# Patient Record
Sex: Female | Born: 1955
Health system: Southern US, Community
[De-identification: ages and names within clinical notes are randomized; demographics above are authoritative.]

## PROBLEM LIST (undated history)

## (undated) DIAGNOSIS — Z87828 Personal history of other (healed) physical injury and trauma: Secondary | ICD-10-CM

## (undated) DIAGNOSIS — E079 Disorder of thyroid, unspecified: Secondary | ICD-10-CM

## (undated) DIAGNOSIS — J9819 Other pulmonary collapse: Secondary | ICD-10-CM

## (undated) DIAGNOSIS — M25519 Pain in unspecified shoulder: Secondary | ICD-10-CM

## (undated) DIAGNOSIS — I10 Essential (primary) hypertension: Secondary | ICD-10-CM

## (undated) DIAGNOSIS — E119 Type 2 diabetes mellitus without complications: Secondary | ICD-10-CM

## (undated) DIAGNOSIS — Z972 Presence of dental prosthetic device (complete) (partial): Secondary | ICD-10-CM

## (undated) DIAGNOSIS — M1990 Unspecified osteoarthritis, unspecified site: Secondary | ICD-10-CM

## (undated) DIAGNOSIS — M545 Low back pain, unspecified: Secondary | ICD-10-CM

## (undated) DIAGNOSIS — K219 Gastro-esophageal reflux disease without esophagitis: Secondary | ICD-10-CM

## (undated) DIAGNOSIS — H269 Unspecified cataract: Secondary | ICD-10-CM

## (undated) DIAGNOSIS — E785 Hyperlipidemia, unspecified: Secondary | ICD-10-CM

## (undated) HISTORY — DX: Essential (primary) hypertension: I10

## (undated) HISTORY — DX: Low back pain, unspecified: M54.50

## (undated) HISTORY — PX: EYE SURGERY: SHX253

## (undated) HISTORY — DX: Personal history of other (healed) physical injury and trauma: Z87.828

## (undated) HISTORY — DX: Hyperlipidemia, unspecified: E78.5

## (undated) HISTORY — DX: Disorder of thyroid, unspecified: E07.9

## (undated) HISTORY — DX: Unspecified cataract: H26.9

## (undated) HISTORY — DX: Low back pain: M54.5

## (undated) HISTORY — PX: THYROID SURGERY: SHX805

## (undated) HISTORY — DX: Pain in unspecified shoulder: M25.519

## (undated) HISTORY — DX: Type 2 diabetes mellitus without complications: E11.9

## (undated) HISTORY — PX: BLADDER SURGERY: SHX569

## (undated) HISTORY — PX: CATARACT EXTRACTION, BILATERAL: SHX1313

## (undated) HISTORY — DX: Other pulmonary collapse: J98.19

---

## 2000-07-25 ENCOUNTER — Encounter: Payer: Self-pay | Admitting: Family Medicine

## 2004-12-01 ENCOUNTER — Ambulatory Visit: Payer: Self-pay | Admitting: Gastroenterology

## 2008-04-10 ENCOUNTER — Ambulatory Visit: Payer: Self-pay | Admitting: General Surgery

## 2009-08-06 ENCOUNTER — Ambulatory Visit: Payer: Self-pay | Admitting: Family Medicine

## 2009-09-07 ENCOUNTER — Ambulatory Visit: Payer: Self-pay | Admitting: Diagnostic Radiology

## 2009-09-07 ENCOUNTER — Emergency Department (HOSPITAL_BASED_OUTPATIENT_CLINIC_OR_DEPARTMENT_OTHER): Admission: EM | Admit: 2009-09-07 | Discharge: 2009-09-07 | Payer: Self-pay | Admitting: Emergency Medicine

## 2010-05-22 LAB — POCT CARDIAC MARKERS
Myoglobin, poc: 74.6 ng/mL (ref 12–200)
Troponin i, poc: <0.05 ng/mL (ref 0.00–0.09)

## 2010-05-22 LAB — BASIC METABOLIC PANEL
Calcium: 9 mg/dL (ref 8.4–10.5)
Creatinine, Ser: 0.6 mg/dL (ref 0.4–1.2)
GFR calc Af Amer: >60 mL/min (ref >60)
Glucose, Bld: 89 mg/dL (ref 70–99)
Potassium: 3.6 mEq/L (ref 3.5–5.1)
Sodium: 143 mEq/L (ref 135–145)

## 2010-05-22 LAB — CBC
HCT: 40.8 % (ref 36.0–46.0)
Hemoglobin: 13.4 g/dL (ref 12.0–15.0)
MCH: 28.4 pg (ref 26.0–34.0)
Platelets: 345 10*3/uL (ref 150–400)
RDW: 12.7 % (ref 11.5–15.5)

## 2010-05-22 LAB — DIFFERENTIAL
Basophils Absolute: 0.2 10*3/uL — ABNORMAL HIGH (ref 0.0–0.1)
Eosinophils Relative: 3 % (ref 0–5)
Lymphs Abs: 2.7 10*3/uL (ref 0.7–4.0)
Monocytes Relative: 7 % (ref 3–12)
Neutro Abs: 4.1 10*3/uL (ref 1.7–7.7)
Neutrophils Relative %: 53 % (ref 43–77)

## 2010-05-22 LAB — GLUCOSE, CAPILLARY: Glucose-Capillary: 71 mg/dL (ref 70–99)

## 2011-05-02 ENCOUNTER — Ambulatory Visit: Payer: Self-pay | Admitting: Family Medicine

## 2011-12-15 ENCOUNTER — Emergency Department: Payer: Self-pay | Admitting: Emergency Medicine

## 2011-12-16 LAB — COMPREHENSIVE METABOLIC PANEL
Alkaline Phosphatase: 99 U/L (ref 50–136)
BUN: 14 mg/dL (ref 7–18)
Bilirubin,Total: 0.3 mg/dL (ref 0.2–1.0)
Co2: 25 mmol/L (ref 21–32)
Creatinine: 0.61 mg/dL (ref 0.60–1.30)
EGFR (Non-African Amer.): >60
SGPT (ALT): 47 U/L (ref 12–78)
Total Protein: 7.6 g/dL (ref 6.4–8.2)

## 2011-12-16 LAB — CBC WITH DIFFERENTIAL/PLATELET
Basophil #: 0.1 10*3/uL (ref 0.0–0.1)
Basophil %: 0.6 %
HCT: 38.2 % (ref 35.0–47.0)
HGB: 12.6 g/dL (ref 12.0–16.0)
Lymphocyte #: 2.1 10*3/uL (ref 1.0–3.6)
Lymphocyte %: 9.3 %
MCV: 86 fL (ref 80–100)
Monocyte %: 4.3 %
Neutrophil #: 18.9 10*3/uL — ABNORMAL HIGH (ref 1.4–6.5)
RDW: 13.6 % (ref 11.5–14.5)
WBC: 22.1 10*3/uL — ABNORMAL HIGH (ref 3.6–11.0)

## 2012-01-03 ENCOUNTER — Encounter: Payer: Self-pay | Admitting: Internal Medicine

## 2012-01-05 ENCOUNTER — Encounter: Payer: Self-pay | Admitting: Internal Medicine

## 2012-03-28 ENCOUNTER — Encounter: Payer: Self-pay | Admitting: Neurosurgery

## 2012-06-19 DIAGNOSIS — M5416 Radiculopathy, lumbar region: Secondary | ICD-10-CM | POA: Insufficient documentation

## 2012-06-19 DIAGNOSIS — M5116 Intervertebral disc disorders with radiculopathy, lumbar region: Secondary | ICD-10-CM | POA: Insufficient documentation

## 2013-06-04 ENCOUNTER — Ambulatory Visit: Payer: Self-pay | Admitting: Family Medicine

## 2013-09-03 ENCOUNTER — Encounter: Payer: Self-pay | Admitting: *Deleted

## 2013-09-11 ENCOUNTER — Ambulatory Visit (INDEPENDENT_AMBULATORY_CARE_PROVIDER_SITE_OTHER): Payer: 59 | Admitting: Cardiovascular Disease

## 2013-09-11 ENCOUNTER — Encounter: Payer: Self-pay | Admitting: Cardiovascular Disease

## 2013-09-11 ENCOUNTER — Encounter (INDEPENDENT_AMBULATORY_CARE_PROVIDER_SITE_OTHER): Payer: Self-pay

## 2013-09-11 VITALS — BP 130/78 | HR 104 | Ht 62.0 in | Wt 264.2 lb

## 2013-09-11 DIAGNOSIS — E785 Hyperlipidemia, unspecified: Secondary | ICD-10-CM

## 2013-09-11 DIAGNOSIS — R0602 Shortness of breath: Secondary | ICD-10-CM

## 2013-09-11 DIAGNOSIS — R0989 Other specified symptoms and signs involving the circulatory and respiratory systems: Secondary | ICD-10-CM

## 2013-09-11 DIAGNOSIS — I10 Essential (primary) hypertension: Secondary | ICD-10-CM | POA: Insufficient documentation

## 2013-09-11 DIAGNOSIS — R0609 Other forms of dyspnea: Secondary | ICD-10-CM

## 2013-09-11 NOTE — Assessment & Plan Note (Signed)
Blood pressure is reasonably controlled. However, I will consider changes as outlined above.
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
## 2013-09-11 NOTE — Assessment & Plan Note (Signed)
It seems that she had issues with statins in the past. She might benefit from a trial of a different statin given history of diabetes and other risk factors.

## 2013-09-11 NOTE — Patient Instructions (Addendum)
Chelsea Gomez  Your caregiver has ordered a Stress Test with nuclear imaging. The purpose of this test is to evaluate the blood supply to your heart muscle. This procedure is referred to as a "Non-Invasive Stress Test." This is because other than having an IV started in your vein, nothing is inserted or "invades" your body. Cardiac stress tests are done to find areas of poor blood flow to the heart by determining the extent of coronary artery disease (CAD). Some patients exercise on a treadmill, which naturally increases the blood flow to your heart, while others who are  unable to walk on a treadmill due to physical limitations have a pharmacologic/chemical stress agent called Lexiscan . This medicine will mimic walking on a treadmill by temporarily increasing your coronary blood flow.   Please note: these test may take anywhere between 2-4 hours to complete  PLEASE REPORT TO [facility] AT THE FIRST DESK WILL DIRECT YOU WHERE TO GO  Date of Procedure:___________7/13/15__________________________  Arrival Time for Procedure:_______0815 am_______________________  Instructions regarding medication:   __x__ : Hold diabetes medication morning of procedure   PLEASE NOTIFY THE OFFICE AT LEAST 24 HOURS IN ADVANCE IF YOU ARE UNABLE TO KEEP YOUR APPOINTMENT.  [phone] AND  PLEASE NOTIFY NUCLEAR MEDICINE AT [facility] AT LEAST 24 HOURS IN ADVANCE IF YOU ARE UNABLE TO KEEP YOUR APPOINTMENT. [phone]  How to prepare for your Myoview test:  1. Do not eat or drink after midnight 2. No caffeine for 24 hours prior to test 3. No smoking 24 hours prior to test. 4. Your medication may be taken with water.  If your doctor stopped a medication because of this test, do not take that medication. 5. Ladies, please do not wear dresses.  Skirts or pants are appropriate. Please wear a short sleeve shirt. 6. No perfume, cologne or lotion. 7. Wear comfortable walking shoes. No  heels!    Your physician has requested that you have an echocardiogram. Echocardiography is a painless test that uses sound waves to create images of your heart. It provides your doctor with information about the size and shape of your heart and how well your heart's chambers and valves are working. This procedure takes approximately one hour. There are no restrictions for this procedure.   Your physician recommends that you schedule a follow-up appointment in:  After your tests

## 2013-09-11 NOTE — Assessment & Plan Note (Signed)
The patient is having dyspnea with minimal activities which is worrisome for angina equivalent given her multiple risk factors for coronary artery disease including diabetes mellitus. Baseline ECG is abnormal with sinus tachycardia and nonspecific T wave changes in the inferior leads. I recommend further ischemic evaluation with a pharmacologic nuclear stress. The other possibility could be diastolic heart failure. I requested an echocardiogram to evaluate diastolic function and pulmonary pressure. She might benefit from treatment with a beta blocker to improve sinus tachycardia. It might consider adding small dose carvedilol and decreasing amlodipine given her lower extremity edema.

## 2013-09-15 ENCOUNTER — Ambulatory Visit: Payer: Self-pay | Admitting: Cardiovascular Disease

## 2013-09-15 ENCOUNTER — Encounter: Payer: Self-pay | Admitting: Cardiovascular Disease

## 2013-09-15 DIAGNOSIS — R0609 Other forms of dyspnea: Secondary | ICD-10-CM

## 2013-09-15 DIAGNOSIS — R0989 Other specified symptoms and signs involving the circulatory and respiratory systems: Secondary | ICD-10-CM

## 2013-10-02 ENCOUNTER — Other Ambulatory Visit: Payer: Self-pay

## 2013-10-02 ENCOUNTER — Other Ambulatory Visit (INDEPENDENT_AMBULATORY_CARE_PROVIDER_SITE_OTHER): Payer: 59

## 2013-10-02 DIAGNOSIS — R0602 Shortness of breath: Secondary | ICD-10-CM

## 2013-10-13 ENCOUNTER — Encounter: Payer: Self-pay | Admitting: Cardiovascular Disease

## 2013-10-13 ENCOUNTER — Ambulatory Visit (INDEPENDENT_AMBULATORY_CARE_PROVIDER_SITE_OTHER): Payer: 59 | Admitting: Cardiovascular Disease

## 2013-10-13 VITALS — BP 141/84 | HR 99 | Ht 62.0 in | Wt 260.2 lb

## 2013-10-13 DIAGNOSIS — R0989 Other specified symptoms and signs involving the circulatory and respiratory systems: Secondary | ICD-10-CM

## 2013-10-13 DIAGNOSIS — R0609 Other forms of dyspnea: Secondary | ICD-10-CM

## 2013-10-13 DIAGNOSIS — I10 Essential (primary) hypertension: Secondary | ICD-10-CM

## 2013-10-13 MED ORDER — CARVEDILOL 6.25 MG PO TABS: 6.2500 mg | ORAL_TABLET | Freq: Two times a day (BID) | ORAL | Status: DC

## 2013-10-13 MED ORDER — AMLODIPINE BESYLATE 5 MG PO TABS: 5.0000 mg | ORAL_TABLET | Freq: Every day | ORAL | Status: DC

## 2013-10-13 NOTE — Patient Instructions (Signed)
Your physician has recommended you make the following change in your medication:  Decrease Amlodipine to 5 mg once daily  Start Carvedilol 6.25 mg twice daily   Your physician recommends that you schedule a follow-up appointment in:  3 months with Dr. Fletcher Anon

## 2013-10-13 NOTE — Assessment & Plan Note (Signed)
No evidence of ischemia on stress test and no evidence of structural cardiac abnormalities on echo. I suspect that her exertional dyspnea is likely multifactorial due to physical deconditioning, obesity and mild diastolic heart failure. I had a prolonged discussion with her about the importance of starting an exercise program and trying to lose weight. Evaluation for sleep apnea or so should be considered.

## 2013-10-13 NOTE — Assessment & Plan Note (Signed)
Given mild diastolic dysfunction on echo, she might benefit from slowing resting heart rate. Thus, I added small dose carvedilol 6.25 mg twice daily and decrease amlodipine to 5 mg once daily.
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
## 2014-01-13 ENCOUNTER — Ambulatory Visit: Payer: 59 | Admitting: Cardiovascular Disease

## 2014-01-21 ENCOUNTER — Ambulatory Visit (INDEPENDENT_AMBULATORY_CARE_PROVIDER_SITE_OTHER): Payer: 59 | Admitting: Cardiovascular Disease

## 2014-01-21 ENCOUNTER — Encounter: Payer: Self-pay | Admitting: Cardiovascular Disease

## 2014-01-21 VITALS — BP 156/90 | HR 100 | Ht 62.0 in | Wt 260.8 lb

## 2014-01-21 DIAGNOSIS — R0609 Other forms of dyspnea: Secondary | ICD-10-CM

## 2014-01-21 DIAGNOSIS — I10 Essential (primary) hypertension: Secondary | ICD-10-CM

## 2014-01-21 NOTE — Patient Instructions (Signed)
Continue same medications.   Your physician wants you to follow-up in: 6 months.  You will receive a reminder letter in the mail two months in advance. If you don't receive a letter, please call our office to schedule the follow-up appointment.  
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
## 2014-01-21 NOTE — Assessment & Plan Note (Signed)
Likely multifactorial due to physical deconditioning, obesity and mild diastolic heart failure. She reports improvement after the addition of carvedilol. I advised her to continue with weight loss and regular exercise.

## 2014-01-21 NOTE — Assessment & Plan Note (Signed)
Blood pressure is elevated today but she did not take her medications this morning. Continue to monitor.

## 2014-01-26 ENCOUNTER — Ambulatory Visit: Payer: 59 | Admitting: Cardiovascular Disease

## 2014-03-06 HISTORY — PX: TOOTH EXTRACTION: SUR596

## 2014-08-14 ENCOUNTER — Ambulatory Visit (INDEPENDENT_AMBULATORY_CARE_PROVIDER_SITE_OTHER): Payer: 59 | Admitting: Family Medicine

## 2014-08-14 ENCOUNTER — Encounter: Payer: Self-pay | Admitting: Family Medicine

## 2014-08-14 VITALS — BP 104/56 | HR 60 | Temp 98.2°F | Resp 16 | Ht 62.0 in | Wt 258.4 lb

## 2014-08-14 DIAGNOSIS — K219 Gastro-esophageal reflux disease without esophagitis: Secondary | ICD-10-CM | POA: Diagnosis not present

## 2014-08-14 DIAGNOSIS — I10 Essential (primary) hypertension: Secondary | ICD-10-CM | POA: Diagnosis not present

## 2014-08-14 DIAGNOSIS — E1165 Type 2 diabetes mellitus with hyperglycemia: Secondary | ICD-10-CM | POA: Diagnosis not present

## 2014-08-14 DIAGNOSIS — IMO0002 Reserved for concepts with insufficient information to code with codable children: Secondary | ICD-10-CM

## 2014-08-14 DIAGNOSIS — E785 Hyperlipidemia, unspecified: Secondary | ICD-10-CM

## 2014-08-14 LAB — POCT GLYCOSYLATED HEMOGLOBIN (HGB A1C): Hemoglobin A1C: 7.9

## 2014-08-14 MED ORDER — ATORVASTATIN CALCIUM 40 MG PO TABS: 40.0000 mg | ORAL_TABLET | Freq: Every day | ORAL | Status: DC
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
## 2014-08-14 NOTE — Patient Instructions (Addendum)
Try elevating the Head of your bed on 6 inch blocks. Stop Zetia Try to walk daily and improve your endurance Look up Byetta on the internet

## 2014-10-07 ENCOUNTER — Other Ambulatory Visit: Payer: Self-pay | Admitting: Family Medicine

## 2014-10-07 DIAGNOSIS — I10 Essential (primary) hypertension: Secondary | ICD-10-CM

## 2014-11-12 ENCOUNTER — Other Ambulatory Visit: Payer: Self-pay | Admitting: Family Medicine

## 2014-11-12 DIAGNOSIS — E119 Type 2 diabetes mellitus without complications: Secondary | ICD-10-CM

## 2014-11-12 MED ORDER — JANUVIA 100 MG PO TABS: 100.0000 mg | ORAL_TABLET | Freq: Every day | ORAL | Status: DC

## 2014-11-13 DIAGNOSIS — R0681 Apnea, not elsewhere classified: Secondary | ICD-10-CM | POA: Insufficient documentation

## 2014-11-13 DIAGNOSIS — E1165 Type 2 diabetes mellitus with hyperglycemia: Secondary | ICD-10-CM | POA: Insufficient documentation

## 2014-11-13 DIAGNOSIS — Z87898 Personal history of other specified conditions: Secondary | ICD-10-CM | POA: Insufficient documentation

## 2014-11-16 ENCOUNTER — Encounter: Payer: Self-pay | Admitting: Family Medicine

## 2014-11-16 ENCOUNTER — Ambulatory Visit (INDEPENDENT_AMBULATORY_CARE_PROVIDER_SITE_OTHER): Payer: 59 | Admitting: Family Medicine

## 2014-11-16 VITALS — BP 130/68 | HR 78 | Temp 98.4°F | Resp 16 | Wt 259.0 lb

## 2014-11-16 DIAGNOSIS — E119 Type 2 diabetes mellitus without complications: Secondary | ICD-10-CM

## 2014-11-16 DIAGNOSIS — E785 Hyperlipidemia, unspecified: Secondary | ICD-10-CM

## 2014-11-16 DIAGNOSIS — I10 Essential (primary) hypertension: Secondary | ICD-10-CM | POA: Diagnosis not present

## 2014-11-16 LAB — POCT GLYCOSYLATED HEMOGLOBIN (HGB A1C): HEMOGLOBIN A1C: 8

## 2014-11-16 MED ORDER — CARVEDILOL 6.25 MG PO TABS: 6.2500 mg | ORAL_TABLET | Freq: Two times a day (BID) | ORAL | Status: DC

## 2014-11-16 MED ORDER — AMLODIPINE BESYLATE 5 MG PO TABS: 5.0000 mg | ORAL_TABLET | Freq: Every day | ORAL | Status: DC

## 2014-11-16 MED ORDER — PRAVASTATIN SODIUM 80 MG PO TABS: 80.0000 mg | ORAL_TABLET | Freq: Every day | ORAL | Status: DC

## 2014-11-16 NOTE — Patient Instructions (Signed)
Please exercise more. We will check your cholesterol when you return if you are taking/tolerated pravastatin.

## 2014-11-16 NOTE — Progress Notes (Signed)
Subjective:     Patient ID: Chelsea Gomez, female   DOB: 15-Mar-1955, 59 y.o.   MRN: [id number]  HPI  Chief Complaint  Patient presents with  . Diabetes    Patient is present in office today for follow up from 08/14/14, last hgbA1C was 7.9%. At last officve visit we discussed considering adding Byeta, patient reports good compliance and tolerance of blood sugar. Patient denies any hypoglycemia incidents and states that she is checking her feet for wounds and sores.   . Hyperlipidemia    Follow up from 08/14/14, patient was started on Atorvastatin 40mg  qd. Patient reports poor compliance of medication she states that she did not pick up prescription from pharmacy because of cost.   . Hypertension    Follow up from 08/14/14, blood pressure last office visit was 104/56, she reports good compliance and tolerance of medication.  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 Reports she is going to retire by the end of the year. 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 Wishes refills on bp medication.   Review of Systems  Respiratory: Negative for shortness of breath.   Cardiovascular: Negative for chest pain and palpitations.  Genitourinary:       Pending appointment with [facility].       Objective:   Physical Exam  Constitutional: She appears well-developed and well-nourished. No distress.  Lungs: clear Heart: RRR without murmur Lower extremities: no edema     Assessment:    1. Type 2 diabetes mellitus without complication - POCT glycosylated hemoglobin (Hb A1C)  2. Hyperlipidemia - pravastatin (PRAVACHOL) 80 MG tablet; Take 1 tablet (80 mg total) by mouth daily.  Dispense: 30 tablet; Refill: 2  3. Essential hypertension - carvedilol (COREG) 6.25 MG tablet; Take 1 tablet (6.25 mg total) by mouth 2 (two) times daily.  Dispense: 180 tablet; Refill: 3 - amLODipine (NORVASC) 5 MG tablet; Take 1 tablet (5 mg  total) by mouth daily.  Dispense: 90 tablet; Refill: 3    Plan:    Encouraged regular exercise and discussed options if D.M not under control: change to Byetta, add insulin, or endocrine referral. F/u in 3 months.

## 2014-12-01 ENCOUNTER — Telehealth: Payer: Self-pay | Admitting: *Deleted

## 2014-12-01 NOTE — Telephone Encounter (Signed)
lmov to schedule apt for OD 6 m fu with Dr Fletcher Anon

## 2014-12-15 ENCOUNTER — Other Ambulatory Visit: Payer: Self-pay | Admitting: Family Medicine

## 2014-12-15 DIAGNOSIS — E119 Type 2 diabetes mellitus without complications: Secondary | ICD-10-CM

## 2014-12-25 NOTE — Telephone Encounter (Signed)
3 attempts made to schedule OD fu with patient .  LMOV to call office to schedule.     Deleting Recall.

## 2015-02-15 ENCOUNTER — Ambulatory Visit (INDEPENDENT_AMBULATORY_CARE_PROVIDER_SITE_OTHER): Payer: 59 | Admitting: Family Medicine

## 2015-02-15 ENCOUNTER — Encounter: Payer: Self-pay | Admitting: Family Medicine

## 2015-02-15 VITALS — BP 132/76 | HR 80 | Temp 98.0°F | Resp 16 | Wt 255.2 lb

## 2015-02-15 DIAGNOSIS — I10 Essential (primary) hypertension: Secondary | ICD-10-CM

## 2015-02-15 DIAGNOSIS — E119 Type 2 diabetes mellitus without complications: Secondary | ICD-10-CM | POA: Diagnosis not present

## 2015-02-15 LAB — POCT GLYCOSYLATED HEMOGLOBIN (HGB A1C): Hemoglobin A1C: 7.7

## 2015-02-15 MED ORDER — GLUCOSE BLOOD VI STRP: ORAL_STRIP | Status: DC

## 2015-02-15 MED ORDER — BLOOD GLUCOSE MONITOR KIT
PACK | Status: DC
Start: 2015-02-15 — End: 2018-11-06

## 2015-02-15 NOTE — Progress Notes (Signed)
Subjective:     Patient ID: Chelsea Gomez, female   DOB: January 03, 1956, 59 y.o.   MRN: [id number]  HPI  Chief Complaint  Patient presents with  . Diabetes    Follow up from 11/16/14, HgbA1C in house was 8.0%. Encouraged patient to do regular exercise and discussed options if diabetes is ot under control, change to Byetta and add insulin or Endocrine referral. Patient has been compliant and tolerating medication well, she is not currently checking blood sugar to.   . Hyperlipidemia    Follow up from 11/16/14, patient was started on Pravastatin 80mg  qd  . Hypertension    Follow up from 11/16/14, encouraged patient to continue Carvedilol 6.25mg  and Amlodipine 5mg . Blood pressure last visit was 130/68  States she has started using her stair stepper exercise machine 4-5 minutes daily. "I will be retiring in 2 weeks!" States she may also help her sister caregive another sister with Alzheimer's.   Review of Systems  Constitutional:       Defers the flu vaccine  HENT:       Pending eye exam with Dr. Matilde Sprang.  Respiratory: Positive for shortness of breath (felt to be due to deconditioning. Has had prior cardiac evaluation).   Cardiovascular: Negative for chest pain and palpitations.  Genitourinary:       States she has had her annual physical at [facility].       Objective:   Physical Exam  Constitutional: She appears well-developed and well-nourished. No distress.  Cardiovascular: Normal rate and regular rhythm.   Pulmonary/Chest: Breath sounds normal.  Musculoskeletal: She exhibits no edema (of lower extremities).       Assessment:    1. Type 2 diabetes mellitus without complication, without long-term current use of insulin ([place]): Improving. Will provide with glucometer so she can record her readings.  - POCT glycosylated hemoglobin (Hb A1C) - glucose blood test strip; Twice weekly fasting and 2 hours after a meal.  Dispense: 100 each; Refill: 3  2. Essential hypertension     Plan:   Update lab work at next visit

## 2015-02-15 NOTE — Patient Instructions (Addendum)
Continue daily exercise-your weight and sugar are improving! Work up gradually to 30 minutes a day. Keep record of your sugars for my review. Please make appointment with Dr.Nice.

## 2015-03-21 ENCOUNTER — Other Ambulatory Visit: Payer: Self-pay | Admitting: Family Medicine

## 2015-05-17 ENCOUNTER — Ambulatory Visit
Admission: RE | Admit: 2015-05-17 | Discharge: 2015-05-17 | Disposition: A | Discharge status: Home / Self Care | Payer: 59 | Source: Ambulatory Visit | Attending: Family Medicine | Admitting: Family Medicine

## 2015-05-17 ENCOUNTER — Encounter: Payer: Self-pay | Admitting: Family Medicine

## 2015-05-17 ENCOUNTER — Ambulatory Visit (INDEPENDENT_AMBULATORY_CARE_PROVIDER_SITE_OTHER): Payer: 59 | Admitting: Family Medicine

## 2015-05-17 VITALS — BP 132/82 | HR 88 | Temp 98.8°F | Resp 16 | Wt 252.6 lb

## 2015-05-17 DIAGNOSIS — E785 Hyperlipidemia, unspecified: Secondary | ICD-10-CM

## 2015-05-17 DIAGNOSIS — M545 Low back pain, unspecified: Secondary | ICD-10-CM

## 2015-05-17 DIAGNOSIS — I10 Essential (primary) hypertension: Secondary | ICD-10-CM | POA: Diagnosis not present

## 2015-05-17 DIAGNOSIS — M5136 Other intervertebral disc degeneration, lumbar region: Secondary | ICD-10-CM | POA: Insufficient documentation

## 2015-05-17 DIAGNOSIS — H9190 Unspecified hearing loss, unspecified ear: Secondary | ICD-10-CM

## 2015-05-17 DIAGNOSIS — E119 Type 2 diabetes mellitus without complications: Secondary | ICD-10-CM

## 2015-05-17 LAB — POCT GLYCOSYLATED HEMOGLOBIN (HGB A1C): Hemoglobin A1C: 8.6
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
## 2015-05-17 NOTE — Patient Instructions (Signed)
We will call you with lab results and discuss further diabetic medication.

## 2015-05-18 ENCOUNTER — Other Ambulatory Visit: Payer: Self-pay | Admitting: Family Medicine

## 2015-05-18 DIAGNOSIS — E119 Type 2 diabetes mellitus without complications: Secondary | ICD-10-CM

## 2015-05-18 LAB — COMPREHENSIVE METABOLIC PANEL
ALBUMIN: 4.4 g/dL (ref 3.6–4.8)
ALK PHOS: 81 IU/L (ref 39–117)
ALT: 17 IU/L (ref 0–32)
AST: 20 IU/L (ref 0–40)
Albumin/Globulin Ratio: 1.4 (ref 1.2–2.2)
BUN / CREAT RATIO: 15 (ref 11–26)
BUN: 8 mg/dL (ref 8–27)
Bilirubin Total: 0.4 mg/dL (ref 0.0–1.2)
CALCIUM: 9.7 mg/dL (ref 8.7–10.3)
CO2: 26 mmol/L (ref 18–29)
CREATININE: 0.54 mg/dL — AB (ref 0.57–1.00)
Chloride: 95 mmol/L — ABNORMAL LOW (ref 96–106)
GFR calc Af Amer: 119 mL/min/{1.73_m2} (ref >59)
GFR calc non Af Amer: 103 mL/min/{1.73_m2} (ref >59)
GLUCOSE: 214 mg/dL — AB (ref 65–99)
Globulin, Total: 3.2 g/dL (ref 1.5–4.5)
Potassium: 3.7 mmol/L (ref 3.5–5.2)
Sodium: 140 mmol/L (ref 134–144)
Total Protein: 7.6 g/dL (ref 6.0–8.5)

## 2015-05-18 LAB — LIPID PANEL
CHOLESTEROL TOTAL: 159 mg/dL (ref 100–199)
Chol/HDL Ratio: 4 ratio units (ref 0.0–4.4)
HDL: 40 mg/dL (ref >39)
LDL CALC: 93 mg/dL (ref 0–99)
TRIGLYCERIDES: 128 mg/dL (ref 0–149)
VLDL CHOLESTEROL CAL: 26 mg/dL (ref 5–40)

## 2015-05-18 MED ORDER — CANAGLIFLOZIN 100 MG PO TABS
100.0000 mg | ORAL_TABLET | Freq: Every day | ORAL | Status: DC
Start: 2015-05-18 — End: 2015-06-15

## 2015-05-25 ENCOUNTER — Telehealth: Payer: Self-pay | Admitting: Family Medicine

## 2015-05-25 NOTE — Telephone Encounter (Signed)
Spoke with patient on the phone who states that she has been notified about her appt. KW

## 2015-05-25 NOTE — Telephone Encounter (Signed)
Pt said some one tried to call her and she returned the call.  Didn't see a telephone message open.   Thanks [employer]

## 2015-06-14 ENCOUNTER — Telehealth: Payer: Self-pay | Admitting: Family Medicine

## 2015-06-14 NOTE — Telephone Encounter (Signed)
Refill request. KW 

## 2015-06-14 NOTE — Telephone Encounter (Signed)
Pt contacted office for refill request on the following medications: canagliflozin (INVOKANA) 100 MG TABS tablet to [facility]. Pt stated that the medication seems to be doing what it is supposed to do and wanted to know if she should continue taking the medication b/c there wasn't any refills on the RX that was sent on 05/18/15. Please advise. Thanks TNP

## 2015-06-14 NOTE — Telephone Encounter (Signed)
Refill request

## 2015-06-15 ENCOUNTER — Other Ambulatory Visit: Payer: Self-pay | Admitting: Family Medicine

## 2015-06-15 DIAGNOSIS — E119 Type 2 diabetes mellitus without complications: Secondary | ICD-10-CM

## 2015-06-15 MED ORDER — CANAGLIFLOZIN 100 MG PO TABS
100.0000 mg | ORAL_TABLET | Freq: Every day | ORAL | Status: DC
Start: 2015-06-15 — End: 2015-12-20

## 2015-06-15 NOTE — Telephone Encounter (Signed)
I have refilled Invokana.  See you in two months if you continue to tolerate it.

## 2015-06-15 NOTE — Telephone Encounter (Signed)
[id number] 

## 2015-06-17 ENCOUNTER — Encounter: Payer: Self-pay | Admitting: Family Medicine

## 2015-06-17 NOTE — Telephone Encounter (Signed)
Patient has been advised. KW 

## 2015-06-30 ENCOUNTER — Other Ambulatory Visit: Payer: Self-pay | Admitting: Family Medicine

## 2015-08-17 ENCOUNTER — Ambulatory Visit (INDEPENDENT_AMBULATORY_CARE_PROVIDER_SITE_OTHER): Payer: 59 | Admitting: Family Medicine

## 2015-08-17 VITALS — BP 130/80 | HR 70 | Temp 97.9°F | Resp 16 | Ht 62.0 in | Wt 237.8 lb

## 2015-08-17 DIAGNOSIS — H6982 Other specified disorders of Eustachian tube, left ear: Secondary | ICD-10-CM | POA: Diagnosis not present

## 2015-08-17 DIAGNOSIS — Z1159 Encounter for screening for other viral diseases: Secondary | ICD-10-CM

## 2015-08-17 DIAGNOSIS — E119 Type 2 diabetes mellitus without complications: Secondary | ICD-10-CM

## 2015-08-17 NOTE — Patient Instructions (Signed)
If your ear does not improve on its own-try an over the counter steroid nasal spray like Flonase.

## 2015-08-17 NOTE — Progress Notes (Signed)
Subjective:     Patient ID: Chelsea Gomez, female   DOB: 03-11-1955, 60 y.o.   MRN: [id number]  HPI  Chief Complaint  Patient presents with  . Diabetes    3 month follow up   Reports tolerating Invokana though has increased frequency of urination. She has lost 15# over the last 3 months. Occasionally using her stair stepper. States she keeps up with her fluids with water and Gatorade. Reports getting her hearing checked and was felt to be ok. Reports a low humming in her left ear and wishes that checked. States she has mild residual sinus congestion from earlier in the allergy season. Brings a copy of [facility] screening from 06/07/15: normal ABI's, no AAA.  Review of Systems  Respiratory: Negative for shortness of breath.   Cardiovascular: Negative for chest pain and palpitations.       Objective:   Physical Exam  Constitutional: She appears well-developed and well-nourished. No distress.  Lungs: clear Heart: RRR without murmur Lower extremities: no edema; pedal pulses intact, sensation to monofilament intact, no wounds noted. Ears: TM's dull with diminished light reflex.    Assessment:    1. Type 2 diabetes mellitus without complication, without long-term current use of insulin (HCC) - Renal function panel - HgB A1c  2. Need for hepatitis C screening test - Hepatitis C Antibody  3. Eustachian tube dysfunction, left    Plan:    Discussed use of otc steroid nasal spray. May try Invokana qod and see if urinary frequency more tolerable. Further f/u pending lab work.

## 2015-08-18 LAB — HEMOGLOBIN A1C
ESTIMATED AVERAGE GLUCOSE: 180 mg/dL
HEMOGLOBIN A1C: 7.9 % — AB (ref 4.8–5.6)

## 2015-08-18 LAB — RENAL FUNCTION PANEL
ALBUMIN: 4.5 g/dL (ref 3.6–4.8)
BUN/Creatinine Ratio: 15 (ref 12–28)
BUN: 9 mg/dL (ref 8–27)
CALCIUM: 9.9 mg/dL (ref 8.7–10.3)
CO2: 27 mmol/L (ref 18–29)
CREATININE: 0.59 mg/dL (ref 0.57–1.00)
Chloride: 99 mmol/L (ref 96–106)
GFR calc Af Amer: 115 mL/min/{1.73_m2} (ref >59)
GFR calc non Af Amer: 100 mL/min/{1.73_m2} (ref >59)
Glucose: 146 mg/dL — ABNORMAL HIGH (ref 65–99)
PHOSPHORUS: 4 mg/dL (ref 2.5–4.5)
Potassium: 4.2 mmol/L (ref 3.5–5.2)
SODIUM: 144 mmol/L (ref 134–144)

## 2015-08-18 LAB — HEPATITIS C ANTIBODY

## 2015-08-19 ENCOUNTER — Telehealth: Payer: Self-pay | Admitting: Family Medicine

## 2015-08-19 NOTE — Telephone Encounter (Signed)
There is no note added to the labs, please review-aa

## 2015-08-19 NOTE — Telephone Encounter (Signed)
Pt called saying someone called her about her labs yesterday,  Her call backis [phone]  Thanks. teri

## 2015-10-17 ENCOUNTER — Other Ambulatory Visit: Payer: Self-pay | Admitting: Family Medicine

## 2015-10-17 DIAGNOSIS — I10 Essential (primary) hypertension: Secondary | ICD-10-CM

## 2015-11-17 ENCOUNTER — Ambulatory Visit (INDEPENDENT_AMBULATORY_CARE_PROVIDER_SITE_OTHER): Payer: 59 | Admitting: Family Medicine

## 2015-11-17 ENCOUNTER — Encounter: Payer: Self-pay | Admitting: Family Medicine

## 2015-11-17 VITALS — BP 114/70 | HR 64 | Temp 98.1°F | Resp 15 | Wt 236.0 lb

## 2015-11-17 DIAGNOSIS — E119 Type 2 diabetes mellitus without complications: Secondary | ICD-10-CM | POA: Diagnosis not present

## 2015-11-17 LAB — POCT GLYCOSYLATED HEMOGLOBIN (HGB A1C): Hemoglobin A1C: 7.1

## 2015-11-17 NOTE — Patient Instructions (Signed)
If urinary frequency is bothering you too much try the Invokana every other day.

## 2015-11-17 NOTE — Progress Notes (Signed)
Subjective:     Patient ID: Chelsea Gomez, female   DOB: 06/30/1955, 60 y.o.   MRN: [id number]  HPI  Chief Complaint  Patient presents with  . Diabetes    Patient comes in office today for 3 moth follow up. Last hgbA1C was 7.9% patient reports good compliance, tolerance and symptom control on medication  States she is tolerating Invokana better but still has increased urinary urge. Did not try every other day so did not resume glipizide. Weight is down 1#. Has had health maintenance recently with gyn.   Review of Systems  Respiratory: Negative for shortness of breath.   Cardiovascular: Negative for chest pain and palpitations.       Objective:   Physical Exam  Constitutional: She appears well-developed and well-nourished. No distress.  Cardiovascular: Normal rate and regular rhythm.   Pulmonary/Chest: Breath sounds normal.  Musculoskeletal: She exhibits no edema (of lower extremities).       Assessment:    1. Type 2 diabetes mellitus without complication, without long-term current use of insulin (HCC) - POCT glycosylated hemoglobin (Hb A1C)    Plan:    May take Invokana every other day if urinary issues worsen. Will update renal profile at next office visit.

## 2015-12-20 ENCOUNTER — Other Ambulatory Visit: Payer: Self-pay | Admitting: Family Medicine

## 2015-12-20 DIAGNOSIS — I10 Essential (primary) hypertension: Secondary | ICD-10-CM

## 2015-12-20 DIAGNOSIS — E119 Type 2 diabetes mellitus without complications: Secondary | ICD-10-CM

## 2016-01-19 ENCOUNTER — Other Ambulatory Visit: Payer: Self-pay | Admitting: Family Medicine

## 2016-01-19 DIAGNOSIS — E119 Type 2 diabetes mellitus without complications: Secondary | ICD-10-CM

## 2016-02-16 ENCOUNTER — Encounter: Payer: Self-pay | Admitting: Family Medicine

## 2016-02-16 ENCOUNTER — Ambulatory Visit (INDEPENDENT_AMBULATORY_CARE_PROVIDER_SITE_OTHER): Payer: 59 | Admitting: Family Medicine

## 2016-02-16 VITALS — BP 130/72 | HR 60 | Temp 97.8°F | Resp 16 | Wt 238.2 lb

## 2016-02-16 DIAGNOSIS — E119 Type 2 diabetes mellitus without complications: Secondary | ICD-10-CM

## 2016-02-16 LAB — POCT GLYCOSYLATED HEMOGLOBIN (HGB A1C)

## 2016-02-16 MED ORDER — GLIPIZIDE 5 MG PO TABS: 5.0000 mg | ORAL_TABLET | Freq: Two times a day (BID) | ORAL | 0 refills | Status: DC

## 2016-02-16 NOTE — Progress Notes (Addendum)
Subjective:     Patient ID: Chelsea Gomez, female   DOB: 1955/10/21, 60 y.o.   MRN: [id number]  HPI  Chief Complaint  Patient presents with  . Diabetes    Patient comes in office today for three month follow up, patients last hgb A1C on 11/17/15 was 7.1%. Patient reports that she is not eating a well balance diet but watching foors hgh in sugar, patient is not actively been exercising. She reports good compliance and tolerance on medication, she denies any hyperglycemia incidents and has been inspecting her feet for wounds and sores. Patient states that she is taking Invokana every other day.   States urinary urgency improved with qod usage of Invokana. Reports she is not exercising at this time. Occasionally works part-time at her old job. Refuses all immunizations at this time.   Review of Systems  Respiratory: Negative for shortness of breath.   Cardiovascular: Negative for chest pain and palpitations.       Objective:   Physical Exam  Constitutional: She appears well-developed and well-nourished. No distress.  Cardiovascular: Normal rate and regular rhythm.   Pulmonary/Chest: Breath sounds normal.  Musculoskeletal: She exhibits no edema (of lower extremities).       Assessment:    1. Type 2 diabetes mellitus without complication, without long-term current use of insulin (HCC) - POCT glycosylated hemoglobin (Hb A1C) - glipiZIDE (GLUCOTROL) 5 MG tablet; Take 1 tablet (5 mg total) by mouth 2 (two) times daily before a meal.  Dispense: 180 tablet; Refill: 0 - Renal function panel    Plan:    Encouraged walking program.

## 2016-02-16 NOTE — Patient Instructions (Addendum)
Encouraged walking program 30 minutes daily. We will call you with the lab results.

## 2016-02-17 ENCOUNTER — Telehealth: Payer: Self-pay

## 2016-02-17 LAB — RENAL FUNCTION PANEL
ALBUMIN: 4.5 g/dL (ref 3.6–4.8)
BUN/Creatinine Ratio: 19 (ref 12–28)
BUN: 10 mg/dL (ref 8–27)
CHLORIDE: 97 mmol/L (ref 96–106)
CO2: 24 mmol/L (ref 18–29)
Calcium: 9.5 mg/dL (ref 8.7–10.3)
Creatinine, Ser: 0.52 mg/dL — ABNORMAL LOW (ref 0.57–1.00)
GFR calc Af Amer: 119 mL/min/{1.73_m2} (ref >59)
GFR, EST NON AFRICAN AMERICAN: 103 mL/min/{1.73_m2} (ref >59)
GLUCOSE: 182 mg/dL — AB (ref 65–99)
PHOSPHORUS: 3.5 mg/dL (ref 2.5–4.5)
POTASSIUM: 3.8 mmol/L (ref 3.5–5.2)
Sodium: 141 mmol/L (ref 134–144)

## 2016-02-17 NOTE — Telephone Encounter (Signed)
-----   Message from Carmon Ginsberg, [place] sent at 02/17/2016  7:33 AM EST ----- Renal function remains excellent

## 2016-02-17 NOTE — Telephone Encounter (Signed)
Patient has been advised. KW 

## 2016-02-22 ENCOUNTER — Encounter: Payer: Self-pay | Admitting: Family Medicine

## 2016-04-09 ENCOUNTER — Other Ambulatory Visit: Payer: Self-pay | Admitting: Family Medicine

## 2016-05-17 ENCOUNTER — Encounter: Payer: Self-pay | Admitting: Family Medicine

## 2016-05-17 ENCOUNTER — Ambulatory Visit (INDEPENDENT_AMBULATORY_CARE_PROVIDER_SITE_OTHER): Payer: 59 | Admitting: Family Medicine

## 2016-05-17 VITALS — BP 110/60 | HR 77 | Temp 98.3°F | Resp 16 | Wt 241.2 lb

## 2016-05-17 DIAGNOSIS — I10 Essential (primary) hypertension: Secondary | ICD-10-CM

## 2016-05-17 DIAGNOSIS — E119 Type 2 diabetes mellitus without complications: Secondary | ICD-10-CM

## 2016-05-17 LAB — POCT GLYCOSYLATED HEMOGLOBIN (HGB A1C)

## 2016-05-17 MED ORDER — GLIPIZIDE 10 MG PO TABS: 10.0000 mg | ORAL_TABLET | Freq: Two times a day (BID) | ORAL | 1 refills | Status: DC
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
## 2016-05-17 NOTE — Patient Instructions (Signed)
Encourage regular exercise on your stair stepper or walking. Schedule an eye exam this year.

## 2016-06-08 DIAGNOSIS — N870 Mild cervical dysplasia: Secondary | ICD-10-CM | POA: Diagnosis not present

## 2016-06-08 DIAGNOSIS — R87610 Atypical squamous cells of undetermined significance on cytologic smear of cervix (ASC-US): Secondary | ICD-10-CM | POA: Diagnosis not present

## 2016-08-17 ENCOUNTER — Encounter: Payer: Self-pay | Admitting: Family Medicine

## 2016-08-17 ENCOUNTER — Ambulatory Visit (INDEPENDENT_AMBULATORY_CARE_PROVIDER_SITE_OTHER): Payer: 59 | Admitting: Family Medicine

## 2016-08-17 VITALS — BP 128/82 | HR 84 | Temp 98.6°F | Resp 16 | Wt 242.0 lb

## 2016-08-17 DIAGNOSIS — I10 Essential (primary) hypertension: Secondary | ICD-10-CM | POA: Diagnosis not present

## 2016-08-17 DIAGNOSIS — E119 Type 2 diabetes mellitus without complications: Secondary | ICD-10-CM | POA: Diagnosis not present

## 2016-08-17 DIAGNOSIS — E782 Mixed hyperlipidemia: Secondary | ICD-10-CM | POA: Diagnosis not present

## 2016-08-17 LAB — POCT GLYCOSYLATED HEMOGLOBIN (HGB A1C): Hemoglobin A1C: 7.9
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
## 2016-08-17 NOTE — Patient Instructions (Signed)
Do get an eye exam this year. Try to exercise daily up to 30 minutes of walking or stair stepper.

## 2016-08-18 ENCOUNTER — Telehealth: Payer: Self-pay

## 2016-08-18 LAB — COMPREHENSIVE METABOLIC PANEL
ALBUMIN: 4.6 g/dL (ref 3.6–4.8)
ALK PHOS: 85 IU/L (ref 39–117)
ALT: 11 IU/L (ref 0–32)
AST: 16 IU/L (ref 0–40)
Albumin/Globulin Ratio: 1.4 (ref 1.2–2.2)
BUN / CREAT RATIO: 16 (ref 12–28)
BUN: 8 mg/dL (ref 8–27)
Bilirubin Total: 0.5 mg/dL (ref 0.0–1.2)
CALCIUM: 9.8 mg/dL (ref 8.7–10.3)
CO2: 27 mmol/L (ref 20–29)
Chloride: 96 mmol/L (ref 96–106)
Creatinine, Ser: 0.51 mg/dL — ABNORMAL LOW (ref 0.57–1.00)
GFR calc Af Amer: 119 mL/min/{1.73_m2} (ref >59)
GFR calc non Af Amer: 103 mL/min/{1.73_m2} (ref >59)
GLUCOSE: 164 mg/dL — AB (ref 65–99)
Globulin, Total: 3.3 g/dL (ref 1.5–4.5)
Potassium: 3.9 mmol/L (ref 3.5–5.2)
Sodium: 140 mmol/L (ref 134–144)
Total Protein: 7.9 g/dL (ref 6.0–8.5)

## 2016-08-18 LAB — LIPID PANEL
Chol/HDL Ratio: 4 ratio (ref 0.0–4.4)
Cholesterol, Total: 170 mg/dL (ref 100–199)
HDL: 43 mg/dL (ref >39)
LDL Calculated: 105 mg/dL — ABNORMAL HIGH (ref 0–99)
Triglycerides: 108 mg/dL (ref 0–149)
VLDL CHOLESTEROL CAL: 22 mg/dL (ref 5–40)

## 2016-08-18 NOTE — Telephone Encounter (Signed)
-----   Message from Carmon Ginsberg, [place] sent at 08/18/2016  7:34 AM EDT ----- Labs ok except for elevated sugar which we discussed and mildly higher cholesterol. Exercise will help!!

## 2016-08-18 NOTE — Telephone Encounter (Signed)
Patient has been advised. KW 

## 2016-08-30 ENCOUNTER — Encounter: Payer: Self-pay | Admitting: Family Medicine

## 2016-09-12 ENCOUNTER — Encounter: Payer: Self-pay | Admitting: Family Medicine

## 2016-10-18 ENCOUNTER — Other Ambulatory Visit: Payer: Self-pay | Admitting: Family Medicine

## 2016-10-18 DIAGNOSIS — I10 Essential (primary) hypertension: Secondary | ICD-10-CM

## 2016-11-02 DIAGNOSIS — N870 Mild cervical dysplasia: Secondary | ICD-10-CM | POA: Diagnosis not present

## 2016-11-02 DIAGNOSIS — R87610 Atypical squamous cells of undetermined significance on cytologic smear of cervix (ASC-US): Secondary | ICD-10-CM | POA: Diagnosis not present

## 2016-11-15 ENCOUNTER — Encounter: Payer: Self-pay | Admitting: Family Medicine

## 2016-11-17 ENCOUNTER — Ambulatory Visit: Payer: 59 | Admitting: Family Medicine

## 2016-11-23 ENCOUNTER — Encounter: Payer: Self-pay | Admitting: Family Medicine

## 2016-11-23 ENCOUNTER — Ambulatory Visit (INDEPENDENT_AMBULATORY_CARE_PROVIDER_SITE_OTHER): Payer: 59 | Admitting: Family Medicine

## 2016-11-23 VITALS — BP 122/86 | HR 83 | Temp 98.2°F | Resp 16 | Wt 245.4 lb

## 2016-11-23 DIAGNOSIS — I10 Essential (primary) hypertension: Secondary | ICD-10-CM | POA: Diagnosis not present

## 2016-11-23 DIAGNOSIS — M79672 Pain in left foot: Secondary | ICD-10-CM

## 2016-11-23 DIAGNOSIS — E119 Type 2 diabetes mellitus without complications: Secondary | ICD-10-CM

## 2016-11-23 LAB — POCT GLYCOSYLATED HEMOGLOBIN (HGB A1C): HEMOGLOBIN A1C: 8.7

## 2016-11-23 NOTE — Progress Notes (Addendum)
Subjective:     Patient ID: Chelsea Gomez, female   DOB: 03/16/1955, 61 y.o.   MRN: [id number]  HPI  Chief Complaint  Patient presents with  . Hypertension    Follow up from 08/17/16 patients blood pressure at las visit was 128/82. Patient reports good compliance and tolerance on medication  . Hyperlipidemia    Follow up from 08/17/16  . Diabetes    Follow up from 08/17/16 HgbA1C was 7.9%, patient reports good compliance and tolerance on medication. Patient reports that she is eating healthy but is not actively exercising, patient denies any hypoglycemia incidents or changes to her feet.   States she has been neglecting her health as her daughter was hospitalized for depression. She has been attending support groups. Reports occasional sharp foot pain on the bottom of her left foot-"like may arch has fallen." States her current shoes do not have new insoles.Defers flu vaccine.   Review of Systems  Respiratory: Negative for shortness of breath.   Cardiovascular: Negative for chest pain and palpitations.       Objective:   Physical Exam  Constitutional: She appears well-developed and well-nourished. No distress.  Cardiovascular: Normal rate and regular rhythm.   Pulmonary/Chest: Breath sounds normal.  Musculoskeletal: She exhibits no edema (of lower extremities).  Left DF/PF 5/5.Ankle ligaments stable, no plantar tenderness elicited. Pedal pulses intact       Assessment:    1. Diabetes mellitus without complication [facility]): poor control - POCT glycosylated hemoglobin (Hb A1C)  2. Essential hypertension: stable  3. Left foot pain:suspect due to poor arch support    Plan:    Reinforced need to exercise and try to increase Invokana to daily (uses every other day due to urinary urgency). Discussed possibility of adding daily insulin bolus. Add new insoles to shoes and consider podiatry referral.

## 2016-11-23 NOTE — Patient Instructions (Addendum)
Start a walking program up to 30 minutes daily. Increase Invokana to at least one additional day/ week. Try new insoles in your shoes to see if that helps your foot pain. Consider podiatry.

## 2016-12-18 ENCOUNTER — Ambulatory Visit
Admission: RE | Admit: 2016-12-18 | Discharge: 2016-12-18 | Disposition: A | Discharge status: Home / Self Care | Payer: 59 | Source: Ambulatory Visit | Attending: Family Medicine | Admitting: Family Medicine

## 2016-12-18 ENCOUNTER — Other Ambulatory Visit: Payer: Self-pay | Admitting: Family Medicine

## 2016-12-18 ENCOUNTER — Encounter: Payer: Self-pay | Admitting: Family Medicine

## 2016-12-18 ENCOUNTER — Ambulatory Visit (INDEPENDENT_AMBULATORY_CARE_PROVIDER_SITE_OTHER): Payer: 59 | Admitting: Family Medicine

## 2016-12-18 VITALS — BP 138/84 | HR 89 | Temp 98.3°F | Resp 16 | Wt 245.0 lb

## 2016-12-18 DIAGNOSIS — S92515A Nondisplaced fracture of proximal phalanx of left lesser toe(s), initial encounter for closed fracture: Secondary | ICD-10-CM | POA: Insufficient documentation

## 2016-12-18 DIAGNOSIS — X58XXXA Exposure to other specified factors, initial encounter: Secondary | ICD-10-CM | POA: Insufficient documentation

## 2016-12-18 DIAGNOSIS — S99922A Unspecified injury of left foot, initial encounter: Secondary | ICD-10-CM

## 2016-12-18 DIAGNOSIS — M79672 Pain in left foot: Secondary | ICD-10-CM | POA: Diagnosis not present

## 2016-12-18 DIAGNOSIS — S92506A Nondisplaced unspecified fracture of unspecified lesser toe(s), initial encounter for closed fracture: Secondary | ICD-10-CM

## 2016-12-18 DIAGNOSIS — S82245A Nondisplaced spiral fracture of shaft of left tibia, initial encounter for closed fracture: Secondary | ICD-10-CM | POA: Diagnosis not present

## 2016-12-18 NOTE — Patient Instructions (Signed)
Discussed icing for 20 minutes several x day and elevation as you have been doing. We will call you with the x-ray result. Use Tylenol for pain.

## 2016-12-18 NOTE — Progress Notes (Signed)
Subjective:     Patient ID: Chelsea Gomez, female   DOB: 22-Aug-1955, 61 y.o.   MRN: [id number]  HPI  Chief Complaint  Patient presents with  . Foot Injury    Patient comes in office today with complaints of foot pain after stubbing her toe on the corner of her door two nights ago. Patient reports swelling  of the lateral side of foot and swelling of toe, she has had difficulty walking but denies pain when bearing weight on foot and reports pain to the touch.   States she has also noticed swelling over the dorsum of her foot as well. Has not used any ice.   Review of Systems     Objective:   Physical Exam  Constitutional: She appears well-developed and well-nourished. No distress.  Musculoskeletal:  Left fifth toe and dorsum of foot mildly swollen. Exquisitely tender over the fifth metatarsal and toe. No deformity noted. Ankle ligaments stable.       Assessment:    1. Foot injury, left, initial encounter - DG Foot Complete Left; Future    Plan:    Discussed icing and elevation. Tylenol for pain. Further f/u pending x-ray.

## 2016-12-27 DIAGNOSIS — M79672 Pain in left foot: Secondary | ICD-10-CM | POA: Diagnosis not present

## 2016-12-27 DIAGNOSIS — S92515A Nondisplaced fracture of proximal phalanx of left lesser toe(s), initial encounter for closed fracture: Secondary | ICD-10-CM | POA: Diagnosis not present

## 2017-02-01 DIAGNOSIS — M79672 Pain in left foot: Secondary | ICD-10-CM | POA: Diagnosis not present

## 2017-02-01 DIAGNOSIS — S92515D Nondisplaced fracture of proximal phalanx of left lesser toe(s), subsequent encounter for fracture with routine healing: Secondary | ICD-10-CM | POA: Diagnosis not present

## 2017-02-08 ENCOUNTER — Other Ambulatory Visit: Payer: Self-pay | Admitting: Family Medicine

## 2017-02-08 DIAGNOSIS — E119 Type 2 diabetes mellitus without complications: Secondary | ICD-10-CM

## 2017-02-08 DIAGNOSIS — I10 Essential (primary) hypertension: Secondary | ICD-10-CM

## 2017-02-22 ENCOUNTER — Ambulatory Visit: Payer: 59 | Admitting: Family Medicine

## 2017-02-22 ENCOUNTER — Ambulatory Visit: Payer: Self-pay | Admitting: Family Medicine

## 2017-02-22 ENCOUNTER — Encounter: Payer: Self-pay | Admitting: Family Medicine

## 2017-02-22 VITALS — BP 142/82 | HR 75 | Temp 98.6°F | Resp 16 | Wt 244.8 lb

## 2017-02-22 DIAGNOSIS — Z1211 Encounter for screening for malignant neoplasm of colon: Secondary | ICD-10-CM

## 2017-02-22 DIAGNOSIS — I10 Essential (primary) hypertension: Secondary | ICD-10-CM

## 2017-02-22 DIAGNOSIS — E119 Type 2 diabetes mellitus without complications: Secondary | ICD-10-CM

## 2017-02-22 LAB — POCT GLYCOSYLATED HEMOGLOBIN (HGB A1C): HEMOGLOBIN A1C: 8.9

## 2017-02-22 NOTE — Patient Instructions (Addendum)
Stop HCTZ and try Invokana daily. We will call you about the colonoscopy referrral. Come in for a nurse bp check in 1-2 weeks.

## 2017-02-22 NOTE — Progress Notes (Addendum)
Subjective:     Patient ID: Chelsea Gomez, female   DOB: Jul 17, 1955, 61 y.o.   MRN: [id number] Chief Complaint  Patient presents with  . Diabetes    Patient returns to office today for her 3 month follow up appt, last visit was 11/23/16 and HgbA1C was 8.7%. Patient states that she has been working on improving her diet and has cutt back on sweet and starchy foods. Patient reports good compliance and tolerance on medication, she denies any hyperglycemia incidents.  . Hypertension    Patient returns for follow up from 11/23/16 condition at last visit was stable with a blood pressure of 122/86   HPI States her fifth toe fracture has improved after podiatry intervention. Has been unable to take Invokana daily due to excessive diuresis.  Review of Systems  Constitutional:       Defers immunizations  Genitourinary:       Gyn health maintenance through [facility] clinic. Mammogram pending.       Objective:   Physical Exam  Constitutional: She appears well-developed and well-nourished. No distress.  Cardiovascular: Normal rate.  Irregular  Pulmonary/Chest: Breath sounds normal.  Musculoskeletal: She exhibits no edema (of lower extremities).       Assessment:    1. Diabetes mellitus without complication ([place]): discontinue HCTZ and see if she can tolerate Invokana daily. - POCT glycosylated hemoglobin (Hb A1C)  2. Essential hypertension: monitor carefully off HCTZ  3. Screen for colon cancer - Ambulatory referral to Gastroenterology    Plan:    Nurse bp check in 1-2 weeks.Marland Kitchen

## 2017-03-07 ENCOUNTER — Other Ambulatory Visit: Payer: Self-pay | Admitting: Family Medicine

## 2017-03-07 DIAGNOSIS — E119 Type 2 diabetes mellitus without complications: Secondary | ICD-10-CM

## 2017-03-20 ENCOUNTER — Telehealth: Payer: Self-pay

## 2017-03-20 ENCOUNTER — Other Ambulatory Visit: Payer: Self-pay

## 2017-03-20 DIAGNOSIS — Z1211 Encounter for screening for malignant neoplasm of colon: Secondary | ICD-10-CM

## 2017-03-20 NOTE — Telephone Encounter (Signed)
Gastroenterology Pre-Procedure Review  Request Date:  Requesting Physician: Dr.   PATIENT REVIEW QUESTIONS: The patient responded to the following health history questions as indicated:    1. Are you having any GI issues? no 2. Do you have a personal history of Polyps? no 3. Do you have a family history of Colon Cancer or Polyps? no 4. Diabetes Mellitus? no 5. Joint replacements in the past 12 months?no 6. Major health problems in the past 3 months?no 7. Any artificial heart valves, MVP, or defibrillator?no    MEDICATIONS & ALLERGIES:    Patient reports the following regarding taking any anticoagulation/antiplatelet therapy:   Plavix, Coumadin, Eliquis, Xarelto, Lovenox, Pradaxa, Brilinta, or Effient? no Aspirin? yes (ASA '81mg'$ )  Patient confirms/reports the following medications:  Current Outpatient Medications  Medication Sig Dispense Refill  . amLODipine (NORVASC) 5 MG tablet TAKE ONE TABLET BY MOUTH ONCE DAILY 30 tablet 11  . aspirin 81 MG tablet Take 81 mg by mouth daily.    . blood glucose meter kit and supplies KIT Dispense based on patient and insurance preference. Fasting and 2 hours after meals weekly and as needed. 1 each 0  . CALCIUM-VITAMIN D PO Take 800 mg by mouth daily.    . carvedilol (COREG) 6.25 MG tablet TAKE ONE TABLET BY MOUTH TWICE DAILY 60 tablet 11  . cholecalciferol (VITAMIN D) 1000 UNITS tablet Take 1,000 Units by mouth daily.    . folic acid (FOLVITE) 1 MG tablet Take 1 mg by mouth 2 (two) times daily. Pt takes 2 tablet twice daily.    Marland Kitchen glipiZIDE (GLUCOTROL) 10 MG tablet TAKE 1 TABLET BY MOUTH TWICE DAILY 30 MINUTES BEFORE A MEAL 180 tablet 1  . glucose blood test strip Twice weekly fasting and 2 hours after a meal. 100 each 3  . INVOKANA 100 MG TABS tablet TAKE ONE TABLET BY MOUTH ONCE DAILY BEFORE BREAKFAST.  STOP GLIPIZIDE 90 tablet 1  . JANUVIA 100 MG tablet TAKE ONE TABLET BY MOUTH ONCE DAILY 30 tablet 5  . metFORMIN (GLUCOPHAGE) 1000 MG tablet TAKE  ONE TABLET BY MOUTH TWICE DAILY 180 tablet 1  . omeprazole (PRILOSEC) 20 MG capsule Take 20 mg by mouth as needed.    . pravastatin (PRAVACHOL) 80 MG tablet TAKE ONE TABLET BY MOUTH ONCE DAILY 90 tablet 1  . quinapril (ACCUPRIL) 20 MG tablet TAKE ONE TABLET BY MOUTH ONCE DAILY 30 tablet 11  . vitamin C (ASCORBIC ACID) 500 MG tablet Take 500 mg by mouth daily.     No current facility-administered medications for this visit.     Patient confirms/reports the following allergies:  Allergies  Allergen Reactions  . Mevacor [Lovastatin] Other (See Comments)    Muscle cramps    No orders of the defined types were placed in this encounter.   AUTHORIZATION INFORMATION Primary Insurance: 1D#: Group #:  Secondary Insurance: 1D#: Group #:  SCHEDULE INFORMATION: Date: 04/20/17 Time: Location: [place]

## 2017-03-22 ENCOUNTER — Other Ambulatory Visit: Payer: Self-pay

## 2017-04-12 DIAGNOSIS — Z124 Encounter for screening for malignant neoplasm of cervix: Secondary | ICD-10-CM | POA: Diagnosis not present

## 2017-04-12 DIAGNOSIS — Z01419 Encounter for gynecological examination (general) (routine) without abnormal findings: Secondary | ICD-10-CM | POA: Diagnosis not present

## 2017-04-19 ENCOUNTER — Encounter: Payer: Self-pay | Admitting: *Deleted

## 2017-04-20 ENCOUNTER — Ambulatory Visit: Payer: 59 | Admitting: Registered Nurse

## 2017-04-20 ENCOUNTER — Encounter: Payer: Self-pay | Admitting: Anesthesiology

## 2017-04-20 ENCOUNTER — Encounter
Admission: RE | Disposition: A | Discharge status: Home / Self Care | Payer: Self-pay | Source: Ambulatory Visit | Attending: Gastroenterology

## 2017-04-20 ENCOUNTER — Ambulatory Visit
Admission: RE | Admit: 2017-04-20 | Discharge: 2017-04-20 | Disposition: A | Discharge status: Home / Self Care | Payer: 59 | Source: Ambulatory Visit | Attending: Gastroenterology | Admitting: Gastroenterology

## 2017-04-20 DIAGNOSIS — Z888 Allergy status to other drugs, medicaments and biological substances status: Secondary | ICD-10-CM | POA: Diagnosis not present

## 2017-04-20 DIAGNOSIS — Z8249 Family history of ischemic heart disease and other diseases of the circulatory system: Secondary | ICD-10-CM | POA: Diagnosis not present

## 2017-04-20 DIAGNOSIS — Z7984 Long term (current) use of oral hypoglycemic drugs: Secondary | ICD-10-CM | POA: Diagnosis not present

## 2017-04-20 DIAGNOSIS — K5790 Diverticulosis of intestine, part unspecified, without perforation or abscess without bleeding: Secondary | ICD-10-CM | POA: Diagnosis not present

## 2017-04-20 DIAGNOSIS — Z79899 Other long term (current) drug therapy: Secondary | ICD-10-CM | POA: Insufficient documentation

## 2017-04-20 DIAGNOSIS — Z87891 Personal history of nicotine dependence: Secondary | ICD-10-CM | POA: Diagnosis not present

## 2017-04-20 DIAGNOSIS — E785 Hyperlipidemia, unspecified: Secondary | ICD-10-CM | POA: Diagnosis not present

## 2017-04-20 DIAGNOSIS — I10 Essential (primary) hypertension: Secondary | ICD-10-CM | POA: Insufficient documentation

## 2017-04-20 DIAGNOSIS — D122 Benign neoplasm of ascending colon: Secondary | ICD-10-CM

## 2017-04-20 DIAGNOSIS — K635 Polyp of colon: Secondary | ICD-10-CM | POA: Diagnosis not present

## 2017-04-20 DIAGNOSIS — K219 Gastro-esophageal reflux disease without esophagitis: Secondary | ICD-10-CM | POA: Insufficient documentation

## 2017-04-20 DIAGNOSIS — D124 Benign neoplasm of descending colon: Secondary | ICD-10-CM | POA: Diagnosis not present

## 2017-04-20 DIAGNOSIS — E669 Obesity, unspecified: Secondary | ICD-10-CM | POA: Insufficient documentation

## 2017-04-20 DIAGNOSIS — Z7982 Long term (current) use of aspirin: Secondary | ICD-10-CM | POA: Diagnosis not present

## 2017-04-20 DIAGNOSIS — Z6841 Body Mass Index (BMI) 40.0 and over, adult: Secondary | ICD-10-CM | POA: Diagnosis not present

## 2017-04-20 DIAGNOSIS — Z1211 Encounter for screening for malignant neoplasm of colon: Secondary | ICD-10-CM | POA: Diagnosis not present

## 2017-04-20 DIAGNOSIS — K640 First degree hemorrhoids: Secondary | ICD-10-CM | POA: Insufficient documentation

## 2017-04-20 DIAGNOSIS — K5730 Diverticulosis of large intestine without perforation or abscess without bleeding: Secondary | ICD-10-CM | POA: Insufficient documentation

## 2017-04-20 DIAGNOSIS — E119 Type 2 diabetes mellitus without complications: Secondary | ICD-10-CM | POA: Diagnosis not present

## 2017-04-20 HISTORY — PX: COLONOSCOPY WITH PROPOFOL: SHX5780

## 2017-04-20 LAB — GLUCOSE, CAPILLARY: Glucose-Capillary: 111 mg/dL — ABNORMAL HIGH (ref 65–99)

## 2017-04-20 SURGERY — COLONOSCOPY WITH PROPOFOL
Anesthesia: General

## 2017-04-20 MED ORDER — PROPOFOL 500 MG/50ML IV EMUL
INTRAVENOUS | Status: AC
  Filled 2017-04-20: qty 100

## 2017-04-20 MED ORDER — PROPOFOL 500 MG/50ML IV EMUL
INTRAVENOUS | Status: DC | PRN
  Administered 2017-04-20: 140 ug/kg/min via INTRAVENOUS

## 2017-04-20 MED ORDER — LIDOCAINE HCL (PF) 2 % IJ SOLN
INTRAMUSCULAR | Status: AC
  Filled 2017-04-20: qty 20

## 2017-04-20 MED ORDER — SODIUM CHLORIDE 0.9 % IV SOLN
INTRAVENOUS | Status: DC
  Administered 2017-04-20: 1000 mL via INTRAVENOUS

## 2017-04-20 MED ORDER — PROPOFOL 10 MG/ML IV BOLUS
INTRAVENOUS | Status: DC | PRN
  Administered 2017-04-20: 70 mg via INTRAVENOUS

## 2017-04-20 MED ORDER — GLYCOPYRROLATE 0.2 MG/ML IJ SOLN
INTRAMUSCULAR | Status: AC
  Filled 2017-04-20: qty 1

## 2017-04-20 MED ORDER — LIDOCAINE HCL (CARDIAC) 20 MG/ML IV SOLN
INTRAVENOUS | Status: DC | PRN
  Administered 2017-04-20: 40 mg via INTRAVENOUS

## 2017-04-20 NOTE — Op Note (Signed)
[facility] Gastroenterology Patient Name: Chelsea Gomez Procedure Date: 04/20/2017 8:10 AM MRN: [id number] Account #: [id number] Date of Birth: 1955-06-14 Admit Type: Outpatient Age: 62 Room: [facility] ENDO ROOM 4 Gender: Female Note Status: Finalized Procedure:            Colonoscopy Indications:          Screening for colorectal malignant neoplasm Providers:            Jonathon Bellows MD, MD Referring MD:         No Local Md, MD (Referring MD) Medicines:            Monitored Anesthesia Care Complications:        No immediate complications. Procedure:            Pre-Anesthesia Assessment:                       - Prior to the procedure, a History and Physical was                        performed, and patient medications, allergies and                        sensitivities were reviewed. The patient's tolerance of                        previous anesthesia was reviewed.                       - The risks and benefits of the procedure and the                        sedation options and risks were discussed with the                        patient. All questions were answered and informed                        consent was obtained.                       - ASA Grade Assessment: III - A patient with severe                        systemic disease.                       After obtaining informed consent, the colonoscope was                        passed under direct vision. Throughout the procedure,                        the patient's blood pressure, pulse, and oxygen                        saturations were monitored continuously. The                        Colonoscope was introduced through the anus and  advanced to the the cecum, identified by the                        appendiceal orifice, IC valve and transillumination.                        The colonoscopy was performed with ease. The patient                        tolerated the procedure well. The  quality of the bowel                        preparation was good. Findings:      Five sessile polyps were found in the descending colon. The polyps were       6 to 8 mm in size. These polyps were removed with a cold snare.       Resection and retrieval were complete.      A 3 mm polyp was found in the ascending colon. The polyp was sessile.       The polyp was removed with a cold biopsy forceps. Resection and       retrieval were complete.      Multiple small-mouthed diverticula were found in the sigmoid colon.      Non-bleeding internal hemorrhoids were found during retroflexion. The       hemorrhoids were medium-sized and Grade I (internal hemorrhoids that do       not prolapse).      The exam was otherwise without abnormality on direct and retroflexion       views. Impression:           - Five 6 to 8 mm polyps in the descending colon,                        removed with a cold snare. Resected and retrieved.                       - One 3 mm polyp in the ascending colon, removed with a                        cold biopsy forceps. Resected and retrieved.                       - Diverticulosis in the sigmoid colon.                       - Non-bleeding internal hemorrhoids.                       - The examination was otherwise normal on direct and                        retroflexion views. Recommendation:       - Discharge patient to home (with escort).                       - Resume previous diet.                       - Continue present medications.                       -  Await pathology results.                       - Repeat colonoscopy in 3 years for surveillance based                        on pathology results. Procedure Code(s):    --- Professional ---                       [phone], Colonoscopy, flexible; with removal of tumor(s),                        polyp(s), or other lesion(s) by snare technique                       45380, 77, Colonoscopy, flexible; with biopsy, single                         or multiple Diagnosis Code(s):    --- Professional ---                       Z12.11, Encounter for screening for malignant neoplasm                        of colon                       D12.4, Benign neoplasm of descending colon                       D12.2, Benign neoplasm of ascending colon                       K64.0, First degree hemorrhoids                       K57.30, Diverticulosis of large intestine without                        perforation or abscess without bleeding CPT copyright 2016 American Medical Association. All rights reserved. The codes documented in this report are preliminary and upon coder review may  be revised to meet current compliance requirements. Jonathon Bellows, MD Jonathon Bellows MD, MD 04/20/2017 8:41:10 AM This report has been signed electronically. Number of Addenda: 0 Note Initiated On: 04/20/2017 8:10 AM Scope Withdrawal Time: 0 hours 18 minutes 5 seconds  Total Procedure Duration: 0 hours 22 minutes 46 seconds       [facility]

## 2017-04-20 NOTE — Anesthesia Postprocedure Evaluation (Signed)
Anesthesia Post Note  Patient: Chelsea Gomez  Procedure(Gomez) Performed: COLONOSCOPY WITH PROPOFOL (N/A )  Patient location during evaluation: Endoscopy Anesthesia Type: General Level of consciousness: awake and alert Pain management: pain level controlled Vital Signs Assessment: post-procedure vital signs reviewed and stable Respiratory status: spontaneous breathing, nonlabored ventilation, respiratory function stable and patient connected to nasal cannula oxygen Cardiovascular status: blood pressure returned to baseline and stable Postop Assessment: no apparent nausea or vomiting Anesthetic complications: no     Last Vitals:  Vitals:   04/20/17 0845 04/20/17 0847  BP: (!) 88/64 105/64  Pulse: 85   Resp: 17   Temp: (!) 35.8 C   SpO2: 97%     Last Pain:  Vitals:   04/20/17 0842  TempSrc: Tympanic                 Chelsea Gomez

## 2017-04-20 NOTE — Anesthesia Post-op Follow-up Note (Signed)
Anesthesia QCDR form completed.        
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
## 2017-04-20 NOTE — Transfer of Care (Signed)
Immediate Anesthesia Transfer of Care Note  Patient: DEAVEN BARRON  Procedure(s) Performed: COLONOSCOPY WITH PROPOFOL (N/A )  Patient Location: PACU  Anesthesia Type:General  Level of Consciousness: sedated  Airway & Oxygen Therapy: Patient Spontanous Breathing and Patient connected to nasal cannula oxygen  Post-op Assessment: Report given to RN and Post -op Vital signs reviewed and stable  Post vital signs: Reviewed and stable  Last Vitals:  Vitals:   04/20/17 0842 04/20/17 0845  BP:  (!) 88/64  Pulse:  85  Resp:  17  Temp: (!) 35.8 C (!) 35.8 C  SpO2:  97%    Last Pain:  Vitals:   04/20/17 0842  TempSrc: Tympanic         Complications: No apparent anesthesia complications

## 2017-04-21 NOTE — Progress Notes (Signed)
Non-identifying voicemail.  No message left.

## 2017-04-23 ENCOUNTER — Encounter: Payer: Self-pay | Admitting: Gastroenterology

## 2017-04-24 LAB — SURGICAL PATHOLOGY

## 2017-04-26 DIAGNOSIS — Z1231 Encounter for screening mammogram for malignant neoplasm of breast: Secondary | ICD-10-CM | POA: Diagnosis not present

## 2017-04-27 ENCOUNTER — Encounter: Payer: Self-pay | Admitting: Gastroenterology

## 2017-05-24 ENCOUNTER — Ambulatory Visit: Payer: 59 | Admitting: Family Medicine

## 2017-05-24 ENCOUNTER — Encounter: Payer: Self-pay | Admitting: Family Medicine

## 2017-05-24 VITALS — BP 120/70 | HR 74 | Temp 98.6°F | Resp 16 | Wt 238.0 lb

## 2017-05-24 DIAGNOSIS — I10 Essential (primary) hypertension: Secondary | ICD-10-CM | POA: Diagnosis not present

## 2017-05-24 DIAGNOSIS — E119 Type 2 diabetes mellitus without complications: Secondary | ICD-10-CM

## 2017-05-24 LAB — POCT GLYCOSYLATED HEMOGLOBIN (HGB A1C): HEMOGLOBIN A1C: 7.2

## 2017-05-24 NOTE — Patient Instructions (Signed)
Encourage walking program daily working up to 30 minutes. We will update your labs at the next office visit. Don't forget an eye exam this year.

## 2017-05-24 NOTE — Progress Notes (Signed)
Subjective:     Patient ID: Chelsea Gomez, female   DOB: [birth year], 62 y.o.   MRN: [id number] Chief Complaint  Patient presents with  . Hypertension    follow up  . Diabetes    follow up   HPI States she has been taking Invokana daily and has minimized "white" carbohydrates in her diet.. Does not exercise daily. Recent colonoscopy with polyps with 3 year f/u.  Review of Systems  Respiratory: Negative for shortness of breath.   Cardiovascular: Negative for chest pain and palpitations.  Neurological:       Occasional discomfort in her thigh areas bilaterally ? Meralgia.       Objective:   Physical Exam  Constitutional: She appears well-developed and well-nourished. No distress.  Cardiovascular: Normal rate and regular rhythm.  Pulses:      Dorsalis pedis pulses are 2+ on the right side, and 2+ on the left side.       Posterior tibial pulses are 2+ on the right side, and 2+ on the left side.  Pulmonary/Chest: Breath sounds normal.  Musculoskeletal: She exhibits no edema (of lower extremities).       Assessment:    1. Diabetes mellitus without complication ([place]): improved - POCT glycosylated hemoglobin (Hb A1C)  2. Essential hypertension: stable    Plan:    Encouraged walking program and eye exam this year. Will update labs at next visit.

## 2017-07-25 ENCOUNTER — Other Ambulatory Visit: Payer: Self-pay | Admitting: Family Medicine

## 2017-07-25 NOTE — Telephone Encounter (Signed)
Metformin 1000 MCG  Last OV 05/24/2017  Last refilled 10/19/2016 #150 Next OV 08/24/2017

## 2017-08-02 ENCOUNTER — Encounter: Payer: Self-pay | Admitting: Family Medicine

## 2017-08-02 DIAGNOSIS — E119 Type 2 diabetes mellitus without complications: Secondary | ICD-10-CM | POA: Diagnosis not present

## 2017-08-02 DIAGNOSIS — H2513 Age-related nuclear cataract, bilateral: Secondary | ICD-10-CM | POA: Diagnosis not present

## 2017-08-02 DIAGNOSIS — H43811 Vitreous degeneration, right eye: Secondary | ICD-10-CM | POA: Diagnosis not present

## 2017-08-02 LAB — HM DIABETES EYE EXAM

## 2017-08-24 ENCOUNTER — Ambulatory Visit: Payer: 59 | Admitting: Family Medicine

## 2017-08-24 ENCOUNTER — Encounter: Payer: Self-pay | Admitting: Family Medicine

## 2017-08-24 VITALS — BP 128/64 | HR 99 | Temp 98.6°F | Resp 16 | Wt 236.4 lb

## 2017-08-24 DIAGNOSIS — E119 Type 2 diabetes mellitus without complications: Secondary | ICD-10-CM | POA: Diagnosis not present

## 2017-08-24 DIAGNOSIS — I10 Essential (primary) hypertension: Secondary | ICD-10-CM | POA: Diagnosis not present

## 2017-08-24 DIAGNOSIS — E782 Mixed hyperlipidemia: Secondary | ICD-10-CM | POA: Diagnosis not present

## 2017-08-24 DIAGNOSIS — H6981 Other specified disorders of Eustachian tube, right ear: Secondary | ICD-10-CM | POA: Diagnosis not present

## 2017-08-24 LAB — POCT GLYCOSYLATED HEMOGLOBIN (HGB A1C): Hemoglobin A1C: 8.3 % — AB (ref 4.0–5.6)

## 2017-08-24 NOTE — Patient Instructions (Signed)
Do take glipizide twice daily 30 minutes before a meal. Try Flonase spray or similar for your ear discomfort. Check out medication prices with the Good Rx card. We will call you with the lab results.

## 2017-08-24 NOTE — Progress Notes (Signed)
  Subjective:     Patient ID: Chelsea Gomez, female   DOB: Aug 03, 1955, 62 y.o.   MRN: [id number] Chief Complaint  Patient presents with  . Diabetes    Patient comes in office today for three month follow up, patient was last seen in office 05/24/17 HgbA1C at visit was 7.2%. Patient denies symptoms of increased thirst, urination, or visual changes. Patient reports good compliance and tolerance on medication.  Marland Kitchen Hearing Problem    Patient would like to address decreased hearing in her right ear for one month.     . Hypertension    3 month follow up from 05/24/17 blood pressure at last visit was 120/70, patient reports good compliance and tolerance on Amlodpine   HPI States she has not been taking glipizide twice daily and can only tolerate Invokana every other day. States ear discomfort is a crackling sound. No allergy or cold sx at this time.Has had recent eye exam.  Review of Systems  Respiratory: Negative for shortness of breath.   Cardiovascular: Negative for chest pain and palpitations.       Objective:   Physical Exam  Constitutional: She appears well-developed and well-nourished. No distress.  Lungs: clear Heart: RRR without murmur Lower extremities: no edema; pedal pulses intact, sensation to monofilament intact, no wounds noted.     Assessment:    1. Diabetes mellitus without complication ([place]) - POCT glycosylated hemoglobin (Hb A1C)  2. Essential hypertension - Comprehensive metabolic panel  3. Mixed hyperlipidemia - Lipid panel  4. Eustachian tube dysfunction, right:     Plan:    Discussed increasing glipizide to twice daily and taking 30 minutes before a meal. Try Flonase spray for ear discomfort. Provided with a Good Rx card. Further f/u pending lab results.

## 2017-08-25 LAB — LIPID PANEL
CHOL/HDL RATIO: 5.1 ratio — AB (ref 0.0–4.4)
Cholesterol, Total: 197 mg/dL (ref 100–199)
HDL: 39 mg/dL — ABNORMAL LOW (ref >39)
LDL Calculated: 120 mg/dL — ABNORMAL HIGH (ref 0–99)
Triglycerides: 191 mg/dL — ABNORMAL HIGH (ref 0–149)
VLDL CHOLESTEROL CAL: 38 mg/dL (ref 5–40)

## 2017-08-25 LAB — COMPREHENSIVE METABOLIC PANEL
ALK PHOS: 93 IU/L (ref 39–117)
ALT: 12 IU/L (ref 0–32)
AST: 12 IU/L (ref 0–40)
Albumin/Globulin Ratio: 1.3 (ref 1.2–2.2)
Albumin: 4.3 g/dL (ref 3.6–4.8)
BUN/Creatinine Ratio: 16 (ref 12–28)
BUN: 9 mg/dL (ref 8–27)
Bilirubin Total: 0.4 mg/dL (ref 0.0–1.2)
CALCIUM: 9.4 mg/dL (ref 8.7–10.3)
CO2: 21 mmol/L (ref 20–29)
CREATININE: 0.55 mg/dL — AB (ref 0.57–1.00)
Chloride: 100 mmol/L (ref 96–106)
GFR calc Af Amer: 115 mL/min/{1.73_m2} (ref >59)
GFR, EST NON AFRICAN AMERICAN: 100 mL/min/{1.73_m2} (ref >59)
Globulin, Total: 3.3 g/dL (ref 1.5–4.5)
Glucose: 258 mg/dL — ABNORMAL HIGH (ref 65–99)
POTASSIUM: 4 mmol/L (ref 3.5–5.2)
Sodium: 141 mmol/L (ref 134–144)
Total Protein: 7.6 g/dL (ref 6.0–8.5)

## 2017-08-27 ENCOUNTER — Telehealth: Payer: Self-pay

## 2017-08-27 NOTE — Telephone Encounter (Signed)
[id number] 

## 2017-08-27 NOTE — Telephone Encounter (Signed)
-----   Message from [place]on Ginsberg, [place] sent at 08/25/2017  8:46 AM EDT ----- Labs ok except for elevated sugar. Mild cholesterol elevation may reflect the crackers you ate that day. Kidney function is good.

## 2017-08-29 DIAGNOSIS — H43811 Vitreous degeneration, right eye: Secondary | ICD-10-CM | POA: Diagnosis not present

## 2017-08-29 DIAGNOSIS — H40053 Ocular hypertension, bilateral: Secondary | ICD-10-CM | POA: Diagnosis not present

## 2017-08-29 DIAGNOSIS — H3561 Retinal hemorrhage, right eye: Secondary | ICD-10-CM | POA: Diagnosis not present

## 2017-08-30 NOTE — Telephone Encounter (Signed)
Patient was notified of results. Expressed understanding.  

## 2017-10-10 DIAGNOSIS — H43811 Vitreous degeneration, right eye: Secondary | ICD-10-CM | POA: Diagnosis not present

## 2017-10-10 DIAGNOSIS — H25013 Cortical age-related cataract, bilateral: Secondary | ICD-10-CM | POA: Diagnosis not present

## 2017-10-10 DIAGNOSIS — H2513 Age-related nuclear cataract, bilateral: Secondary | ICD-10-CM | POA: Diagnosis not present

## 2017-10-10 DIAGNOSIS — H40053 Ocular hypertension, bilateral: Secondary | ICD-10-CM | POA: Diagnosis not present

## 2017-11-21 ENCOUNTER — Other Ambulatory Visit: Payer: Self-pay | Admitting: Family Medicine

## 2017-11-21 DIAGNOSIS — I10 Essential (primary) hypertension: Secondary | ICD-10-CM

## 2017-11-28 ENCOUNTER — Ambulatory Visit: Payer: Self-pay | Admitting: Family Medicine

## 2017-12-05 ENCOUNTER — Encounter: Payer: Self-pay | Admitting: Family Medicine

## 2017-12-05 ENCOUNTER — Ambulatory Visit: Payer: 59 | Admitting: Family Medicine

## 2017-12-05 VITALS — BP 122/70 | HR 86 | Temp 98.5°F | Resp 16 | Wt 240.0 lb

## 2017-12-05 DIAGNOSIS — R202 Paresthesia of skin: Secondary | ICD-10-CM

## 2017-12-05 DIAGNOSIS — E119 Type 2 diabetes mellitus without complications: Secondary | ICD-10-CM | POA: Diagnosis not present

## 2017-12-05 DIAGNOSIS — F439 Reaction to severe stress, unspecified: Secondary | ICD-10-CM

## 2017-12-05 LAB — POCT GLYCOSYLATED HEMOGLOBIN (HGB A1C): Hemoglobin A1C: 8.6 % — AB (ref 4.0–5.6)

## 2017-12-05 NOTE — Patient Instructions (Addendum)
Try Invokana 300 mg every other day. Try Zostrix for skin pain on your back. We will call you about the counseling referral. Let me know how you are doing on the increased Invokana in two weeks.

## 2017-12-05 NOTE — Progress Notes (Signed)
  Subjective:     Patient ID: Chelsea Gomez, female   DOB: 01/22/1956, 62 y.o.   MRN: [id number] Chief Complaint  Patient presents with  . Diabetes    Patient returns to office today for 3 month follow up, last HgbA1c was 08/24/17 at 8.3%. Patient denies symptoms of polydipsia, polyuria or foot ulcers. Patient reports that her last diabetic eye exam was about a month ago. Patient denies any hyperglycemia incidents since last visit and reports good compliance and tolerance on medication.   . Hypertension    Patient returns for 3 month follow up from 08/24/17 blood pressure at last visit was 128/64. Patient reports good compliance and tolerance on medication.   . Hyperlipidemia    3 month follow up from 08/24/17  . Rash    Patient reports that she has burning and itching on the right side of her back and states that she has concerns of shingles outbreak. Patient denies rash or blisters forming on skin, she states that she has applied rubbing alcohol to skin and cortizone cream.   HPI States she felt the discomfort on her  Right back a month ago but no rash. Did have a blistering rash under her right breast which resolved with hydrocortisone cream. Continues to take Invokana every other day. Reports she is taking the glipizide twice daily now. Defers flu vaccine.  Review of Systems  Respiratory: Negative for shortness of breath.   Cardiovascular: Negative for chest pain and palpitations.  Psychiatric/Behavioral:       Reports increased stress with her adult daughter who has moved back home. Wishes counseling referral.       Objective:   Physical Exam  Constitutional: She appears well-developed and well-nourished. No distress.  Cardiovascular: Normal rate and regular rhythm.  Pulmonary/Chest: Breath sounds normal.  Musculoskeletal: She exhibits no edema.  Skin:  Right mid back above bra line with topical sensitivity but no rash.       Assessment:    1. Diabetes mellitus without  complication ([place]); samples of Invokana 300 mg. X 15 - POCT glycosylated hemoglobin (Hb A1C)  2. Situational stress - Ambulatory referral to Psychology  3. Paresthesias; Try Zostrix.? PHN.    Plan:    Phone follow up in two weeks re: Invokana.

## 2017-12-27 ENCOUNTER — Other Ambulatory Visit: Payer: Self-pay | Admitting: Family Medicine

## 2017-12-27 ENCOUNTER — Telehealth: Payer: Self-pay | Admitting: Family Medicine

## 2017-12-27 MED ORDER — CANAGLIFLOZIN 300 MG PO TABS: 300.0000 mg | ORAL_TABLET | Freq: Every day | ORAL | 1 refills | Status: DC

## 2017-12-27 NOTE — Telephone Encounter (Signed)
Pt wanting to let Mikki Santee know the Invokana 300 mg is working great for her. Pt is asking for a Rx to be called in for her.   Please call into: [facility] [address] ([address]), [place] - [place] [phone] (Phone) [phone] (Fax)    Thanks, [facility]

## 2017-12-27 NOTE — Telephone Encounter (Signed)
done
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
## 2018-03-14 ENCOUNTER — Other Ambulatory Visit: Payer: Self-pay

## 2018-03-14 ENCOUNTER — Encounter: Payer: Self-pay | Admitting: Family Medicine

## 2018-03-14 ENCOUNTER — Ambulatory Visit: Payer: 59 | Admitting: Family Medicine

## 2018-03-14 VITALS — BP 148/86 | HR 88 | Temp 98.0°F | Ht 62.0 in | Wt 233.8 lb

## 2018-03-14 DIAGNOSIS — E119 Type 2 diabetes mellitus without complications: Secondary | ICD-10-CM | POA: Diagnosis not present

## 2018-03-14 DIAGNOSIS — F439 Reaction to severe stress, unspecified: Secondary | ICD-10-CM

## 2018-03-14 DIAGNOSIS — I10 Essential (primary) hypertension: Secondary | ICD-10-CM

## 2018-03-14 LAB — POCT GLYCOSYLATED HEMOGLOBIN (HGB A1C): HEMOGLOBIN A1C: 8.2 % — AB (ref 4.0–5.6)

## 2018-03-14 NOTE — Progress Notes (Signed)
  Subjective:     Patient ID: Chelsea Gomez, female   DOB: [birth year], 63 y.o.   MRN: [id number] Chief Complaint  Patient presents with  . Follow-up    3 month diabetes   HPI She did not take her medication or eat as she though she was going to have labs today. Reports tolerating Invokana on a qod basis. She does not exercise but has lost 7# since last office visit. Reports working 16 hours/week at the [employer]. Did not return calls regarding prior psychology referral which was cancelled. States one sister died in 03/10/23 and her adult daughter continues to live with her and wishes to access counseling. She is pending gyn health maintenance with Dr. Suszanne Finch.  Review of Systems  Respiratory: Negative for shortness of breath.   Cardiovascular: Negative for chest pain and palpitations.       Objective:   Physical Exam Constitutional:      General: She is not in acute distress. Cardiovascular:     Rate and Rhythm: Normal rate and regular rhythm.  Pulmonary:     Effort: Pulmonary effort is normal.  Musculoskeletal:     Right lower leg: No edema.     Left lower leg: No edema.  Neurological:     Mental Status: She is alert.        Assessment:    1. Diabetes mellitus without complication ([place]): improved  - POCT glycosylated hemoglobin (Hb A1C)  2. Essential hypertension: stable  3. Situational stress - Ambulatory referral to Psychology    Plan:    Do f/u with gyn. Encouraged walking.

## 2018-03-14 NOTE — Patient Instructions (Addendum)
Do follow up with gyn this year as scheduled. We will update labs in June. You should get a call about the psychology referral.

## 2018-03-14 NOTE — Progress Notes (Signed)
See note above

## 2018-03-28 ENCOUNTER — Ambulatory Visit: Payer: 59 | Admitting: Psychology

## 2018-03-28 DIAGNOSIS — F4323 Adjustment disorder with mixed anxiety and depressed mood: Secondary | ICD-10-CM | POA: Diagnosis not present

## 2018-04-10 DIAGNOSIS — H43811 Vitreous degeneration, right eye: Secondary | ICD-10-CM | POA: Diagnosis not present

## 2018-04-10 DIAGNOSIS — H40053 Ocular hypertension, bilateral: Secondary | ICD-10-CM | POA: Diagnosis not present

## 2018-04-10 DIAGNOSIS — H2513 Age-related nuclear cataract, bilateral: Secondary | ICD-10-CM | POA: Diagnosis not present

## 2018-04-18 ENCOUNTER — Ambulatory Visit: Payer: 59 | Admitting: Psychology

## 2018-04-18 DIAGNOSIS — F4323 Adjustment disorder with mixed anxiety and depressed mood: Secondary | ICD-10-CM | POA: Diagnosis not present

## 2018-05-02 ENCOUNTER — Other Ambulatory Visit: Payer: Self-pay | Admitting: Family Medicine

## 2018-05-02 DIAGNOSIS — E119 Type 2 diabetes mellitus without complications: Secondary | ICD-10-CM

## 2018-05-02 DIAGNOSIS — I10 Essential (primary) hypertension: Secondary | ICD-10-CM

## 2018-05-02 NOTE — Telephone Encounter (Signed)
Please review. KW 

## 2018-05-09 ENCOUNTER — Ambulatory Visit: Payer: 59 | Admitting: Psychology

## 2018-05-09 DIAGNOSIS — F4323 Adjustment disorder with mixed anxiety and depressed mood: Secondary | ICD-10-CM | POA: Diagnosis not present

## 2018-06-06 ENCOUNTER — Ambulatory Visit: Payer: 59 | Admitting: Psychology

## 2018-06-13 ENCOUNTER — Ambulatory Visit: Payer: Self-pay

## 2018-06-13 ENCOUNTER — Ambulatory Visit: Payer: Self-pay | Admitting: Family Medicine

## 2018-06-13 ENCOUNTER — Ambulatory Visit (INDEPENDENT_AMBULATORY_CARE_PROVIDER_SITE_OTHER): Payer: 59 | Admitting: Physician Assistant

## 2018-06-13 ENCOUNTER — Encounter: Payer: Self-pay | Admitting: Physician Assistant

## 2018-06-13 ENCOUNTER — Other Ambulatory Visit: Payer: Self-pay

## 2018-06-13 VITALS — BP 139/77 | HR 99 | Temp 98.3°F | Resp 16 | Wt 232.6 lb

## 2018-06-13 DIAGNOSIS — E785 Hyperlipidemia, unspecified: Secondary | ICD-10-CM

## 2018-06-13 DIAGNOSIS — Z91148 Patient's other noncompliance with medication regimen for other reason: Secondary | ICD-10-CM

## 2018-06-13 DIAGNOSIS — E119 Type 2 diabetes mellitus without complications: Secondary | ICD-10-CM | POA: Diagnosis not present

## 2018-06-13 DIAGNOSIS — Z9114 Patient's other noncompliance with medication regimen: Secondary | ICD-10-CM

## 2018-06-13 DIAGNOSIS — E1169 Type 2 diabetes mellitus with other specified complication: Secondary | ICD-10-CM | POA: Diagnosis not present

## 2018-06-13 DIAGNOSIS — I10 Essential (primary) hypertension: Secondary | ICD-10-CM | POA: Diagnosis not present

## 2018-06-13 DIAGNOSIS — Z1239 Encounter for other screening for malignant neoplasm of breast: Secondary | ICD-10-CM | POA: Diagnosis not present

## 2018-06-13 LAB — POCT GLYCOSYLATED HEMOGLOBIN (HGB A1C)
Est. average glucose Bld gHb Est-mCnc: 169
Hemoglobin A1C: 7.5 % — AB (ref 4.0–5.6)

## 2018-06-13 MED ORDER — ATORVASTATIN CALCIUM 20 MG PO TABS: 20.0000 mg | ORAL_TABLET | Freq: Every day | ORAL | 1 refills | Status: DC

## 2018-06-13 NOTE — Chronic Care Management (AMB) (Cosign Needed)
  Care Management   Note  06/13/2018 Name: Chelsea Gomez MRN: [id number] DOB: [birth year]  Chelsea Gomez is a 63 year old femail who sees Kathleene Hazel, [place] for primary care. Ms Chelsea Gomez asked the CCM team to consult the patient for medication management, specifically diabetic medication affordibility. Patient has a history of but not limited to HTN, DM, Hyperlipidemia. Referral was placed today 06/13/2018 during telemedicine visit.  Telephone outreach to patient today to introduce CCM services.  SDOH (Social Determinants of Health) screening performed today. See Care Plan Entry related to challenges with: Financial Strain /medication affordability  Patient assessed. No needs identified for Clinic Social Work and patient feels she is managing her chronic conditions well with lifestyle modifications.  Chelsea Gomez was given information about Care Management services today including:  1. Case Management services include personalized support from designated clinical staff supervised by a physician, including individualized plan of care and coordination with other care providers 2. 24/7 contact phone numbers for assistance for urgent and routine care needs. 3. The patient may stop CCM services at any time (effective at the end of the month) by phone call to the office staff.   Patient agreed to services and verbal consent obtained.     Plan: patient will be contacted next week by CCM Pharmacist  Chelsea Rodas E. Rollene Rotunda, RN, BSN Nurse Care Coordinator [facility] Care Management [phone]

## 2018-06-13 NOTE — Progress Notes (Signed)
Patient: Chelsea Gomez Female    DOB: 03/30/1955   63 y.o.   MRN: [id number] Visit Date: 06/13/2018  Today's Provider: Trinna Post, PA-C   Chief Complaint  Patient presents with  . Diabetes   Subjective:     HPI  Diabetes Mellitus Type II, Follow-up:   Lab Results  Component Value Date   HGBA1C 7.5 (A) 06/13/2018   HGBA1C 8.2 (A) 03/14/2018   HGBA1C 8.6 (A) 12/05/2017    Last seen for diabetes 3 months ago.  Management since then includes check a1c. She is taking Tong[place] once every three days because it is too expensive. She is taking invokana once every other day because she says it makes her pee too often. Says she has difficulty affording medications. Refuses to take GLP1. Refuses pneumonia shot.  She is not having side effects.  Current symptoms include none and have been stable. Home blood sugar records: is not being checked  Episodes of hypoglycemia? no   Current Insulin Regimen:  Most Recent Eye Exam: up to date Weight trend: stable Prior visit with dietician: No Current exercise: none Current diet habits: in general, an "unhealthy" diet  HLD: Currently taking pravastatin 80 mg daily and says this costs her 80 dollars per month at [employer].   Pertinent Labs:    Component Value Date/Time   CHOL 197 08/24/2017 1000   TRIG 191 (H) 08/24/2017 1000   HDL 39 (L) 08/24/2017 1000   LDLCALC 120 (H) 08/24/2017 1000   CREATININE 0.55 (L) 08/24/2017 1000   CREATININE 0.61 12/16/2011 0014    Wt Readings from Last 3 Encounters:  06/13/18 232 lb 9.6 oz (105.5 kg)  03/14/18 233 lb 12.8 oz (106.1 kg)  12/05/17 240 lb (108.9 kg)   HTN: She is taking amlodipine 5 mg daily and quinapril 20 mg daily without issues.  BP Readings from Last 3 Encounters:  06/13/18 139/77  03/14/18 (!) 148/86  12/05/17 122/70     ------------------------------------------------------------------------  Allergies  Allergen Reactions  . Mevacor [Lovastatin] Other (See  Comments)    Muscle cramps     Current Outpatient Medications:  .  amLODipine (NORVASC) 5 MG tablet, Take 1 tablet by mouth once daily, Disp: 30 tablet, Rfl: 11 .  aspirin 81 MG tablet, Take 81 mg by mouth daily., Disp: , Rfl:  .  blood glucose meter kit and supplies KIT, Dispense based on patient and insurance preference. Fasting and 2 hours after meals weekly and as needed., Disp: 1 each, Rfl: 0 .  CALCIUM-VITAMIN D PO, Take 800 mg by mouth daily., Disp: , Rfl:  .  canagliflozin (INVOKANA) 300 MG TABS tablet, Take 1 tablet (300 mg total) by mouth daily before breakfast., Disp: 90 tablet, Rfl: 1 .  carvedilol (COREG) 6.25 MG tablet, Take 1 tablet by mouth twice daily, Disp: 60 tablet, Rfl: 11 .  cholecalciferol (VITAMIN D) 1000 UNITS tablet, Take 1,000 Units by mouth daily., Disp: , Rfl:  .  glipiZIDE (GLUCOTROL) 10 MG tablet, TAKE 1 TABLET BY MOUTH TWICE DAILY 30 MINUTES BEFORE A MEAL, Disp: 180 tablet, Rfl: 3 .  glucose blood test strip, Twice weekly fasting and 2 hours after a meal., Disp: 100 each, Rfl: 3 .  JANUVIA 100 MG tablet, TAKE ONE TABLET BY MOUTH ONCE DAILY, Disp: 30 tablet, Rfl: 5 .  metFORMIN (GLUCOPHAGE) 1000 MG tablet, TAKE 1 TABLET BY MOUTH TWICE DAILY, Disp: 180 tablet, Rfl: 1 .  olopatadine (PATANOL) 0.1 % ophthalmic  solution, INSTILL 1 DROP INTO EACH EYE TWICE DAILY, Disp: , Rfl: 3 .  omeprazole (PRILOSEC) 20 MG capsule, Take 20 mg by mouth as needed., Disp: , Rfl:  .  pravastatin (PRAVACHOL) 80 MG tablet, TAKE ONE TABLET BY MOUTH ONCE DAILY, Disp: 90 tablet, Rfl: 1 .  quinapril (ACCUPRIL) 20 MG tablet, TAKE 1 TABLET BY MOUTH ONCE DAILY, Disp: 90 tablet, Rfl: 3 .  vitamin C (ASCORBIC ACID) 500 MG tablet, Take 500 mg by mouth daily., Disp: , Rfl:   Review of Systems  Constitutional: Negative.   Respiratory: Negative.   Cardiovascular: Negative.     Social History   Tobacco Use  . Smoking status: Former Smoker    Types: Cigarettes  . Smokeless tobacco: Never Used   Substance Use Topics  . Alcohol use: No      Objective:   BP 139/77 (BP Location: Left Arm, Patient Position: Sitting, Cuff Size: Large)   Pulse 99   Temp 98.3 F (36.8 C) (Oral)   Resp 16   Wt 232 lb 9.6 oz (105.5 kg)   SpO2 94%   BMI 42.54 kg/m  Vitals:   06/13/18 0919  BP: 139/77  Pulse: 99  Resp: 16  Temp: 98.3 F (36.8 C)  TempSrc: Oral  SpO2: 94%  Weight: 232 lb 9.6 oz (105.5 kg)     Physical Exam Constitutional:      Appearance: Normal appearance. She is obese.  Cardiovascular:     Rate and Rhythm: Normal rate and regular rhythm.     Heart sounds: Normal heart sounds.  Pulmonary:     Breath sounds: Normal breath sounds.  Skin:    General: Skin is warm and dry.  Neurological:     Mental Status: She is alert.  Psychiatric:        Mood and Affect: Mood normal.        Behavior: Behavior normal.     Diabetic Foot Exam - Simple   Simple Foot Form Diabetic Foot exam was performed with the following findings:  Yes 06/13/2018  9:22 AM  Visual Inspection No deformities, no ulcerations, no other skin breakdown bilaterally:  Yes Sensation Testing Intact to touch and monofilament testing bilaterally:  Yes Pulse Check Posterior Tibialis and Dorsalis pulse intact bilaterally:  Yes Comments        Assessment & Plan    1. Diabetes mellitus without complication (HCC)  F6E is 7.5 today and is above goal on four oral medications. She is not taking medications correctly due to cost of januvia and side effect of invokana. Will refer to CCM for help affording medications. Will change pravastatin to Lipitor as her cholesterol is uncontrolled anyway. I am shocked that pravastatin would cost 80 dollars a month at [employer]. She refuses penumonia vaccine and I have counseled on the importance of this. She also refuses a GLP1 because she says she doesn't want to go on a needle because her mother and sister were on that and she "saw what they went through." Have explained her  mother and sister likely needed insulin due to poorly controlled diabetes, which is what we are trying to prevent. She says she "will think about it," though apparently she has been thinking about it for many years already as previous PCP has brought this up as well.  - POCT glycosylated hemoglobin (Hb A1C) - Ambulatory referral to Chronic Care Management Services - Comprehensive Metabolic Panel (CMET) - CBC with Differential  2. Screening for breast cancer  -  MM Digital Diagnostic Bilat; Future  3. Hyperlipidemia associated with type 2 diabetes mellitus (HCC)  - atorvastatin (LIPITOR) 20 MG tablet; Take 1 tablet (20 mg total) by mouth daily.  Dispense: 90 tablet; Refill: 1  4. Essential hypertension  Continue amlodipine and quinapril.  5. Noncompliance with medication treatment due to intermittent use of medication   Patient absolutely refuses GLP1 though I think this would help consolidate treatment regimen. Takes Tonga every 3 days due to cost. Takes invokana every other day due to frequent urination though I have told her if she would take this every day, this side effect would level off.   The entirety of the information documented in the History of Present Illness, Review of Systems and Physical Exam were personally obtained by me. Portions of this information were initially documented by Lynford Humphrey, CMA and reviewed by me for thoroughness and accuracy.   F/u 3 mo for DM, HTN, HLD    Trinna Post, PA-C  [place]

## 2018-06-13 NOTE — Patient Instructions (Signed)
1. Thank You for allowing the CCM (Chronic Care Management) Team to assist you with your healthcare goals!! We look forward to speaking with you next week 2. Please bring ALL medications to your appointment! If you have a blood sugar meter or a blood pressure monitor at home, bring those as well.  3.  Contact the CCM Team if you have any question or need  CCM (Chronic Care Management) Team   Trish Fountain RN, BSN Nurse Care Coordinator  [phone]  Ruben Reason PharmD  Clinical Pharmacist  [phone]   [place], [place] Clinical Social Worker [phone]  Ms. Dizon was given information about Care Management services today including:  1. Case Management services includes personalized support from designated clinical staff supervised by her physician, including individualized plan of care and coordination with other care providers 2. 24/7 contact phone numbers for assistance for urgent and routine care needs. 3. The patient may stop case management services at any time by phone call to the office staff.  Patient agreed to services and verbal consent obtained.

## 2018-06-13 NOTE — Patient Instructions (Signed)
Diabetes Mellitus and Exercise Exercising regularly is important for your overall health, especially when you have diabetes (diabetes mellitus). Exercising is not only about losing weight. It has many other health benefits, such as increasing muscle strength and bone density and reducing body fat and stress. This leads to improved fitness, flexibility, and endurance, all of which result in better overall health. Exercise has additional benefits for people with diabetes, including:  Reducing appetite.  Helping to lower and control blood glucose.  Lowering blood pressure.  Helping to control amounts of fatty substances (lipids) in the blood, such as cholesterol and triglycerides.  Helping the body to respond better to insulin (improving insulin sensitivity).  Reducing how much insulin the body needs.  Decreasing the risk for heart disease by: ? Lowering cholesterol and triglyceride levels. ? Increasing the levels of good cholesterol. ? Lowering blood glucose levels. What is my activity plan? Your health care provider or certified diabetes educator can help you make a plan for the type and frequency of exercise (activity plan) that works for you. Make sure that you:  Do at least 150 minutes of moderate-intensity or vigorous-intensity exercise each week. This could be brisk walking, biking, or water aerobics. ? Do stretching and strength exercises, such as yoga or weightlifting, at least 2 times a week. ? Spread out your activity over at least 3 days of the week.  Get some form of physical activity every day. ? Do not go more than 2 days in a row without some kind of physical activity. ? Avoid being inactive for more than 30 minutes at a time. Take frequent breaks to walk or stretch.  Choose a type of exercise or activity that you enjoy, and set realistic goals.  Start slowly, and gradually increase the intensity of your exercise over time. What do I need to know about managing my  diabetes?   Check your blood glucose before and after exercising. ? If your blood glucose is 240 mg/dL (13.3 mmol/L) or higher before you exercise, check your urine for ketones. If you have ketones in your urine, do not exercise until your blood glucose returns to normal. ? If your blood glucose is 100 mg/dL (5.6 mmol/L) or lower, eat a snack containing 15-20 grams of carbohydrate. Check your blood glucose 15 minutes after the snack to make sure that your level is above 100 mg/dL (5.6 mmol/L) before you start your exercise.  Know the symptoms of low blood glucose (hypoglycemia) and how to treat it. Your risk for hypoglycemia increases during and after exercise. Common symptoms of hypoglycemia can include: ? Hunger. ? Anxiety. ? Sweating and feeling clammy. ? Confusion. ? Dizziness or feeling light-headed. ? Increased heart rate or palpitations. ? Blurry vision. ? Tingling or numbness around the mouth, lips, or tongue. ? Tremors or shakes. ? Irritability.  Keep a rapid-acting carbohydrate snack available before, during, and after exercise to help prevent or treat hypoglycemia.  Avoid injecting insulin into areas of the body that are going to be exercised. For example, avoid injecting insulin into: ? The arms, when playing tennis. ? The legs, when jogging.  Keep records of your exercise habits. Doing this can help you and your health care provider adjust your diabetes management plan as needed. Write down: ? Food that you eat before and after you exercise. ? Blood glucose levels before and after you exercise. ? The type and amount of exercise you have done. ? When your insulin is expected to peak, if you use   insulin. Avoid exercising at times when your insulin is peaking.  When you start a new exercise or activity, work with your health care provider to make sure the activity is safe for you, and to adjust your insulin, medicines, or food intake as needed.  Drink plenty of water while  you exercise to prevent dehydration or heat stroke. Drink enough fluid to keep your urine clear or pale yellow. Summary  Exercising regularly is important for your overall health, especially when you have diabetes (diabetes mellitus).  Exercising has many health benefits, such as increasing muscle strength and bone density and reducing body fat and stress.  Your health care provider or certified diabetes educator can help you make a plan for the type and frequency of exercise (activity plan) that works for you.  When you start a new exercise or activity, work with your health care provider to make sure the activity is safe for you, and to adjust your insulin, medicines, or food intake as needed. This information is not intended to replace advice given to you by your health care provider. Make sure you discuss any questions you have with your health care provider. Document Released: 05/13/2003 Document Revised: 08/31/2016 Document Reviewed: 08/02/2015 Elsevier Interactive Patient Education  2019 Elsevier Inc.  

## 2018-06-17 ENCOUNTER — Telehealth: Payer: Self-pay

## 2018-06-19 ENCOUNTER — Other Ambulatory Visit: Payer: Self-pay | Admitting: Physician Assistant

## 2018-06-19 ENCOUNTER — Telehealth: Payer: Self-pay

## 2018-06-19 MED ORDER — PRAVASTATIN SODIUM 80 MG PO TABS: 80.0000 mg | ORAL_TABLET | Freq: Every day | ORAL | 1 refills | Status: DC

## 2018-06-19 NOTE — Telephone Encounter (Signed)
[facility] faxed refill request for the following medications:  pravastatin (PRAVACHOL) 80 MG tablet  Please advise.  Thanks, [employer]

## 2018-06-20 ENCOUNTER — Other Ambulatory Visit: Payer: Self-pay

## 2018-06-20 ENCOUNTER — Ambulatory Visit: Payer: Self-pay | Admitting: Pharmacist

## 2018-06-20 DIAGNOSIS — E119 Type 2 diabetes mellitus without complications: Secondary | ICD-10-CM

## 2018-06-20 NOTE — Chronic Care Management (AMB) (Signed)
Care Management   Note  06/20/2018 Name: Chelsea Gomez MRN: [id number] DOB: 09-21-1955  Reason for referral: Rationing medications  Referral source: Carles Collet, PA-C Referral medication(s): Invokana, Januvia, pravastatin Current insurance:UHC commercial plan  PMHx: T2DM, HTN, GERD   HPI: [name] is a 63 year old female patient of Carles Collet, PA-C. Successful outreach today following referral from Fabio Bering stating that patient was having difficulty affording a number of medications and "rationing" them (ie not taking every day). HIPAA identifiers verified.   Objective: Allergies  Allergen Reactions  . Mevacor [Lovastatin] Other (See Comments)    Muscle cramps    Medications Reviewed Today    Reviewed by Edd Arbour, CMA (Certified Medical Assistant) on 03/14/18 at 1132  Med List Status: <None>  Medication Order Taking? Sig Documenting Provider Last Dose Status Informant  amLODipine (NORVASC) 5 MG tablet [id number]  TAKE ONE TABLET BY MOUTH ONCE DAILY Carmon Ginsberg, [place]  Active   aspirin 81 MG tablet [id number]  Take 81 mg by mouth daily. [provider]  Active   blood glucose meter kit and supplies KIT [id number]  Dispense based on patient and insurance preference. Fasting and 2 hours after meals weekly and as needed. Carmon Ginsberg, PA  Active   CALCIUM-VITAMIN D PO [id number]  Take 800 mg by mouth daily. [provider]  Active   canagliflozin (INVOKANA) 300 MG TABS tablet [id number]  Take 1 tablet (300 mg total) by mouth daily before breakfast. Carmon Ginsberg, PA  Active   carvedilol (COREG) 6.25 MG tablet [id number]  TAKE ONE TABLET BY MOUTH TWICE DAILY Carmon Ginsberg, PA  Active   cholecalciferol (VITAMIN D) 1000 UNITS tablet [id number]  Take 1,000 Units by mouth daily. [provider]  Active   glipiZIDE (GLUCOTROL) 10 MG tablet [id number]  TAKE 1 TABLET BY MOUTH TWICE DAILY 30 MINUTES BEFORE A MEAL Nuevo, Robert, [place]  Active   glucose  blood test strip [id number]  Twice weekly fasting and 2 hours after a meal. Carmon Ginsberg, PA  Active   JANUVIA 100 MG tablet [id number]  TAKE ONE TABLET BY MOUTH ONCE DAILY Carmon Ginsberg, PA  Active   metFORMIN (GLUCOPHAGE) 1000 MG tablet [id number]  TAKE 1 TABLET BY MOUTH TWICE DAILY Carmon Ginsberg, PA  Active   olopatadine (PATANOL) 0.1 % ophthalmic solution [id number]  INSTILL 1 DROP INTO [facility] EYE TWICE DAILY [provider]  Active   omeprazole (PRILOSEC) 20 MG capsule [id number]  Take 20 mg by mouth as needed. [provider]  Active   pravastatin (PRAVACHOL) 80 MG tablet [id number]  TAKE ONE TABLET BY MOUTH ONCE DAILY Carmon Ginsberg, PA  Active   quinapril (ACCUPRIL) 20 MG tablet [id number]  TAKE 1 TABLET BY MOUTH ONCE DAILY Carmon Ginsberg, PA  Active   vitamin C (ASCORBIC ACID) 500 MG tablet [id number]  Take 500 mg by mouth daily. [provider]  Active           Assessment:   Medication Review Findings:  . Pravastatin inexplicably expensive according to patient; advised to ask [facility] for cash price  Medication Assistance Findings:  Extra Help:   [] Already receiving Full Extra Help  [] Already receiving Partial Extra Help  [] Eligible based on reported income and assets  [x] Not Eligible based on reported income and assets  Patient Assistance Programs       1)  Januvia made by [employer] - Provided information on how to activate Januvia coupon   Additional medication  assistance options reviewed with patient as warranted:  No other options identified  Goals Addressed            This Visit's Progress   . My copay is high for Januvia and pravastatin (pt-stated)       Current Barriers:  . High copays on pravastatin and Januvia  . "stretching out" these medications to make them last longer (taking Januvia every 3 days)  Pharmacist Clinical Goal(s):  Over the next 7 days, Ms. Boyington will successfully activate Januvia coupon and inquire about  pravastatin cash price at her pharmacy, as evidenced by patient report.   Interventions: . Provided coupon for Januvia and instructions on how to activate . Provided information about [facility]'s cash price list . Will consider alternative to pravastatin with a lower copay on patient's insurance or cash price if necessary  Patient Self Care Activities:  . Self administers medications as prescribed  Initial goal documentation        Plan: Patient is to activate coupon for Januvia and ask about cash pricing for pravastatin   Follow up: via telephone within 7 days   Ruben Reason, PharmD Clinical Pharmacist [place] [phone]

## 2018-06-20 NOTE — Patient Instructions (Signed)
Goals Addressed            This Visit's Progress   . My copay is high for Januvia and pravastatin (pt-stated)       Current Barriers:  . High copays on pravastatin and Januvia  . "stretching out" these medications to make them last longer (taking Januvia every 3 days)  Pharmacist Clinical Goal(s):  Over the next 7 days, Chelsea Gomez will successfully activate Januvia coupon and inquire about pravastatin cash price at her pharmacy, as evidenced by patient report.   Interventions: . Provided coupon for Januvia and instructions on how to activate . Provided information about [facility]'s cash price list . Will consider alternative to pravastatin with a lower copay on patient's insurance or cash price if necessary  Patient Self Care Activities:  . Self administers medications as prescribed  Initial goal documentation        The patient verbalized understanding of instructions provided today and declined a print copy of patient instruction materials.

## 2018-06-21 ENCOUNTER — Telehealth: Payer: Self-pay

## 2018-06-21 NOTE — Telephone Encounter (Signed)
Please Advise

## 2018-06-21 NOTE — Telephone Encounter (Signed)
[name] from [facility] [place] is requesting call back from [facility] for clarification in regards to medication. Pharmacist states that two prescriptions were sent in for pravastatin and atorvastatin. KW

## 2018-06-21 NOTE — Telephone Encounter (Signed)
Would like her to do the lipitor please. p

## 2018-06-24 NOTE — Telephone Encounter (Signed)
Spoke to [place] regarding prescription changes

## 2018-08-07 ENCOUNTER — Other Ambulatory Visit: Payer: Self-pay | Admitting: Physician Assistant

## 2018-08-07 DIAGNOSIS — E113299 Type 2 diabetes mellitus with mild nonproliferative diabetic retinopathy without macular edema, unspecified eye: Secondary | ICD-10-CM | POA: Insufficient documentation

## 2018-08-07 LAB — HM DIABETES EYE EXAM

## 2018-08-14 LAB — HM MAMMOGRAPHY

## 2018-09-18 ENCOUNTER — Other Ambulatory Visit: Payer: Self-pay | Admitting: Physician Assistant

## 2018-09-18 ENCOUNTER — Other Ambulatory Visit (HOSPITAL_COMMUNITY)
Admission: RE | Admit: 2018-09-18 | Discharge: 2018-09-18 | Disposition: A | Discharge status: Home / Self Care | Payer: 59 | Source: Ambulatory Visit | Attending: Physician Assistant | Admitting: Physician Assistant

## 2018-09-18 ENCOUNTER — Encounter: Payer: Self-pay | Admitting: Physician Assistant

## 2018-09-18 ENCOUNTER — Ambulatory Visit (INDEPENDENT_AMBULATORY_CARE_PROVIDER_SITE_OTHER): Payer: 59 | Admitting: Physician Assistant

## 2018-09-18 ENCOUNTER — Other Ambulatory Visit: Payer: Self-pay

## 2018-09-18 VITALS — BP 146/81 | HR 86 | Temp 99.1°F | Resp 16 | Ht 62.0 in | Wt 231.6 lb

## 2018-09-18 DIAGNOSIS — E782 Mixed hyperlipidemia: Secondary | ICD-10-CM

## 2018-09-18 DIAGNOSIS — Z124 Encounter for screening for malignant neoplasm of cervix: Secondary | ICD-10-CM | POA: Diagnosis not present

## 2018-09-18 DIAGNOSIS — R6889 Other general symptoms and signs: Secondary | ICD-10-CM

## 2018-09-18 DIAGNOSIS — Z23 Encounter for immunization: Secondary | ICD-10-CM | POA: Diagnosis not present

## 2018-09-18 DIAGNOSIS — Z0000 Encounter for general adult medical examination without abnormal findings: Secondary | ICD-10-CM | POA: Diagnosis not present

## 2018-09-18 DIAGNOSIS — Z114 Encounter for screening for human immunodeficiency virus [HIV]: Secondary | ICD-10-CM | POA: Diagnosis not present

## 2018-09-18 DIAGNOSIS — I10 Essential (primary) hypertension: Secondary | ICD-10-CM

## 2018-09-18 DIAGNOSIS — E113299 Type 2 diabetes mellitus with mild nonproliferative diabetic retinopathy without macular edema, unspecified eye: Secondary | ICD-10-CM

## 2018-09-18 LAB — POCT GLYCOSYLATED HEMOGLOBIN (HGB A1C)
Est. average glucose Bld gHb Est-mCnc: 160
Hemoglobin A1C: 7.2 % — AB (ref 4.0–5.6)

## 2018-09-18 MED ORDER — LINAGLIPTIN 5 MG PO TABS: 5.0000 mg | ORAL_TABLET | Freq: Every day | ORAL | 1 refills | Status: DC

## 2018-09-18 NOTE — Patient Instructions (Addendum)
[facility]  Health Maintenance, Female Adopting a healthy lifestyle and getting preventive care are important in promoting health and wellness. Ask your health care provider about:  The right schedule for you to have regular tests and exams.  Things you can do on your own to prevent diseases and keep yourself healthy. What should I know about diet, weight, and exercise? Eat a healthy diet   Eat a diet that includes plenty of vegetables, fruits, low-fat dairy products, and lean protein.  Do not eat a lot of foods that are high in solid fats, added sugars, or sodium. Maintain a healthy weight Body mass index (BMI) is used to identify weight problems. It estimates body fat based on height and weight. Your health care provider can help determine your BMI and help you achieve or maintain a healthy weight. Get regular exercise Get regular exercise. This is one of the most important things you can do for your health. Most adults should:  Exercise for at least 150 minutes each week. The exercise should increase your heart rate and make you sweat (moderate-intensity exercise).  Do strengthening exercises at least twice a week. This is in addition to the moderate-intensity exercise.  Spend less time sitting. Even light physical activity can be beneficial. Watch cholesterol and blood lipids Have your blood tested for lipids and cholesterol at 64 years of age, then have this test every 5 years. Have your cholesterol levels checked more often if:  Your lipid or cholesterol levels are high.  You are older than 64 years of age.  You are at high risk for heart disease. What should I know about cancer screening? Depending on your health history and family history, you may need to have cancer screening at various ages. This may include screening for:  Breast cancer.  Cervical cancer.  Colorectal cancer.  Skin cancer.  Lung cancer. What should I know about heart disease, diabetes, and high  blood pressure? Blood pressure and heart disease  High blood pressure causes heart disease and increases the risk of stroke. This is more likely to develop in people who have high blood pressure readings, are of African descent, or are overweight.  Have your blood pressure checked: ? Every 3-5 years if you are 35-65 years of age. ? Every year if you are 96 years old or older. Diabetes Have regular diabetes screenings. This checks your fasting blood sugar level. Have the screening done:  Once every three years after age 52 if you are at a normal weight and have a low risk for diabetes.  More often and at a younger age if you are overweight or have a high risk for diabetes. What should I know about preventing infection? Hepatitis B If you have a higher risk for hepatitis B, you should be screened for this virus. Talk with your health care provider to find out if you are at risk for hepatitis B infection. Hepatitis C Testing is recommended for:  Everyone born from [birth year] through 1966.  Anyone with known risk factors for hepatitis C. Sexually transmitted infections (STIs)  Get screened for STIs, including gonorrhea and chlamydia, if: ? You are sexually active and are younger than 64 years of age. ? You are older than 64 years of age and your health care provider tells you that you are at risk for this type of infection. ? Your sexual activity has changed since you were last screened, and you are at increased risk for chlamydia or gonorrhea. Ask your health care  provider if you are at risk.  Ask your health care provider about whether you are at high risk for HIV. Your health care provider may recommend a prescription medicine to help prevent HIV infection. If you choose to take medicine to prevent HIV, you should first get tested for HIV. You should then be tested every 3 months for as long as you are taking the medicine. Pregnancy  If you are about to stop having your period  (premenopausal) and you may become pregnant, seek counseling before you get pregnant.  Take 400 to 800 micrograms (mcg) of folic acid every day if you become pregnant.  Ask for birth control (contraception) if you want to prevent pregnancy. Osteoporosis and menopause Osteoporosis is a disease in which the bones lose minerals and strength with aging. This can result in bone fractures. If you are 27 years old or older, or if you are at risk for osteoporosis and fractures, ask your health care provider if you should:  Be screened for bone loss.  Take a calcium or vitamin D supplement to lower your risk of fractures.  Be given hormone replacement therapy (HRT) to treat symptoms of menopause. Follow these instructions at home: Lifestyle  Do not use any products that contain nicotine or tobacco, such as cigarettes, e-cigarettes, and chewing tobacco. If you need help quitting, ask your health care provider.  Do not use street drugs.  Do not share needles.  Ask your health care provider for help if you need support or information about quitting drugs. Alcohol use  Do not drink alcohol if: ? Your health care provider tells you not to drink. ? You are pregnant, may be pregnant, or are planning to become pregnant.  If you drink alcohol: ? Limit how much you use to 0-1 drink a day. ? Limit intake if you are breastfeeding.  Be aware of how much alcohol is in your drink. In the [place] one drink equals one 12 oz bottle of beer (355 mL), one 5 oz glass of wine (148 mL), or one 1 oz glass of hard liquor (44 mL). General instructions  Schedule regular health, dental, and eye exams.  Stay current with your vaccines.  Tell your health care provider if: ? You often feel depressed. ? You have ever been abused or do not feel safe at home. Summary  Adopting a healthy lifestyle and getting preventive care are important in promoting health and wellness.  Follow your health care provider's  instructions about healthy diet, exercising, and getting tested or screened for diseases.  Follow your health care provider's instructions on monitoring your cholesterol and blood pressure. This information is not intended to replace advice given to you by your health care provider. Make sure you discuss any questions you have with your health care provider. Document Released: 09/05/2010 Document Revised: 02/13/2018 Document Reviewed: 02/13/2018 Elsevier Patient Education  2020 [employer].
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
## 2018-09-19 LAB — HIV ANTIBODY (ROUTINE TESTING W REFLEX): HIV Screen 4th Generation wRfx: NONREACTIVE

## 2018-09-19 LAB — CBC WITH DIFFERENTIAL/PLATELET
Basophils Absolute: 0 10*3/uL (ref 0.0–0.2)
Basos: 1 %
EOS (ABSOLUTE): 0.2 10*3/uL (ref 0.0–0.4)
Eos: 2 %
Hematocrit: 42.7 % (ref 34.0–46.6)
Hemoglobin: 14.2 g/dL (ref 11.1–15.9)
Immature Grans (Abs): 0 10*3/uL (ref 0.0–0.1)
Immature Granulocytes: 0 %
Lymphocytes Absolute: 3.4 10*3/uL — ABNORMAL HIGH (ref 0.7–3.1)
Lymphs: 43 %
MCH: 28.1 pg (ref 26.6–33.0)
MCHC: 33.3 g/dL (ref 31.5–35.7)
MCV: 85 fL (ref 79–97)
Monocytes Absolute: 0.6 10*3/uL (ref 0.1–0.9)
Monocytes: 8 %
Neutrophils Absolute: 3.6 10*3/uL (ref 1.4–7.0)
Neutrophils: 46 %
Platelets: 334 10*3/uL (ref 150–450)
RBC: 5.05 x10E6/uL (ref 3.77–5.28)
RDW: 12.5 % (ref 11.7–15.4)
WBC: 7.8 10*3/uL (ref 3.4–10.8)

## 2018-09-19 LAB — COMPREHENSIVE METABOLIC PANEL
ALT: 12 IU/L (ref 0–32)
AST: 14 IU/L (ref 0–40)
Albumin/Globulin Ratio: 1.4 (ref 1.2–2.2)
Albumin: 4.4 g/dL (ref 3.8–4.8)
Alkaline Phosphatase: 80 IU/L (ref 39–117)
BUN/Creatinine Ratio: 14 (ref 12–28)
BUN: 7 mg/dL — ABNORMAL LOW (ref 8–27)
Bilirubin Total: 0.3 mg/dL (ref 0.0–1.2)
CO2: 21 mmol/L (ref 20–29)
Calcium: 9.7 mg/dL (ref 8.7–10.3)
Chloride: 106 mmol/L (ref 96–106)
Creatinine, Ser: 0.49 mg/dL — ABNORMAL LOW (ref 0.57–1.00)
GFR calc Af Amer: 119 mL/min/{1.73_m2} (ref >59)
GFR calc non Af Amer: 103 mL/min/{1.73_m2} (ref >59)
Globulin, Total: 3.1 g/dL (ref 1.5–4.5)
Glucose: 124 mg/dL — ABNORMAL HIGH (ref 65–99)
Potassium: 4.1 mmol/L (ref 3.5–5.2)
Sodium: 146 mmol/L — ABNORMAL HIGH (ref 134–144)
Total Protein: 7.5 g/dL (ref 6.0–8.5)

## 2018-09-19 LAB — LIPID PANEL
Chol/HDL Ratio: 3.2 ratio (ref 0.0–4.4)
Cholesterol, Total: 119 mg/dL (ref 100–199)
HDL: 37 mg/dL — ABNORMAL LOW (ref >39)
LDL Calculated: 62 mg/dL (ref 0–99)
Triglycerides: 102 mg/dL (ref 0–149)
VLDL Cholesterol Cal: 20 mg/dL (ref 5–40)

## 2018-09-19 LAB — TSH: TSH: 1.91 u[IU]/mL (ref 0.450–4.500)

## 2018-09-21 LAB — CYTOLOGY - PAP
Diagnosis: NEGATIVE
HPV 16/18/45 genotyping: POSITIVE — AB
HPV: DETECTED — AB

## 2018-09-23 ENCOUNTER — Telehealth: Payer: Self-pay

## 2018-09-23 NOTE — Telephone Encounter (Signed)
-----   Message from Trinna Post, [place] sent at 09/23/2018  1:35 PM EDT ----- Her Pap smear doesn't show any abnormal cells but it did come back positive for a high risk HPV. Recommended management of this is to take a sample of the cervix and check for any abnormalities. I can refer her to an Obgyn for this.

## 2018-09-23 NOTE — Telephone Encounter (Signed)
[id number] 

## 2018-09-24 ENCOUNTER — Telehealth: Payer: Self-pay

## 2018-09-24 DIAGNOSIS — R8789 Other abnormal findings in specimens from female genital organs: Secondary | ICD-10-CM

## 2018-09-24 DIAGNOSIS — R87618 Other abnormal cytological findings on specimens from cervix uteri: Secondary | ICD-10-CM

## 2018-09-24 NOTE — Telephone Encounter (Signed)
Referral placed.

## 2018-09-24 NOTE — Telephone Encounter (Signed)
Patient notified of results. She would like to be referred to [facility] for the testing.

## 2018-10-11 ENCOUNTER — Other Ambulatory Visit: Payer: Self-pay | Admitting: Physician Assistant

## 2018-10-11 DIAGNOSIS — E119 Type 2 diabetes mellitus without complications: Secondary | ICD-10-CM

## 2018-10-11 MED ORDER — METFORMIN HCL 1000 MG PO TABS: 1000.0000 mg | ORAL_TABLET | Freq: Two times a day (BID) | ORAL | 1 refills | Status: DC

## 2018-10-11 NOTE — Telephone Encounter (Signed)
[facility] faxed refill request for the following medications:   metFORMIN (GLUCOPHAGE) 1000 MG tablet    Please advise.  

## 2018-10-11 NOTE — Addendum Note (Signed)
Addended by: Trinna Post on: 10/11/2018 04:26 PM   Modules accepted: Orders

## 2018-10-15 ENCOUNTER — Telehealth: Payer: Self-pay

## 2018-10-15 DIAGNOSIS — E113299 Type 2 diabetes mellitus with mild nonproliferative diabetic retinopathy without macular edema, unspecified eye: Secondary | ICD-10-CM

## 2018-10-15 MED ORDER — LINAGLIPTIN 5 MG PO TABS: 5.0000 mg | ORAL_TABLET | Freq: Every day | ORAL | 1 refills | Status: DC

## 2018-10-15 NOTE — Telephone Encounter (Signed)
Patient is requesting a RX for linagliptin (TRADJENTA) 5 MG TABS tablet  be sent to [facility]. Patient states she finished the samples that she was given at her last OV.

## 2018-10-15 NOTE — Telephone Encounter (Signed)
Just sent in 90 days with 1 refill.

## 2018-10-18 ENCOUNTER — Encounter: Payer: 59 | Admitting: Certified Nurse Midwife

## 2018-10-23 ENCOUNTER — Other Ambulatory Visit: Payer: Self-pay

## 2018-10-23 ENCOUNTER — Other Ambulatory Visit (HOSPITAL_COMMUNITY)
Admission: RE | Admit: 2018-10-23 | Discharge: 2018-10-23 | Disposition: A | Discharge status: Home / Self Care | Payer: 59 | Source: Ambulatory Visit | Attending: Obstetrics and Gynecology | Admitting: Obstetrics and Gynecology

## 2018-10-23 ENCOUNTER — Encounter: Payer: Self-pay | Admitting: Obstetrics and Gynecology

## 2018-10-23 ENCOUNTER — Ambulatory Visit (INDEPENDENT_AMBULATORY_CARE_PROVIDER_SITE_OTHER): Payer: 59 | Admitting: Obstetrics and Gynecology

## 2018-10-23 VITALS — BP 136/81 | HR 96 | Ht 62.0 in | Wt 233.9 lb

## 2018-10-23 DIAGNOSIS — N72 Inflammatory disease of cervix uteri: Secondary | ICD-10-CM | POA: Diagnosis not present

## 2018-10-23 DIAGNOSIS — B977 Papillomavirus as the cause of diseases classified elsewhere: Secondary | ICD-10-CM | POA: Insufficient documentation
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
## 2018-11-06 ENCOUNTER — Ambulatory Visit (INDEPENDENT_AMBULATORY_CARE_PROVIDER_SITE_OTHER): Payer: 59 | Admitting: Obstetrics and Gynecology

## 2018-11-06 ENCOUNTER — Other Ambulatory Visit: Payer: Self-pay

## 2018-11-06 ENCOUNTER — Encounter: Payer: Self-pay | Admitting: Obstetrics and Gynecology

## 2018-11-06 VITALS — BP 156/88 | HR 92 | Ht 62.0 in | Wt 234.8 lb

## 2018-11-06 DIAGNOSIS — N879 Dysplasia of cervix uteri, unspecified: Secondary | ICD-10-CM | POA: Diagnosis not present

## 2018-11-06 NOTE — Progress Notes (Signed)
HPI:      [name] is a 63 y.o. G2P1011 who LMP was No LMP recorded. Patient is postmenopausal.  Subjective:   She presents today for follow-up of her colposcopy.  She had normal cytology but viral type 18 on HPV testing.  She presented here and underwent colposcopy.  At the time of colposcopy no acetowhite changes of the exocervix were noted.  An endocervical curettage was performed.    Hx: The following portions of the patient's history were reviewed and updated as appropriate:             She  has a past medical history of Diabetes mellitus without complication ([place]), H/O multiple trauma, Hyperlipidemia, Hypertension, Low back pain, Lung collapse, Shoulder pain, and Thyroid disease. She does not have any pertinent problems on file. She  has a past surgical history that includes Thyroid surgery; Bladder surgery; Cesarean section; Tooth extraction (2016); and Colonoscopy with propofol (N/A, 04/20/2017). Her family history includes Aneurysm in her father; Breast cancer in her sister; Diabetes in her mother and sister; Heart attack in her mother; Stroke in her mother. She  reports that she has quit smoking. Her smoking use included cigarettes. She has never used smokeless tobacco. She reports that she does not drink alcohol or use drugs. She has a current medication list which includes the following prescription(s): amlodipine, aspirin, atorvastatin, calcium-vitamin d, canagliflozin, carvedilol, cholecalciferol, glipizide, linagliptin, metformin, and olopatadine. She is allergic to mevacor [lovastatin].       Review of Systems:  Review of Systems  Constitutional: Denied constitutional symptoms, night sweats, recent illness, fatigue, fever, insomnia and weight loss.  Eyes: Denied eye symptoms, eye pain, photophobia, vision change and visual disturbance.  Ears/Nose/Throat/Neck: Denied ear, nose, throat or neck symptoms, hearing loss, nasal discharge, sinus congestion and sore throat.   Cardiovascular: Denied cardiovascular symptoms, arrhythmia, chest pain/pressure, edema, exercise intolerance, orthopnea and palpitations.  Respiratory: Denied pulmonary symptoms, asthma, pleuritic pain, productive sputum, cough, dyspnea and wheezing.  Gastrointestinal: Denied, gastro-esophageal reflux, melena, nausea and vomiting.  Genitourinary: Denied genitourinary symptoms including symptomatic vaginal discharge, pelvic relaxation issues, and urinary complaints.  Musculoskeletal: Denied musculoskeletal symptoms, stiffness, swelling, muscle weakness and myalgia.  Dermatologic: Denied dermatology symptoms, rash and scar.  Neurologic: Denied neurology symptoms, dizziness, headache, neck pain and syncope.  Psychiatric: Denied psychiatric symptoms, anxiety and depression.  Endocrine: Denied endocrine symptoms including hot flashes and night sweats.   Meds:   Current Outpatient Medications on File Prior to Visit  Medication Sig Dispense Refill  . amLODipine (NORVASC) 5 MG tablet Take 1 tablet by mouth once daily 30 tablet 11  . aspirin 81 MG tablet Take 81 mg by mouth daily.    Marland Kitchen atorvastatin (LIPITOR) 20 MG tablet Take 1 tablet (20 mg total) by mouth daily. 90 tablet 1  . CALCIUM-VITAMIN D PO Take 800 mg by mouth daily.    . canagliflozin (INVOKANA) 300 MG TABS tablet Take 1 tablet (300 mg total) by mouth daily before breakfast. 90 tablet 1  . carvedilol (COREG) 6.25 MG tablet Take 1 tablet by mouth twice daily 60 tablet 11  . cholecalciferol (VITAMIN D) 1000 UNITS tablet Take 1,000 Units by mouth daily.    Marland Kitchen glipiZIDE (GLUCOTROL) 10 MG tablet TAKE 1 TABLET BY MOUTH TWICE DAILY 30 MINUTES BEFORE A MEAL 180 tablet 3  . linagliptin (TRADJENTA) 5 MG TABS tablet Take 1 tablet (5 mg total) by mouth daily. 90 tablet 1  . metFORMIN (GLUCOPHAGE) 1000 MG tablet Take  1 tablet (1,000 mg total) by mouth 2 (two) times daily. 180 tablet 1  . olopatadine (PATANOL) 0.1 % ophthalmic solution INSTILL 1 DROP  INTO EACH EYE TWICE DAILY  3   No current facility-administered medications on file prior to visit.     Objective:     Vitals:   11/06/18 1334 11/06/18 1344  BP: (!) 151/89 (!) 156/88  Pulse: 92               Mild endocervical dysplasia based on ECC  Assessment:    G2P1011 Patient Active Problem List   Diagnosis Date Noted  . Mild nonproliferative diabetic retinopathy ([place]) 08/07/2018  . Diabetes mellitus without complication ([employer]) 123XX123  . GERD (gastroesophageal reflux disease) 08/14/2014  . Hyperlipidemia   . Hypertension   . Lumbar radiculitis 06/19/2012     1. Cervical dysplasia     Mild endocervical based on ECC   Plan:            1.  We have discussed the effect of HPV on cervical cells and its role in cervical cancer.  All questions were answered.  Because the patient has an endocervical dysplasia, not an exocervical dysplasia, and because she is positive for type XVIII I recommended a follow-up colposcopy with ECC in 6 months.  Should this proved to be mild dysplasia again, it is likely that annual Pap smears will suffice following that. I have answered all of her questions regarding cervical dysplasia. 2.  I have discussed her hypertension with her and she states that her blood pressure is usually normal but she did not take her blood pressure medicine today and that is why we are receiving high values.  I have refreshed with her the necessity of taking her medication daily as directed.    Orders  No orders of the defined types were placed in this encounter.   No orders of the defined types were placed in this encounter.     F/U  Return in about 6 months (around 05/06/2019). I spent 17 minutes involved in the care of this patient of which greater than 50% was spent discussing cervical dysplasia, specifically endocervical dysplasia and the need for follow-up.  Type 18 was also discussed in detail and its role as a precancer causing condition.  All of her  questions were answered.  Finis Bud, M.D. 11/06/2018 1:55 PM

## 2018-11-06 NOTE — Progress Notes (Signed)
Patient here to discuss path results, no complaints.

## 2018-11-27 ENCOUNTER — Telehealth: Payer: Self-pay | Admitting: Physician Assistant

## 2018-11-27 DIAGNOSIS — I10 Essential (primary) hypertension: Secondary | ICD-10-CM

## 2018-11-27 MED ORDER — QUINAPRIL HCL 20 MG PO TABS: 20.0000 mg | ORAL_TABLET | Freq: Every day | ORAL | 0 refills | Status: DC

## 2018-11-27 NOTE — Telephone Encounter (Signed)
I don't see this on her med list.  Please advise.   Thanks,   -Jaszmine Navejas  

## 2018-11-27 NOTE — Telephone Encounter (Signed)
[facility] faxed refill request for the following medications:  quinapril (ACCUPRIL) 20 MG tablet     Please advise.

## 2018-11-27 NOTE — Telephone Encounter (Signed)
Sent that in

## 2018-12-04 ENCOUNTER — Telehealth: Payer: Self-pay

## 2018-12-04 NOTE — Telephone Encounter (Signed)
Can we call patient and let her know that Anastasio Auerbach has been denied by insurance. She has an appointment on 12/19/2018 and we can discuss her options at that visit.

## 2018-12-04 NOTE — Telephone Encounter (Signed)
Patient advised as below.  

## 2018-12-04 NOTE — Telephone Encounter (Signed)
PA for invokana denied. Letter in media tab.

## 2018-12-19 ENCOUNTER — Encounter: Payer: Self-pay | Admitting: Physician Assistant

## 2018-12-19 ENCOUNTER — Other Ambulatory Visit: Payer: Self-pay

## 2018-12-19 ENCOUNTER — Ambulatory Visit: Payer: 59 | Admitting: Physician Assistant

## 2018-12-19 VITALS — BP 113/76 | HR 84 | Temp 96.9°F | Wt 233.0 lb

## 2018-12-19 DIAGNOSIS — E119 Type 2 diabetes mellitus without complications: Secondary | ICD-10-CM | POA: Diagnosis not present

## 2018-12-19 DIAGNOSIS — I10 Essential (primary) hypertension: Secondary | ICD-10-CM | POA: Diagnosis not present

## 2018-12-19 DIAGNOSIS — E782 Mixed hyperlipidemia: Secondary | ICD-10-CM

## 2018-12-19 DIAGNOSIS — Z23 Encounter for immunization: Secondary | ICD-10-CM

## 2018-12-19 LAB — POCT GLYCOSYLATED HEMOGLOBIN (HGB A1C): Hemoglobin A1C: 7.7 % — AB (ref 4.0–5.6)

## 2018-12-19 NOTE — Progress Notes (Signed)
Patient: Chelsea Gomez Female    DOB: [birth year]   63 y.o.   MRN: [id number] Visit Date: 12/19/2018  Today's Provider: Trinna Post, PA-C   Chief Complaint  Patient presents with  . Hypertension  . Diabetes  . Hyperlipidemia   Subjective:     HPI     Diabetes Mellitus Type II, Follow-up:   Lab Results  Component Value Date   HGBA1C 7.7 (A) 12/19/2018   HGBA1C 7.2 (A) 09/18/2018   HGBA1C 7.5 (A) 06/13/2018   Last seen for diabetes 3 months ago.  Management since then includes Changed from Januvia to [place], and instructed to take Invokana daily. She is taking invokana every day and she has noticed a resolution in urinary frequency, which was what compelled her to take it every other day initially. She also takes metformin, glipizide and tradjenta. She has been adamantly opposed to medications like trulicity because they involve injections.  She reports excellent compliance with treatment. She is not having side effects.  Current symptoms include none and have been stable. Home blood sugar records: Are not being checked  Episodes of hypoglycemia? no   Current Insulin Regimen: None  Most Recent Eye Exam: UTD Weight trend: stable Prior visit with dietician: no Current diet: in general, a "healthy" diet   Current exercise: none  ------------------------------------------------------------------------   Hypertension, follow-up:  BP Readings from Last 3 Encounters:  12/19/18 113/76  11/06/18 (!) 156/88  10/23/18 136/81    She was last seen for hypertension 3 months ago.  BP at that visit was 146/81. Management since that visit includes no changes, however BP was slightly elevated at last visit.  May need medication adjustment at this visit.  She reports excellent compliance with treatment. She is not having side effects.  She is exercising. She is adherent to low salt diet.   Outside blood pressures are not being checked. She is experiencing  none.  Patient denies chest pain, fatigue, lower extremity edema and syncope.   Cardiovascular risk factors include diabetes mellitus, dyslipidemia, hypertension and obesity (BMI >= 30 kg/m2).  Use of agents associated with hypertension: none.   ------------------------------------------------------------------------    Lipid/Cholesterol, Follow-up:   Last seen for this 3 months ago.  Management since that visit includes no changes.  Last Lipid Panel:    Component Value Date/Time   CHOL 119 09/18/2018 1014   TRIG 102 09/18/2018 1014   HDL 37 (L) 09/18/2018 1014   CHOLHDL 3.2 09/18/2018 1014   LDLCALC 62 09/18/2018 1014    She reports excellent compliance with treatment. She is not having side effects.   Wt Readings from Last 3 Encounters:  12/19/18 233 lb (105.7 kg)  11/06/18 234 lb 12.8 oz (106.5 kg)  10/23/18 233 lb 14.4 oz (106.1 kg)   Morning: orange juice Coffee (flavored creamer), peanut butter nabs Cereal (forsted flakes, honey nut, rice krispies and cut up a banana) or oatmeal Breakfast sandwich  Lunch: leftovers or going out to eat: [employer] restaurant - cabbage, lima beans, green beans, meat (fried)  Dinner: whatever she feels like, peanut butter sandhwich, tuna fish, salad from [employer] Drinks: diet mountain dew once per day  Snacks: potato chips, pretzels, nabs, trail mix including raisins, cashews, dried cranberries, sesame sticks  ------------------------------------------------------------------------    Allergies  Allergen Reactions  . Mevacor [Lovastatin] Other (See Comments)    Muscle cramps     Current Outpatient Medications:  .  amLODipine (NORVASC) 5 MG tablet, Take  1 tablet by mouth once daily, Disp: 30 tablet, Rfl: 11 .  aspirin 81 MG tablet, Take 81 mg by mouth daily., Disp: , Rfl:  .  atorvastatin (LIPITOR) 20 MG tablet, Take 1 tablet (20 mg total) by mouth daily., Disp: 90 tablet, Rfl: 1 .  canagliflozin (INVOKANA) 300 MG TABS  tablet, Take 1 tablet (300 mg total) by mouth daily before breakfast., Disp: 90 tablet, Rfl: 1 .  carvedilol (COREG) 6.25 MG tablet, Take 1 tablet by mouth twice daily, Disp: 60 tablet, Rfl: 11 .  cholecalciferol (VITAMIN D) 1000 UNITS tablet, Take 1,000 Units by mouth daily., Disp: , Rfl:  .  glipiZIDE (GLUCOTROL) 10 MG tablet, TAKE 1 TABLET BY MOUTH TWICE DAILY 30 MINUTES BEFORE A MEAL, Disp: 180 tablet, Rfl: 3 .  linagliptin (TRADJENTA) 5 MG TABS tablet, Take 1 tablet (5 mg total) by mouth daily., Disp: 90 tablet, Rfl: 1 .  metFORMIN (GLUCOPHAGE) 1000 MG tablet, Take 1 tablet (1,000 mg total) by mouth 2 (two) times daily., Disp: 180 tablet, Rfl: 1 .  olopatadine (PATANOL) 0.1 % ophthalmic solution, INSTILL 1 DROP INTO EACH EYE TWICE DAILY, Disp: , Rfl: 3 .  quinapril (ACCUPRIL) 20 MG tablet, Take 1 tablet (20 mg total) by mouth at bedtime., Disp: 90 tablet, Rfl: 0 .  CALCIUM-VITAMIN D PO, Take 800 mg by mouth daily., Disp: , Rfl:   Review of Systems  Constitutional: Negative.   Respiratory: Negative.   Cardiovascular: Negative.   Gastrointestinal: Negative.   Endocrine: Negative.   Neurological: Negative for dizziness, light-headedness, numbness and headaches.    Social History   Tobacco Use  . Smoking status: Former Smoker    Types: Cigarettes  . Smokeless tobacco: Never Used  Substance Use Topics  . Alcohol use: No      Objective:   BP 113/76 (BP Location: Left Arm, Patient Position: Sitting, Cuff Size: Large)   Pulse 84   Temp (!) 96.9 F (36.1 C) (Temporal)   Wt 233 lb (105.7 kg)   BMI 42.62 kg/m  Vitals:   12/19/18 1045  BP: 113/76  Pulse: 84  Temp: (!) 96.9 F (36.1 C)  TempSrc: Temporal  Weight: 233 lb (105.7 kg)  Body mass index is 42.62 kg/m.   Physical Exam Constitutional:      Appearance: Normal appearance. She is obese.  Cardiovascular:     Rate and Rhythm: Normal rate and regular rhythm.     Heart sounds: Normal heart sounds.  Pulmonary:      Effort: Pulmonary effort is normal.     Breath sounds: Normal breath sounds.  Skin:    General: Skin is warm and dry.  Neurological:     Mental Status: She is alert and oriented to person, place, and time. Mental status is at baseline.  Psychiatric:        Mood and Affect: Mood normal.        Behavior: Behavior normal.      Results for orders placed or performed in visit on 12/19/18  POCT glycosylated hemoglobin (Hb A1C)  Result Value Ref Range   Hemoglobin A1C 7.7 (A) 4.0 - 5.6 %       Assessment & Plan    1. Essential hypertension  Well controlled, continue medication.    2. Diabetes mellitus without complication ([place]) Uncontrolled on maximum oral medication. Counseled patient that there are no more oral medications to give with the exception of Oral GLP1 which I doubt her insurance will cover. Reviewed patient's eating  habits and hear diet consists exclusively of carbohydrates and simple sugars. Reviewed a proper diabetic diet but I think she would do best to meet with CCM for diabetes education and dietary counseling.   She refuses GLP1, says she doesn't want to use a needle and that she will work on herself. Counseled that she is risking worsening of her diabetes as well as retinopathy, which she already has, among other complications.   - POCT glycosylated hemoglobin (Hb A1C) - Ambulatory referral to Chronic Care Management Services  3. Mixed hyperlipidemia  Continue Lipitor 20 mg daily.   4. Need for influenza vaccination  - Flu Vaccine QUAD 6+ mos PF IM (Fluarix Quad PF)  5. Morbid obesity ([place])  Counseled it is extremely important to modify her diet and lose weight.   The entirety of the information documented in the History of Present Illness, Review of Systems and Physical Exam were personally obtained by me. Portions of this information were initially documented by Lynford Humphrey, CMA and reviewed by me for thoroughness and accuracy.   F/u 4 months chronic.       Trinna Post, PA-C  [facility]

## 2018-12-19 NOTE — Patient Instructions (Signed)
Diabetes Mellitus and Exercise Exercising regularly is important for your overall health, especially when you have diabetes (diabetes mellitus). Exercising is not only about losing weight. It has many other health benefits, such as increasing muscle strength and bone density and reducing body fat and stress. This leads to improved fitness, flexibility, and endurance, all of which result in better overall health. Exercise has additional benefits for people with diabetes, including:  Reducing appetite.  Helping to lower and control blood glucose.  Lowering blood pressure.  Helping to control amounts of fatty substances (lipids) in the blood, such as cholesterol and triglycerides.  Helping the body to respond better to insulin (improving insulin sensitivity).  Reducing how much insulin the body needs.  Decreasing the risk for heart disease by: ? Lowering cholesterol and triglyceride levels. ? Increasing the levels of good cholesterol. ? Lowering blood glucose levels. What is my activity plan? Your health care provider or certified diabetes educator can help you make a plan for the type and frequency of exercise (activity plan) that works for you. Make sure that you:  Do at least 150 minutes of moderate-intensity or vigorous-intensity exercise each week. This could be brisk walking, biking, or water aerobics. ? Do stretching and strength exercises, such as yoga or weightlifting, at least 2 times a week. ? Spread out your activity over at least 3 days of the week.  Get some form of physical activity every day. ? Do not go more than 2 days in a row without some kind of physical activity. ? Avoid being inactive for more than 30 minutes at a time. Take frequent breaks to walk or stretch.  Choose a type of exercise or activity that you enjoy, and set realistic goals.  Start slowly, and gradually increase the intensity of your exercise over time. What do I need to know about managing my  diabetes?   Check your blood glucose before and after exercising. ? If your blood glucose is 240 mg/dL (13.3 mmol/L) or higher before you exercise, check your urine for ketones. If you have ketones in your urine, do not exercise until your blood glucose returns to normal. ? If your blood glucose is 100 mg/dL (5.6 mmol/L) or lower, eat a snack containing 15-20 grams of carbohydrate. Check your blood glucose 15 minutes after the snack to make sure that your level is above 100 mg/dL (5.6 mmol/L) before you start your exercise.  Know the symptoms of low blood glucose (hypoglycemia) and how to treat it. Your risk for hypoglycemia increases during and after exercise. Common symptoms of hypoglycemia can include: ? Hunger. ? Anxiety. ? Sweating and feeling clammy. ? Confusion. ? Dizziness or feeling light-headed. ? Increased heart rate or palpitations. ? Blurry vision. ? Tingling or numbness around the mouth, lips, or tongue. ? Tremors or shakes. ? Irritability.  Keep a rapid-acting carbohydrate snack available before, during, and after exercise to help prevent or treat hypoglycemia.  Avoid injecting insulin into areas of the body that are going to be exercised. For example, avoid injecting insulin into: ? The arms, when playing tennis. ? The legs, when jogging.  Keep records of your exercise habits. Doing this can help you and your health care provider adjust your diabetes management plan as needed. Write down: ? Food that you eat before and after you exercise. ? Blood glucose levels before and after you exercise. ? The type and amount of exercise you have done. ? When your insulin is expected to peak, if you use   insulin. Avoid exercising at times when your insulin is peaking.  When you start a new exercise or activity, work with your health care provider to make sure the activity is safe for you, and to adjust your insulin, medicines, or food intake as needed.  Drink plenty of water while  you exercise to prevent dehydration or heat stroke. Drink enough fluid to keep your urine clear or pale yellow. Summary  Exercising regularly is important for your overall health, especially when you have diabetes (diabetes mellitus).  Exercising has many health benefits, such as increasing muscle strength and bone density and reducing body fat and stress.  Your health care provider or certified diabetes educator can help you make a plan for the type and frequency of exercise (activity plan) that works for you.  When you start a new exercise or activity, work with your health care provider to make sure the activity is safe for you, and to adjust your insulin, medicines, or food intake as needed. This information is not intended to replace advice given to you by your health care provider. Make sure you discuss any questions you have with your health care provider. Document Released: 05/13/2003 Document Revised: 09/14/2016 Document Reviewed: 08/02/2015 Elsevier Patient Education  2020 [employer] Inc.  

## 2018-12-24 ENCOUNTER — Telehealth: Payer: Self-pay | Admitting: Physician Assistant

## 2018-12-24 NOTE — Chronic Care Management (AMB) (Signed)
Call made in error

## 2019-01-29 ENCOUNTER — Other Ambulatory Visit: Payer: Self-pay

## 2019-01-29 DIAGNOSIS — Z20822 Contact with and (suspected) exposure to covid-19: Secondary | ICD-10-CM

## 2019-01-30 LAB — NOVEL CORONAVIRUS, NAA: SARS-CoV-2, NAA: DETECTED — AB

## 2019-01-31 ENCOUNTER — Telehealth: Payer: Self-pay | Admitting: Unknown Physician Specialty

## 2019-01-31 NOTE — Telephone Encounter (Signed)
Discussed Monoclonal antibody infusion.  She is not interested at this time. Mild symptoms of only loss of taste

## 2019-02-03 ENCOUNTER — Telehealth: Payer: Self-pay

## 2019-02-03 NOTE — Telephone Encounter (Signed)
Copied from [facility] #[id number]. Topic: General - Inquiry >> Jan 31, 2019  4:16 PM Richardo Priest, [place] wrote: Reason for CRM: Pt called in stating she did test positive for COVID-19 and would like to make PCP aware. Please advise.

## 2019-02-04 ENCOUNTER — Other Ambulatory Visit: Payer: Self-pay | Admitting: Physician Assistant

## 2019-02-04 DIAGNOSIS — E1169 Type 2 diabetes mellitus with other specified complication: Secondary | ICD-10-CM

## 2019-02-04 DIAGNOSIS — E785 Hyperlipidemia, unspecified: Secondary | ICD-10-CM

## 2019-02-04 NOTE — Telephone Encounter (Signed)
[facility] faxed refill request for the following medications:  canagliflozin (INVOKANA) 300 MG TABS tablet   Last Rx: 12/27/2017 with 1 refill written by Mikki Santee LOV: 12/19/2018 NOV: 04/23/2019 Please advise. Thanks TNP

## 2019-02-05 MED ORDER — CANAGLIFLOZIN 300 MG PO TABS: 300.0000 mg | ORAL_TABLET | Freq: Every day | ORAL | 1 refills | Status: DC

## 2019-02-05 NOTE — Telephone Encounter (Signed)
L.O.V. was on 12/19/2018. 

## 2019-02-24 LAB — HM DIABETES EYE EXAM

## 2019-03-11 ENCOUNTER — Ambulatory Visit: Payer: Self-pay

## 2019-03-11 ENCOUNTER — Other Ambulatory Visit: Payer: Self-pay

## 2019-03-11 DIAGNOSIS — E119 Type 2 diabetes mellitus without complications: Secondary | ICD-10-CM

## 2019-03-11 NOTE — Patient Instructions (Addendum)
Thank you for allowing the Chronic Care Management team to participate in your care.  Goals Addressed            This Visit's Progress   . Improved Diabetes Management       Current Barriers:  . Care Coordination needs related to Diabetes self management.  Case Manager Clinical Goal(s):  Marland Kitchen Over the next 90 days, patient will monitor blood glucose levels daily and maintain a daily log. . Over the next 90 days, patient will take all medications as prescribed. . Over the next 90 days, patient will attend all medical appointments as scheduled. . Over the next 90 days, patient will improve adherence with recommended cardiac prudent/diabetic diet. . Over the next 90 days, patient will increase activity level and engage in mild/low impact exercises.   Interventions:  . Reviewed medications. Encouraged to take all medications as prescribed. Encouraged to keep provider and care management team of concerns regarding prescription costs. Reports previously seeking medication assistance. Reports self administering medications. . Provided education regarding hyperglycemia and hypoglycemia along with recommended interventions. Discussed indications for seeking immediate medical attention. Advised to monitor her blood glucose levels daily. Encouraged to maintain a daily log to identify trends. Reports not monitoring her blood glucose due to not having additional strips. Reports her insurance company will not cover strips for her current glucometer. Anticipates contacting insurance company to discuss options. . Discussed current dietary intake and options for maintaining a cardiac prudent/diabetic diet. Reports not adhering to recommended diet but attempting to make healthier food choices. Reports losing her sense of taste and smell d/t Covid-19. Denies changes in appetite or ability to tolerate meals. . Discussed current activity level. Encouraged to engage in mild/low impact exercises. . Reviewed pending  medical appointments. Encouraged to attend as scheduled to prevent delays in care. Denies concerns regarding transportation. . Discussed plan for ongoing care management and follow up. Provided direct contact information for care management team.    Patient Self Care Activities:  . Self administers medications  . Attends scheduled provider appointments . Calls pharmacy for medication refills . Attends church or other social activities . Performs ADL's independently . Performs IADL's independently . Calls provider office for new concerns or questions   Initial goal documentation        Ms. Juhasz was given information about Care Management services  including:  1. Care Management services include personalized support from designated clinical staff supervised by a physician, including individualized plan of care and coordination with other care providers 2. 24/7 contact phone numbers for assistance for urgent and routine care needs. 3. The patient may stop Care Management services at any time (effective at the end of the month) by phone call to the office staff.  Patient agreed to services and verbal consent obtained.   Ms. Benke verbalized understanding of the instructions provided during the telephonic outreach today. Declined need for a printed/mailed copy of the instructions.   The care management team will follow up with Ms. Daigler next month.   Hor[facility] Care Management [phone]

## 2019-03-11 NOTE — Chronic Care Management (AMB) (Signed)
Care Management   Initial Visit Note  03/11/2019 Name: Chelsea Gomez MRN: [id number] DOB: 1955-08-23    Chelsea Gomez is a 64 y.o. year old female who sees Chelsea Gomez, [place] for primary care. The care management team was consulted for assistance with care management and care coordination needs related to diabetes management.  Review of patient status, including review of consultants reports, relevant laboratory and other test results, and collaboration with appropriate care team members and the patient's provider was performed as part of comprehensive patient evaluation and provision of care management services.    SDOH (Social Determinants of Health) screening performed today.   Outpatient Encounter Medications as of 03/11/2019  Medication Sig  . amLODipine (NORVASC) 5 MG tablet Take 1 tablet by mouth once daily  . aspirin 81 MG tablet Take 81 mg by mouth daily.  Marland Kitchen atorvastatin (LIPITOR) 20 MG tablet Take 1 tablet by mouth once daily  . canagliflozin (INVOKANA) 300 MG TABS tablet Take 1 tablet (300 mg total) by mouth daily before breakfast.  . carvedilol (COREG) 6.25 MG tablet Take 1 tablet by mouth twice daily  . cholecalciferol (VITAMIN D) 1000 UNITS tablet Take 1,000 Units by mouth daily.  Marland Kitchen glipiZIDE (GLUCOTROL) 10 MG tablet TAKE 1 TABLET BY MOUTH TWICE DAILY 30 MINUTES BEFORE A MEAL  . linagliptin (TRADJENTA) 5 MG TABS tablet Take 1 tablet (5 mg total) by mouth daily.  . metFORMIN (GLUCOPHAGE) 1000 MG tablet Take 1 tablet (1,000 mg total) by mouth 2 (two) times daily.  Marland Kitchen olopatadine (PATANOL) 0.1 % ophthalmic solution INSTILL 1 DROP INTO EACH EYE TWICE DAILY  . CALCIUM-VITAMIN D PO Take 800 mg by mouth daily.  . quinapril (ACCUPRIL) 20 MG tablet Take 1 tablet (20 mg total) by mouth at bedtime.   No facility-administered encounter medications on file as of 03/11/2019.     Lab Results  Component Value Date   HGBA1C 7.7 (A) 12/19/2018    BP Readings from Last 3 Encounters:   12/19/18 113/76  11/06/18 (!) 156/88  10/23/18 136/81    Wt Readings from Last 3 Encounters:  12/19/18 233 lb (105.7 kg)  11/06/18 234 lb 12.8 oz (106.5 kg)  10/23/18 233 lb 14.4 oz (106.1 kg)    Lab Results  Component Value Date   CHOL 119 09/18/2018   CHOL 197 08/24/2017   CHOL 170 08/17/2016   Lab Results  Component Value Date   HDL 37 (L) 09/18/2018   HDL 39 (L) 08/24/2017   HDL 43 08/17/2016   Lab Results  Component Value Date   LDLCALC 62 09/18/2018   LDLCALC 120 (H) 08/24/2017   LDLCALC 105 (H) 08/17/2016   Lab Results  Component Value Date   TRIG 102 09/18/2018   TRIG 191 (H) 08/24/2017   TRIG 108 08/17/2016   Lab Results  Component Value Date   CHOLHDL 3.2 09/18/2018   CHOLHDL 5.1 (H) 08/24/2017   CHOLHDL 4.0 08/17/2016     Depression screen PHQ 2/9 03/11/2019 09/18/2018 03/14/2018  Decreased Interest 0 0 0  Down, Depressed, Hopeless 0 0 0  PHQ - 2 Score 0 0 0  Altered sleeping - 1 -  Tired, decreased energy - 0 -  Change in appetite - 1 -  Feeling bad or failure about yourself  - 0 -  Trouble concentrating - 0 -  Moving slowly or fidgety/restless - 0 -  Suicidal thoughts - 0 -  PHQ-9 Score - 2 -  Difficult doing work/chores -  Not difficult at all -       Goals Addressed            This Visit's Progress   . Improved Diabetes Management       Current Barriers:  . Care Coordination needs related to Diabetes self management.  Case Manager Clinical Goal(s):  Marland Kitchen Over the next 90 days, patient will monitor blood glucose levels daily and maintain a daily log. . Over the next 90 days, patient will take all medications as prescribed. . Over the next 90 days, patient will attend all medical appointments as scheduled. . Over the next 90 days, patient will improve adherence with recommended cardiac prudent/diabetic diet. . Over the next 90 days, patient will increase activity level and engage in mild/low impact exercises.   Interventions:   . Reviewed medications. Encouraged to take all medications as prescribed. Encouraged to keep provider and care management team of concerns regarding prescription costs. Reports previously seeking medication assistance. Reports self administering medications. . Provided education regarding hyperglycemia and hypoglycemia along with recommended interventions. Discussed indications for seeking immediate medical attention. Advised to monitor her blood glucose levels daily. Encouraged to maintain a daily log to identify trends. Reports not monitoring her blood glucose due to not having additional strips. Reports her insurance company will not cover strips for her current glucometer. Anticipates contacting insurance company to discuss options. . Discussed current dietary intake and options for maintaining a cardiac prudent/diabetic diet. Reports not adhering to recommended diet but attempting to make healthier food choices. Reports losing her sense of taste and smell d/t Covid-19. Denies changes in appetite or ability to tolerate meals. . Discussed current activity level. Encouraged to engage in mild/low impact exercises. . Reviewed pending medical appointments. Encouraged to attend as scheduled to prevent delays in care. Denies concerns regarding transportation. . Discussed plan for ongoing care management and follow up. Provided direct contact information for care management team.    Patient Self Care Activities:  . Self administers medications  . Attends scheduled provider appointments . Calls pharmacy for medication refills . Attends church or other social activities . Performs ADL's independently . Performs IADL's independently . Calls provider office for new concerns or questions   Initial goal documentation       Chelsea Gomez was given information about Care Management services  including:  1. Care Management services include personalized support from designated clinical staff supervised by a  physician, including individualized plan of care and coordination with other care providers 2. 24/7 contact phone numbers for assistance for urgent and routine care needs. 3. The patient may stop Care Management services at any time (effective at the end of the month) by phone call to the office staff.  Patient agreed to services and verbal consent obtained.    Follow up plan:  The care management team will follow up with Ms. Friar next month.    Horris Latino [facility] Care Management [phone]

## 2019-03-29 ENCOUNTER — Other Ambulatory Visit: Payer: Self-pay | Admitting: Physician Assistant

## 2019-03-29 DIAGNOSIS — I10 Essential (primary) hypertension: Secondary | ICD-10-CM

## 2019-03-29 NOTE — Telephone Encounter (Signed)
Requested Prescriptions  Pending Prescriptions Disp Refills  . quinapril (ACCUPRIL) 20 MG tablet [Pharmacy Med Name: Quinapril HCl 20 MG Oral Tablet] 90 tablet 0    Sig: TAKE 1 TABLET BY MOUTH AT BEDTIME     Cardiovascular:  ACE Inhibitors Failed - 03/29/2019  4:38 PM      Failed - Cr in normal range and within 180 days    Creatinine  Date Value Ref Range Status  12/16/2011 0.61 0.60 - 1.30 mg/dL Final   Creatinine, Ser  Date Value Ref Range Status  09/18/2018 0.49 (L) 0.57 - 1.00 mg/dL Final         Failed - K in normal range and within 180 days    Potassium  Date Value Ref Range Status  09/18/2018 4.1 3.5 - 5.2 mmol/L Final  12/16/2011 3.5 3.5 - 5.1 mmol/L Final         Passed - Patient is not pregnant      Passed - Last BP in normal range    BP Readings from Last 1 Encounters:  12/19/18 113/76         Passed - Valid encounter within last 6 months    Recent Outpatient Visits          3 months ago Essential hypertension   [place], [place], PA-C   6 months ago Annual physical exam   [place], [place], [place]   9 months ago Diabetes mellitus without complication [facility])   [facility] [place], [place], PA-C   1 year ago Diabetes mellitus without complication [facility])   [place], [place]   1 year ago Diabetes mellitus without complication [facility])   [facility], [place]bie [place], [place]      Future Appointments            In 3 weeks Terrilee Croak, Wendee Beavers, PA-C [employer], [facility]

## 2019-04-09 ENCOUNTER — Telehealth: Payer: Self-pay

## 2019-04-09 ENCOUNTER — Other Ambulatory Visit: Payer: Self-pay | Admitting: Physician Assistant

## 2019-04-09 DIAGNOSIS — E119 Type 2 diabetes mellitus without complications: Secondary | ICD-10-CM

## 2019-04-09 DIAGNOSIS — E113299 Type 2 diabetes mellitus with mild nonproliferative diabetic retinopathy without macular edema, unspecified eye: Secondary | ICD-10-CM

## 2019-04-09 MED ORDER — JARDIANCE 25 MG PO TABS: 25.0000 mg | ORAL_TABLET | Freq: Every day | ORAL | 1 refills | Status: DC

## 2019-04-09 MED ORDER — JARDIANCE 25 MG PO TABS: 25.0000 mg | ORAL_TABLET | Freq: Every day | ORAL | 90 refills | Status: DC

## 2019-04-09 NOTE — Telephone Encounter (Signed)
Received a fax from [place] requesting a switch in patient medication due to insurance not covering patients Invokana anymore. Patient agreed with changing her medication to Jardiance 25 MG daily from [place] 300MG . Medication was send into pharmacy.

## 2019-04-23 ENCOUNTER — Encounter: Payer: Self-pay | Admitting: Physician Assistant

## 2019-04-23 ENCOUNTER — Ambulatory Visit: Payer: 59 | Admitting: Physician Assistant

## 2019-04-23 ENCOUNTER — Other Ambulatory Visit: Payer: Self-pay

## 2019-04-23 VITALS — BP 120/80 | HR 98 | Temp 95.3°F | Wt 232.6 lb

## 2019-04-23 DIAGNOSIS — I10 Essential (primary) hypertension: Secondary | ICD-10-CM

## 2019-04-23 DIAGNOSIS — E119 Type 2 diabetes mellitus without complications: Secondary | ICD-10-CM

## 2019-04-23 DIAGNOSIS — Z8616 Personal history of COVID-19: Secondary | ICD-10-CM

## 2019-04-23 DIAGNOSIS — E113299 Type 2 diabetes mellitus with mild nonproliferative diabetic retinopathy without macular edema, unspecified eye: Secondary | ICD-10-CM | POA: Diagnosis not present

## 2019-04-23 LAB — POCT GLYCOSYLATED HEMOGLOBIN (HGB A1C)
Est. average glucose Bld gHb Est-mCnc: 180
Hemoglobin A1C: 7.9 % — AB (ref 4.0–5.6)

## 2019-04-23 NOTE — Progress Notes (Signed)
Patient: Chelsea Gomez Female    DOB: 10/12/1955   64 y.o.   MRN: [id number] Visit Date: 04/23/2019  Today's Provider: Trinna Post, PA-C   Chief Complaint  Patient presents with  . Diabetes  . Hypertension  . Hyperlipidemia   Subjective:     HPI   Diabetes Mellitus Type II, Follow-up:   Patient got COVID in November. She said she couldn't taste or smell. Was not hospitalized and did not suffer any other consequences except persistent loss of taste and smell. She says she has not been focusing on her diabetes because even though she couldn't taste and smell she wanted to focus on eating and keeping herself nourished because she reports she might have died. She drank gingerale and gatorade frequently. Wants to know what she should do if her taste doesn't come back.  Lab Results  Component Value Date   HGBA1C 7.9 (A) 04/23/2019   HGBA1C 7.7 (A) 12/19/2018   HGBA1C 7.2 (A) 09/18/2018   Last seen for diabetes 4 months ago.  Management since then includes diabetic diet and visit with CCM. She reports good compliance with treatment. She is not having side effects.  Current symptoms include none and have been stable. Home blood sugar records: not checked  Episodes of hypoglycemia? no   Current Insulin Regimen: none Most Recent Eye Exam: UTD Weight trend: stable Prior visit with dietician: no Current diet: well balanced Current exercise: none     ------------------------------------------------------------------------   Hypertension, follow-up:  BP Readings from Last 3 Encounters:  04/23/19 120/80  12/19/18 113/76  11/06/18 (!) 156/88    She was last seen for hypertension 4 months ago.  BP at that visit was 113/76. Management since that visit includes No changes.She reports good compliance with treatment. She is not having side effects.  She is not exercising. She is adherent to low salt diet.   Outside blood pressures are not checked. She is  experiencing none.  Patient denies chest pain, chest pressure/discomfort, exertional chest pressure/discomfort, fatigue, irregular heart beat and palpitations.   Cardiovascular risk factors include diabetes mellitus and hypertension.  Use of agents associated with hypertension: none.   ------------------------------------------------------------------------    Lipid/Cholesterol, Follow-up:   Last seen for this 4 months ago.  Management since that visit includes no changes.  Last Lipid Panel:    Component Value Date/Time   CHOL 119 09/18/2018 1014   TRIG 102 09/18/2018 1014   HDL 37 (L) 09/18/2018 1014   CHOLHDL 3.2 09/18/2018 1014   LDLCALC 62 09/18/2018 1014    She reports good compliance with treatment. She is not having side effects.   Wt Readings from Last 3 Encounters:  04/23/19 232 lb 9.6 oz (105.5 kg)  12/19/18 233 lb (105.7 kg)  11/06/18 234 lb 12.8 oz (106.5 kg)    ------------------------------------------------------------------------    Allergies  Allergen Reactions  . Mevacor [Lovastatin] Other (See Comments)    Muscle cramps     Current Outpatient Medications:  .  amLODipine (NORVASC) 5 MG tablet, Take 1 tablet by mouth once daily, Disp: 30 tablet, Rfl: 11 .  aspirin 81 MG tablet, Take 81 mg by mouth daily., Disp: , Rfl:  .  atorvastatin (LIPITOR) 20 MG tablet, Take 1 tablet by mouth once daily, Disp: 90 tablet, Rfl: 0 .  CALCIUM-VITAMIN D PO, Take 800 mg by mouth daily., Disp: , Rfl:  .  carvedilol (COREG) 6.25 MG tablet, Take 1 tablet by mouth twice daily,  Disp: 60 tablet, Rfl: 11 .  cholecalciferol (VITAMIN D) 1000 UNITS tablet, Take 1,000 Units by mouth daily., Disp: , Rfl:  .  empagliflozin (JARDIANCE) 25 MG TABS tablet, Take 25 mg by mouth daily before breakfast., Disp: 90 tablet, Rfl: 1 .  glipiZIDE (GLUCOTROL) 10 MG tablet, TAKE 1 TABLET BY MOUTH TWICE DAILY 30 MINUTES BEFORE A MEAL, Disp: 180 tablet, Rfl: 3 .  metFORMIN (GLUCOPHAGE) 1000 MG  tablet, Take 1 tablet (1,000 mg total) by mouth 2 (two) times daily., Disp: 180 tablet, Rfl: 1 .  olopatadine (PATANOL) 0.1 % ophthalmic solution, INSTILL 1 DROP INTO EACH EYE TWICE DAILY, Disp: , Rfl: 3 .  quinapril (ACCUPRIL) 20 MG tablet, TAKE 1 TABLET BY MOUTH AT BEDTIME, Disp: 90 tablet, Rfl: 0 .  linagliptin (TRADJENTA) 5 MG TABS tablet, Take 1 tablet (5 mg total) by mouth daily., Disp: 90 tablet, Rfl: 1  Review of Systems  Social History   Tobacco Use  . Smoking status: Former Smoker    Types: Cigarettes  . Smokeless tobacco: Never Used  Substance Use Topics  . Alcohol use: No      Objective:   BP 120/80 (BP Location: Left Arm, Patient Position: Sitting, Cuff Size: Normal)   Pulse 98   Temp (!) 95.3 F (35.2 C) (Temporal)   Wt 232 lb 9.6 oz (105.5 kg)   SpO2 97%   BMI 42.54 kg/m  Vitals:   04/23/19 1019  BP: 120/80  Pulse: 98  Temp: (!) 95.3 F (35.2 C)  TempSrc: Temporal  SpO2: 97%  Weight: 232 lb 9.6 oz (105.5 kg)  Body mass index is 42.54 kg/m.   Physical Exam Constitutional:      Appearance: Normal appearance.  Cardiovascular:     Rate and Rhythm: Normal rate and regular rhythm.     Heart sounds: Normal heart sounds.  Pulmonary:     Effort: Pulmonary effort is normal.     Breath sounds: Normal breath sounds.  Skin:    General: Skin is warm and dry.  Neurological:     Mental Status: She is alert and oriented to person, place, and time. Mental status is at baseline.  Psychiatric:        Mood and Affect: Mood normal.        Behavior: Behavior normal.      Results for orders placed or performed in visit on 04/23/19  POCT glycosylated hemoglobin (Hb A1C)  Result Value Ref Range   Hemoglobin A1C 7.9 (A) 4.0 - 5.6 %   HbA1c POC (<> result, manual entry)     HbA1c, POC (prediabetic range)     HbA1c, POC (controlled diabetic range)     Est. average glucose Bld gHb Est-mCnc 180        Assessment & Plan    1. Diabetes mellitus without  complication ([place])  Uncontrolled and worse from last visit. I have counseled patient on numerous occasions on the consequences of uncontrolled diabetes superimposed with other comorbodities of metabolic syndrome including obesity, HTN and HLD. Patient is on every available oral therapy. She adamantly refuses injectables. She states her A1c is not that bad and if her A1c was 10 or 12 she'd be more concerned. I have advised her the effects of diabetes are cumulative over time and diabetes is known to be a progressive disease. Considering she maintains her morbid obesity her disease state will almost certainly worsen in the coming years and require additional medications. I have advised her repeatedly that if  she took an injectable medication it would surely control her sugar and she may even be able to eliminate an oral medication. She says she will "think about it" which has been her typical response for the last year since I have assumed her care. I have told her that I have advised her to think about it for the past year which she does not seem to be doing and she responds this time she will "really think about it." I have advised her she may need to see endocrinology as I doubt I will be able to help her further.   - POCT glycosylated hemoglobin (Hb A1C)  2. Type 2 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, without long-term current use of insulin, unspecified laterality (HCC)  Uncontrolled. See #1.  3. Morbid obesity ([place])  Uncontrolled. Patient unmotivated to make significant changes.   4. History of COVID-19  Counseled there is no intervention for loss of taste and smell. Counseled she may see an ENT if she wishes but this is likely a consequence of COVID.   5. Essential hypertension  Continue medications.        Trinna Post, PA-C  [facility]

## 2019-04-24 ENCOUNTER — Telehealth: Payer: Self-pay

## 2019-04-24 ENCOUNTER — Ambulatory Visit: Payer: Self-pay

## 2019-04-24 NOTE — Chronic Care Management (AMB) (Signed)
  Chronic Care Management   Outreach Note  04/24/2019 Name: Chelsea Gomez MRN: [id number] DOB: 02-15-1956  Primary Care Provider: Trinna Post, PA-C Reason for referral : Chronic Care Management   An unsuccessful telephone outreach was attempted today. [name] is currently engaged with the Chronic Care Management team receiving assistance with chronic disease management.   Follow Up Plan: The care management team will reach out to [name] again within the next two weeks.    Horris Latino [facility] Care Management [phone]

## 2019-05-06 ENCOUNTER — Telehealth: Payer: Self-pay

## 2019-05-06 ENCOUNTER — Ambulatory Visit: Payer: Self-pay

## 2019-05-06 NOTE — Chronic Care Management (AMB) (Signed)
   Care Management   Outreach Note  05/06/2019 Name: Chelsea Gomez MRN: [id number] DOB: [birth year]  Primary Care Provider: Paulene Floor Reason for referral : Care Management   An unsuccessful telephone outreach was attempted today. [name] is engaged with the Care Management team.  A HIPAA compliant voice message was left today requesting a return call.   Follow Up Plan The care management team will reach out to [name] again in two weeks.  Horris Latino [facility] Care Management [phone]

## 2019-05-07 ENCOUNTER — Other Ambulatory Visit: Payer: Self-pay

## 2019-05-07 ENCOUNTER — Ambulatory Visit (INDEPENDENT_AMBULATORY_CARE_PROVIDER_SITE_OTHER): Payer: 59 | Admitting: Obstetrics and Gynecology

## 2019-05-07 ENCOUNTER — Encounter: Payer: Self-pay | Admitting: Obstetrics and Gynecology

## 2019-05-07 ENCOUNTER — Other Ambulatory Visit (HOSPITAL_COMMUNITY)
Admission: RE | Admit: 2019-05-07 | Discharge: 2019-05-07 | Disposition: A | Discharge status: Home / Self Care | Payer: 59 | Source: Ambulatory Visit | Attending: Obstetrics and Gynecology | Admitting: Obstetrics and Gynecology

## 2019-05-07 VITALS — BP 139/84 | HR 97 | Ht 62.0 in | Wt 232.7 lb

## 2019-05-07 DIAGNOSIS — N72 Inflammatory disease of cervix uteri: Secondary | ICD-10-CM

## 2019-05-07 DIAGNOSIS — N879 Dysplasia of cervix uteri, unspecified: Secondary | ICD-10-CM | POA: Insufficient documentation

## 2019-05-07 DIAGNOSIS — B977 Papillomavirus as the cause of diseases classified elsewhere: Secondary | ICD-10-CM

## 2019-05-07 DIAGNOSIS — R87810 Cervical high risk human papillomavirus (HPV) DNA test positive: Secondary | ICD-10-CM | POA: Diagnosis not present

## 2019-05-07 NOTE — Addendum Note (Signed)
Addended by: Durwin Glaze on: 05/07/2019 11:29 AM   Modules accepted: Orders

## 2019-05-07 NOTE — Progress Notes (Signed)
HPI:  Chelsea Gomez is a 64 y.o.  G2P1011  who presents today for evaluation and management of abnormal cervical cytology.    Dysplasia History: Endocervical dysplasia at colposcopy 6 months ago.  HPV positive type 18  ROS:  Pertinent items are noted in HPI.  OB History  Gravida Para Term Preterm AB Living  2 1 1   1 1   SAB TAB Ectopic Multiple Live Births    1     1    # Outcome Date GA Lbr Len/2nd Weight Sex Delivery Anes PTL Lv  2 Term 24    F CS-LTranv   LIV  1 TAB 1979            Past Medical History:  Diagnosis Date  . Diabetes mellitus without complication ([place])   . H/O multiple trauma   . Hyperlipidemia   . Hypertension   . Low back pain   . Lung collapse   . Shoulder pain    left  . Thyroid disease     Past Surgical History:  Procedure Laterality Date  . BLADDER SURGERY    . CESAREAN SECTION    . COLONOSCOPY WITH PROPOFOL N/A 04/20/2017   Procedure: COLONOSCOPY WITH PROPOFOL;  Surgeon: Jonathon Bellows, MD;  [facility] ENDOSCOPY;  Service: Gastroenterology;  Laterality: N/A;  . THYROID SURGERY    . TOOTH EXTRACTION  2016    SOCIAL HISTORY: Social History   Substance and Sexual Activity  Alcohol Use No   Social History   Substance and Sexual Activity  Drug Use No     Family History  Problem Relation Age of Onset  . Heart attack Mother   . Stroke Mother   . Diabetes Mother   . Aneurysm Father   . Breast cancer Sister   . Diabetes Sister   . Ovarian cancer Neg Hx   . Colon cancer Neg Hx     ALLERGIES:  Mevacor [lovastatin]  She has a current medication list which includes the following prescription(s): amlodipine, aspirin, atorvastatin, calcium-vitamin d, carvedilol, cholecalciferol, jardiance, glipizide, metformin, olopatadine, quinapril, and linagliptin.  Physical Exam: -Vitals:  BP 139/84   Pulse 97   Ht 5\' 2"  (1.575 m)   Wt 232 lb 11.2 oz (105.6 kg)   BMI 42.56 kg/m   PROCEDURE: Colposcopy performed with 4% acetic acid  after verbal consent obtained                           -Aceto-white Lesions Location(s): None o'clock.              -Biopsy performed at None o'clock               -ECC indicated and performed: Yes.       -Biopsy sites made hemostatic with pressure and Monsel's solution   -Satisfactory colposcopy: Yes.      -Evidence of Invasive cervical CA :  NO  ASSESSMENT:  Chelsea Gomez is a 64 y.o. [id number] here for  1. Cervical dysplasia   2. High risk human papilloma virus (HPV) infection of cervix   .  PLAN: 1.  I discussed the grading system of pap smears and HPV high risk viral types.  We will contact her with future management after results return.   No orders of the defined types were placed in this encounter.          F/U  Return for We will contact her  with any abnormal test results.  Jeannie Fend ,MD 05/07/2019,9:30 AM

## 2019-05-09 LAB — SURGICAL PATHOLOGY

## 2019-05-12 ENCOUNTER — Telehealth: Payer: Self-pay | Admitting: Obstetrics and Gynecology

## 2019-05-12 NOTE — Telephone Encounter (Signed)
Patient called following up with her test results.

## 2019-05-12 NOTE — Telephone Encounter (Signed)
Please advise on results

## 2019-05-21 ENCOUNTER — Telehealth: Payer: Self-pay | Admitting: Obstetrics and Gynecology

## 2019-05-21 ENCOUNTER — Other Ambulatory Visit: Payer: Self-pay | Admitting: Physician Assistant

## 2019-05-21 DIAGNOSIS — I10 Essential (primary) hypertension: Secondary | ICD-10-CM

## 2019-05-21 DIAGNOSIS — E119 Type 2 diabetes mellitus without complications: Secondary | ICD-10-CM

## 2019-05-21 MED ORDER — AMLODIPINE BESYLATE 5 MG PO TABS: 5.0000 mg | ORAL_TABLET | Freq: Every day | ORAL | 1 refills | Status: DC

## 2019-05-21 MED ORDER — CARVEDILOL 6.25 MG PO TABS: 6.2500 mg | ORAL_TABLET | Freq: Two times a day (BID) | ORAL | 1 refills | Status: DC

## 2019-05-21 MED ORDER — GLIPIZIDE 10 MG PO TABS: ORAL_TABLET | ORAL | 1 refills | Status: DC

## 2019-05-21 NOTE — Telephone Encounter (Signed)
[facility] faxed refill request for the following medications:   amLODipine (NORVASC) 5 MG tablet carvedilol (COREG) 6.25 MG tablet glipiZIDE (GLUCOTROL) 10 MG tablet  Please advise.  Thanks, [employer]

## 2019-05-21 NOTE — Telephone Encounter (Signed)
Patient called wanting to know about her results. Patient stated she called in last week and still has not heard anything back. Could you please advise the patient?

## 2019-05-22 ENCOUNTER — Ambulatory Visit: Payer: Self-pay

## 2019-05-22 DIAGNOSIS — E113299 Type 2 diabetes mellitus with mild nonproliferative diabetic retinopathy without macular edema, unspecified eye: Secondary | ICD-10-CM

## 2019-05-22 NOTE — Chronic Care Management (AMB) (Signed)
Care Management   Follow Up Note   05/22/2019 Name: Chelsea Gomez MRN: [id number] DOB: [birth year]  Primary Car Provider: Trinna Post, PA-C Reason for referral : Care Management  Chelsea Gomez is a 64 y.o. year old female who is a primary care patient of Trinna Post, [place]. She is currently engaged with the CCM team for ongoing care management. A routine telephonic outreach was conducted today.  Review of Chelsea Gomez status, including review of consultants reports, relevant labs and test results was conducted today. Collaboration with appropriate care team members was performed as part of the comprehensive evaluation and provision of chronic care management services.    SDOH (Social Determinants of Health) assessments performed: Yes See Care Plan activities for detailed interventions related to SDOH)     OBJECTIVE  BP Readings from Last 3 Encounters:  05/07/19 139/84  04/23/19 120/80  12/19/18 113/76    Lab Results  Component Value Date   HGBA1C 7.9 (A) 04/23/2019    Lab Results  Component Value Date   CHOL 119 09/18/2018   HDL 37 (L) 09/18/2018   LDLCALC 62 09/18/2018   TRIG 102 09/18/2018   CHOLHDL 3.2 09/18/2018    Goals Addressed            This Visit's Progress   . Improved Diabetes Management       Current Barriers:  . Care Coordination needs related to Diabetes self management.  Case Manager Clinical Goal(s):  Chelsea Gomez Kitchen Over the next 90 days, patient will monitor blood glucose levels daily and maintain a daily log. . Over the next 90 days, patient will take all medications as prescribed. . Over the next 90 days, patient will attend all medical appointments as scheduled. . Over the next 90 days, patient will improve adherence with recommended cardiac prudent/diabetic diet. . Over the next 90 days, patient will increase activity level and engage in mild/low impact exercises.   Interventions:  . Reviewed medications. Encouraged to take all medications  as prescribed. Encouraged to keep provider and care management team of concerns regarding prescription costs. Reports taking medications as prescribed. . Provided education regarding hyperglycemia and hypoglycemia along with recommended interventions. Discussed indications for seeking immediate medical attention. Advised to monitor her blood glucose levels daily. Encouraged to maintain a daily log to identify trends. She has not monitored her blood glucose and reports still not having a meter. Per [employer], [employer] should be covered. I spoke with representative at [employer] who confirmed that the glucometer can be provided after the insurance company provides an order code. Will follow up to ensure needed information is submitted. . Discussed current dietary intake and options for maintaining a cardiac prudent/diabetic diet. Discussed current activity level. Encouraged to engage in mild/low impact exercises. Reports increased activity. She has returned to work. . Reviewed pending medical appointments. Encouraged to attend as scheduled to prevent delays in care.  . Discussed plan for ongoing care management and follow up. Provided direct contact information for care management team. Informed of plan to involve Embedded Care Guide if additional assistance is required with obtaining a glucometer. She is agreed with this plan.    Patient Self Care Activities:  . Self administers medications  . Attends scheduled provider appointments . Calls pharmacy for medication refills . Attends church or other social activities . Performs ADL's independently . Performs IADL's independently . Calls provider office for new concerns or questions   Please see past updates related to this  goal by clicking on the "Past Updates" button in the selected goal         PLAN The care management team will follow-up with Chelsea Gomez within two weeks.   Horris Latino [facility]  Practice/[facility] Care Management [phone]

## 2019-05-22 NOTE — Telephone Encounter (Signed)
Please advise on results

## 2019-05-22 NOTE — Patient Instructions (Addendum)
Thank you for allowing the Chronic Care Management team to participate in your care.  Goals Addressed            This Visit's Progress   . Improved Diabetes Management       Current Barriers:  . Care Coordination needs related to Diabetes self management.  Case Manager Clinical Goal(s):  Marland Kitchen Over the next 90 days, patient will monitor blood glucose levels daily and maintain a daily log. . Over the next 90 days, patient will take all medications as prescribed. . Over the next 90 days, patient will attend all medical appointments as scheduled. . Over the next 90 days, patient will improve adherence with recommended cardiac prudent/diabetic diet. . Over the next 90 days, patient will increase activity level and engage in mild/low impact exercises.   Interventions:  . Reviewed medications. Encouraged to take all medications as prescribed. Encouraged to keep provider and care management team of concerns regarding prescription costs. Reports taking medications as prescribed. . Provided education regarding hyperglycemia and hypoglycemia along with recommended interventions. Discussed indications for seeking immediate medical attention. Advised to monitor her blood glucose levels daily. Encouraged to maintain a daily log to identify trends. She has not monitored her blood glucose and reports still not having a meter. Per [employer], [employer] should be covered. I spoke with representative at [employer] who confirmed that the glucometer can be provided after the insurance company provides an order code. Will follow up to ensure needed information is submitted. . Discussed current dietary intake and options for maintaining a cardiac prudent/diabetic diet. Discussed current activity level. Encouraged to engage in mild/low impact exercises. Reports increased activity. She has returned to work. . Reviewed pending medical appointments. Encouraged to attend as scheduled to prevent  delays in care.  . Discussed plan for ongoing care management and follow up. Provided direct contact information for care management team. Informed of plan to involve Embedded Care Guide if additional assistance is required with obtaining a glucometer. She is agreed with this plan.    Patient Self Care Activities:  . Self administers medications  . Attends scheduled provider appointments . Calls pharmacy for medication refills . Attends church or other social activities . Performs ADL's independently . Performs IADL's independently . Calls provider office for new concerns or questions   Please see past updates related to this goal by clicking on the "Past Updates" button in the selected goal         Ms. Siracusa verbalized understanding of the instructions provided during the telephonic outreach today. Declined need for printed/mailed instructions.    The care management team will follow up with Ms. Mencer in two weeks.    Horris Latino [facility] Practice/[facility] Care Management [phone]

## 2019-05-26 NOTE — Telephone Encounter (Signed)
Scheduled patient to come in to discuss LEEP with Dr. Amalia Hailey.

## 2019-05-29 ENCOUNTER — Encounter: Payer: Self-pay | Admitting: Obstetrics and Gynecology

## 2019-05-29 ENCOUNTER — Other Ambulatory Visit: Payer: Self-pay

## 2019-05-29 ENCOUNTER — Ambulatory Visit (INDEPENDENT_AMBULATORY_CARE_PROVIDER_SITE_OTHER): Payer: 59 | Admitting: Obstetrics and Gynecology

## 2019-05-29 VITALS — BP 140/81 | HR 109 | Ht 62.0 in | Wt 233.0 lb

## 2019-05-29 DIAGNOSIS — B977 Papillomavirus as the cause of diseases classified elsewhere: Secondary | ICD-10-CM | POA: Diagnosis not present

## 2019-05-29 DIAGNOSIS — N879 Dysplasia of cervix uteri, unspecified: Secondary | ICD-10-CM | POA: Diagnosis not present

## 2019-05-29 DIAGNOSIS — N72 Inflammatory disease of cervix uteri: Secondary | ICD-10-CM | POA: Diagnosis not present

## 2019-05-29 NOTE — Progress Notes (Signed)
HPI:      [name] is a 64 y.o. G2P1011 who LMP was No LMP recorded. Patient is postmenopausal.  Subjective:   She presents today for follow-up of colposcopy.  Exocervix normal endocervical curettage reveals persistent dysplasia.    Hx: The following portions of the patient's history were reviewed and updated as appropriate:             She  has a past medical history of Diabetes mellitus without complication ([place]), H/O multiple trauma, Hyperlipidemia, Hypertension, Low back pain, Lung collapse, Shoulder pain, and Thyroid disease. She does not have any pertinent problems on file. She  has a past surgical history that includes Thyroid surgery; Bladder surgery; Cesarean section; Tooth extraction (2016); and Colonoscopy with propofol (N/A, 04/20/2017). Her family history includes Aneurysm in her father; Breast cancer in her sister; Diabetes in her mother and sister; Heart attack in her mother; Stroke in her mother. She  reports that she has quit smoking. Her smoking use included cigarettes. She has never used smokeless tobacco. She reports that she does not drink alcohol or use drugs. She has a current medication list which includes the following prescription(s): amlodipine, aspirin, atorvastatin, calcium-vitamin d, carvedilol, cholecalciferol, jardiance, glipizide, linagliptin, metformin, olopatadine, and quinapril. She is allergic to mevacor [lovastatin].       Review of Systems:  Review of Systems  Constitutional: Denied constitutional symptoms, night sweats, recent illness, fatigue, fever, insomnia and weight loss.  Eyes: Denied eye symptoms, eye pain, photophobia, vision change and visual disturbance.  Ears/Nose/Throat/Neck: Denied ear, nose, throat or neck symptoms, hearing loss, nasal discharge, sinus congestion and sore throat.  Cardiovascular: Denied cardiovascular symptoms, arrhythmia, chest pain/pressure, edema, exercise intolerance, orthopnea and palpitations.  Respiratory:  Denied pulmonary symptoms, asthma, pleuritic pain, productive sputum, cough, dyspnea and wheezing.  Gastrointestinal: Denied, gastro-esophageal reflux, melena, nausea and vomiting.  Genitourinary: Denied genitourinary symptoms including symptomatic vaginal discharge, pelvic relaxation issues, and urinary complaints.  Musculoskeletal: Denied musculoskeletal symptoms, stiffness, swelling, muscle weakness and myalgia.  Dermatologic: Denied dermatology symptoms, rash and scar.  Neurologic: Denied neurology symptoms, dizziness, headache, neck pain and syncope.  Psychiatric: Denied psychiatric symptoms, anxiety and depression.  Endocrine: Denied endocrine symptoms including hot flashes and night sweats.   Meds:   Current Outpatient Medications on File Prior to Visit  Medication Sig Dispense Refill  . amLODipine (NORVASC) 5 MG tablet Take 1 tablet (5 mg total) by mouth daily. 90 tablet 1  . aspirin 81 MG tablet Take 81 mg by mouth daily.    Marland Kitchen atorvastatin (LIPITOR) 20 MG tablet Take 1 tablet by mouth once daily 90 tablet 0  . CALCIUM-VITAMIN D PO Take 800 mg by mouth daily.    . carvedilol (COREG) 6.25 MG tablet Take 1 tablet (6.25 mg total) by mouth 2 (two) times daily. 180 tablet 1  . cholecalciferol (VITAMIN D) 1000 UNITS tablet Take 1,000 Units by mouth daily.    . empagliflozin (JARDIANCE) 25 MG TABS tablet Take 25 mg by mouth daily before breakfast. 90 tablet 1  . glipiZIDE (GLUCOTROL) 10 MG tablet TAKE 1 TABLET BY MOUTH TWICE DAILY 30 MINUTES BEFORE A MEAL 180 tablet 1  . linagliptin (TRADJENTA) 5 MG TABS tablet Take 1 tablet (5 mg total) by mouth daily. 90 tablet 1  . metFORMIN (GLUCOPHAGE) 1000 MG tablet Take 1 tablet (1,000 mg total) by mouth 2 (two) times daily. 180 tablet 1  . olopatadine (PATANOL) 0.1 % ophthalmic solution INSTILL 1 DROP INTO EACH EYE TWICE  DAILY  3  . quinapril (ACCUPRIL) 20 MG tablet TAKE 1 TABLET BY MOUTH AT BEDTIME 90 tablet 0   No current facility-administered  medications on file prior to visit.    Objective:     Vitals:   05/29/19 1135  BP: 140/81  Pulse: (!) 109              Persistent dysplasia noted at [facility]  Assessment:    G2P1011 Patient Active Problem List   Diagnosis Date Noted  . Type 2 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, without long-term current use of insulin ([place]) 04/23/2019  . Morbid obesity ([place]) 12/19/2018  . Mild nonproliferative diabetic retinopathy ([place]) 08/07/2018  . Diabetes mellitus without complication ([place]) 123XX123  . GERD (gastroesophageal reflux disease) 08/14/2014  . Hyperlipidemia   . Hypertension   . Lumbar radiculitis 06/19/2012     1. Cervical dysplasia   2. High risk human papilloma virus (HPV) infection of cervix     ECC dysplasia -persistent   Plan:            1.  Recommend LEEP of endocervical canal.  Procedure discussed with patient rationale for decision discussed with patient.  She will schedule this in the near future. Orders No orders of the defined types were placed in this encounter.   No orders of the defined types were placed in this encounter.     F/U  Return Patient to schedule LEEP at her convenience. I spent 22 minutes involved in the care of this patient preparing to see the patient by obtaining and reviewing her medical history (including labs, imaging tests and prior procedures), documenting clinical information in the electronic health record (EHR), counseling and coordinating care plans, writing and sending prescriptions, ordering tests or procedures and directly communicating with the patient by discussing pertinent items from her history and physical exam as well as detailing my assessment and plan as noted above so that she has an informed understanding.  All of her questions were answered.  Finis Bud, M.D. 05/29/2019 12:14 PM

## 2019-06-03 ENCOUNTER — Ambulatory Visit: Payer: Self-pay

## 2019-06-13 ENCOUNTER — Other Ambulatory Visit (HOSPITAL_COMMUNITY)
Admission: RE | Admit: 2019-06-13 | Discharge: 2019-06-13 | Disposition: A | Discharge status: Home / Self Care | Payer: 59 | Source: Ambulatory Visit | Attending: Obstetrics and Gynecology | Admitting: Obstetrics and Gynecology

## 2019-06-13 ENCOUNTER — Encounter: Payer: Self-pay | Admitting: Obstetrics and Gynecology

## 2019-06-13 ENCOUNTER — Ambulatory Visit: Payer: 59 | Admitting: Obstetrics and Gynecology

## 2019-06-13 ENCOUNTER — Other Ambulatory Visit: Payer: Self-pay

## 2019-06-13 VITALS — BP 119/68 | HR 93 | Ht 62.0 in | Wt 232.0 lb

## 2019-06-13 DIAGNOSIS — N879 Dysplasia of cervix uteri, unspecified: Secondary | ICD-10-CM

## 2019-06-13 NOTE — Progress Notes (Signed)
HPI:      [name] is a 64 y.o. G2P1011 who LMP was No LMP recorded. Patient is postmenopausal.  Subjective:   She presents today for LEEP.  She has dysplasia of her endocervical canal with no exocervical lesions.    Hx: The following portions of the patient's history were reviewed and updated as appropriate:             She  has a past medical history of Diabetes mellitus without complication ([place]), H/O multiple trauma, Hyperlipidemia, Hypertension, Low back pain, Lung collapse, Shoulder pain, and Thyroid disease. She does not have any pertinent problems on file. She  has a past surgical history that includes Thyroid surgery; Bladder surgery; Cesarean section; Tooth extraction (2016); and Colonoscopy with propofol (N/A, 04/20/2017). Her family history includes Aneurysm in her father; Breast cancer in her sister; Diabetes in her mother and sister; Heart attack in her mother; Stroke in her mother. She  reports that she has quit smoking. Her smoking use included cigarettes. She has never used smokeless tobacco. She reports that she does not drink alcohol or use drugs. She has a current medication list which includes the following prescription(s): amlodipine, aspirin, atorvastatin, calcium-vitamin d, carvedilol, cholecalciferol, jardiance, glipizide, linagliptin, metformin, olopatadine, and quinapril. She is allergic to mevacor [lovastatin].       Review of Systems:  Review of Systems  Constitutional: Denied constitutional symptoms, night sweats, recent illness, fatigue, fever, insomnia and weight loss.  Eyes: Denied eye symptoms, eye pain, photophobia, vision change and visual disturbance.  Ears/Nose/Throat/Neck: Denied ear, nose, throat or neck symptoms, hearing loss, nasal discharge, sinus congestion and sore throat.  Cardiovascular: Denied cardiovascular symptoms, arrhythmia, chest pain/pressure, edema, exercise intolerance, orthopnea and palpitations.  Respiratory: Denied pulmonary  symptoms, asthma, pleuritic pain, productive sputum, cough, dyspnea and wheezing.  Gastrointestinal: Denied, gastro-esophageal reflux, melena, nausea and vomiting.  Genitourinary: Denied genitourinary symptoms including symptomatic vaginal discharge, pelvic relaxation issues, and urinary complaints.  Musculoskeletal: Denied musculoskeletal symptoms, stiffness, swelling, muscle weakness and myalgia.  Dermatologic: Denied dermatology symptoms, rash and scar.  Neurologic: Denied neurology symptoms, dizziness, headache, neck pain and syncope.  Psychiatric: Denied psychiatric symptoms, anxiety and depression.  Endocrine: Denied endocrine symptoms including hot flashes and night sweats.   Meds:   Current Outpatient Medications on File Prior to Visit  Medication Sig Dispense Refill  . amLODipine (NORVASC) 5 MG tablet Take 1 tablet (5 mg total) by mouth daily. 90 tablet 1  . aspirin 81 MG tablet Take 81 mg by mouth daily.    Marland Kitchen atorvastatin (LIPITOR) 20 MG tablet Take 1 tablet by mouth once daily 90 tablet 0  . CALCIUM-VITAMIN D PO Take 800 mg by mouth daily.    . carvedilol (COREG) 6.25 MG tablet Take 1 tablet (6.25 mg total) by mouth 2 (two) times daily. 180 tablet 1  . cholecalciferol (VITAMIN D) 1000 UNITS tablet Take 1,000 Units by mouth daily.    . empagliflozin (JARDIANCE) 25 MG TABS tablet Take 25 mg by mouth daily before breakfast. 90 tablet 1  . glipiZIDE (GLUCOTROL) 10 MG tablet TAKE 1 TABLET BY MOUTH TWICE DAILY 30 MINUTES BEFORE A MEAL 180 tablet 1  . linagliptin (TRADJENTA) 5 MG TABS tablet Take 1 tablet (5 mg total) by mouth daily. 90 tablet 1  . metFORMIN (GLUCOPHAGE) 1000 MG tablet Take 1 tablet (1,000 mg total) by mouth 2 (two) times daily. 180 tablet 1  . olopatadine (PATANOL) 0.1 % ophthalmic solution INSTILL 1 DROP INTO [facility]  EYE TWICE DAILY  3  . quinapril (ACCUPRIL) 20 MG tablet TAKE 1 TABLET BY MOUTH AT BEDTIME 90 tablet 0   No current facility-administered medications on file  prior to visit.    Objective:     Vitals:   06/13/19 1114  BP: 119/68  Pulse: 93              LEEP The LEEP has been explained to the patient in detail; risks/benefits reviewed.  The risks include, but are not limited to, bleeding, infection, and the possibility of cervical stenosis or cervical incompetence.  The patient had previously been given information regarding abnormal PAP smears and their relationship to HPV.  We have discussed the natural course and history of HPV, the possibility of incomplete treatment by the LEEP, as well as the possibility of recurrence.  I have reviewed the consent form for LEEP with her, and she fully understands its contents.  We have discussed the procedure itself. I have informed her that following the LEEP she should refrain from intercourse and the use of tampons for three weeks, and that she should also expect some spotting and brown/black discharge over the next several days.  We have discussed the fact that vaginal bleeding, differentiated from spotting, is not normal and that if she should have this complication, she should contact me immediately.  The follow-up after LEEP will be PAP smears or viral typing performed at regular intervals for up to 3 years.  Should these all prove to be normal, she will then be back on typical cervical screening.  I have answered all of her questions, and I believe she has an adequate understanding of the LEEP, its implications, and the necessity of follow-up care.  LEEP PROCEDURE NOTE  I again discussed her colpo results and explained the procedure of LEEP.  All questions were answered and she signed the consent form.  LEEP performed in the usual manner after reviewing the previous colpo findings and results.  She has dysplasia of the endocervical canal without exocervical dysplasia  The cervix was cleansed with betadine solution. Local injection of Lidocaine was performed for anesthesia. Fletcher current was used  to remove the endocervical canal specimen.  It was labeled accordingly. The base and edges of the defect was then cauterized using coagulation current. Monsel's solution was then applied in the usual manner. Patient tolerated the procedure well without problem.   Assessment:    G2P1011 Patient Active Problem List   Diagnosis Date Noted  . Type 2 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, without long-term current use of insulin ([place]) 04/23/2019  . Morbid obesity ([place]) 12/19/2018  . Mild nonproliferative diabetic retinopathy ([place]) 08/07/2018  . Diabetes mellitus without complication ([place]) 123XX123  . GERD (gastroesophageal reflux disease) 08/14/2014  . Hyperlipidemia   . Hypertension   . Lumbar radiculitis 06/19/2012     1. Cervical dysplasia     Of the endocervical canal   Plan:            1.  LEEP performed follow-up in 1 month. Orders No orders of the defined types were placed in this encounter.   No orders of the defined types were placed in this encounter.     F/U  Return in about 4 weeks (around 07/11/2019).  Finis Bud, M.D. 06/13/2019 11:46 AM

## 2019-06-13 NOTE — Addendum Note (Signed)
Addended by: Edwyna Shell on: 06/13/2019 11:56 AM   Modules accepted: Orders

## 2019-06-13 NOTE — Progress Notes (Signed)
Pt present for LEEP. Pt stated that she was doing well just a little nervous. Pt was given 2 tylenol.

## 2019-06-17 LAB — SURGICAL PATHOLOGY

## 2019-06-24 ENCOUNTER — Ambulatory Visit: Payer: Self-pay

## 2019-06-24 DIAGNOSIS — E113299 Type 2 diabetes mellitus with mild nonproliferative diabetic retinopathy without macular edema, unspecified eye: Secondary | ICD-10-CM

## 2019-06-24 NOTE — Chronic Care Management (AMB) (Signed)
    Care Coordination  06/24/2019 Name: EVVA GARMS MRN: [id number] DOB: [birth year]   Ms. Machak requires additional assistance with obtaining a new glucometer and supplies. Request will be submitted to the Care Guide to assist.     Horris Latino [facility] Practice/[facility] Care Management [phone]

## 2019-06-25 NOTE — Chronic Care Management (AMB) (Signed)
  Care Management   Note   Name: Chelsea Gomez MRN: [id number] DOB: 1955-05-05     Ms. Sawicki indicated that she is willing to use any glucometer provided by her insurance provider. She prefers not to incur out of pocket costs. She has not made additional contact with the insurance provider regarding authorization for a OneTouch glucometer. Contacted [employer]. Staff indicated that the team will gladly assist once the authorization code is provided.   PLAN -Will follow up with Ms. Hanko next month. Will request Care Guide to assist her with the process if needed.  Horris Latino [facility] Practice/[facility] Care Management [phone]

## 2019-07-03 ENCOUNTER — Ambulatory Visit: Payer: Self-pay

## 2019-07-03 NOTE — Chronic Care Management (AMB) (Signed)
  Care Management   Note  07/03/2019 Name: Chelsea Gomez MRN: [id number] DOB: [birth year]    Brief outreach with [name]. Reports being busy at the time of the call. She is pending outreach with a Care Guide to discuss options for obtaining a glucometer. She still prefers that the meter be supplied through her insurance provider to prevent additional out of pocket costs.  PLAN The care management team will follow-up with [name] in two weeks.   Horris Latino [facility] Practice/[facility] Care Management [phone]

## 2019-07-04 ENCOUNTER — Other Ambulatory Visit: Payer: Self-pay | Admitting: Physician Assistant

## 2019-07-04 DIAGNOSIS — E1169 Type 2 diabetes mellitus with other specified complication: Secondary | ICD-10-CM

## 2019-07-15 ENCOUNTER — Telehealth: Payer: Self-pay | Admitting: Physician Assistant

## 2019-07-15 NOTE — Telephone Encounter (Signed)
  Community Resource Referral   [facility] 07/15/2019   [facility]  Name: Chelsea Gomez   MRN: [id number]   DOB: [birth year]   AGE: 64 y.o.   GENDER: female   PCP Trinna Post, PA-C.   Called pt regarding Community Resource Referral regarding glocose meter and strips not being covered by [employer]. Pt stated that she had talked with her pharmacy last year and when she asked about filling a prescription for the glucometer and strips that her insurance did not cover that particular brand(she could not remember the name of it)  [employer] does have a program for their members for  [email or web address]  Pt does not need a prescription for Glucose Meter.   [place] . [email or web address]  [phone]

## 2019-07-16 ENCOUNTER — Other Ambulatory Visit: Payer: Self-pay

## 2019-07-16 ENCOUNTER — Ambulatory Visit (INDEPENDENT_AMBULATORY_CARE_PROVIDER_SITE_OTHER): Payer: 59 | Admitting: Obstetrics and Gynecology

## 2019-07-16 ENCOUNTER — Encounter: Payer: Self-pay | Admitting: Obstetrics and Gynecology

## 2019-07-16 VITALS — BP 125/79 | HR 91 | Ht 62.0 in | Wt 232.4 lb

## 2019-07-16 DIAGNOSIS — N870 Mild cervical dysplasia: Secondary | ICD-10-CM

## 2019-07-16 NOTE — Telephone Encounter (Signed)
  Community Resource Referral   [facility] 07/16/2019   [facility]  Name: Chelsea Gomez   MRN: [id number]   DOB: [birth year]   AGE: 64 y.o.   GENDER: female   PCP Trinna Post, PA-C.   Pt returned my call and I ordered her free glucose meter through [facility] program for a One Touch Monitor and sample strips, emailed patient the confirmation.  Fabio Bering, could you please place an order for the One Touch Glucose Meter Strips prescription to her [place]?  Thank you, Lane . [email or web address]  [phone]

## 2019-07-16 NOTE — Telephone Encounter (Signed)
Can we call in verbal order for one touch verio test strips to check blood sugars once daily, 100 with 1 refill.

## 2019-07-16 NOTE — Telephone Encounter (Signed)
Email to pt  From: Jill Alexanders [facility])  Sent: Wednesday, Jul 16, 2019 10:29 AM To: [email or web address] Subject: Secure: Glucose meter Order  Good Morning Ms. Salameh, You should be receiving an email with the shipping information for your Glucose Meter from [employer]. There will be 10 sample strips with your free meter and as I stated on the phone your strips are offered at Tier 1 (the least expensive for your [facility] insurance program) I will let Fabio Bering know to put in a prescription for these specific strips.  Please let me know if you need anything further, Thank you,        Pillager . [email or web address]  [phone]

## 2019-07-16 NOTE — Telephone Encounter (Signed)
Spoke w/ Shirlean Mylar the pharmacist @ [facility] and gave the verbal order to fill test strips for patients one touch verio meter. Patient was advised as well that she will be getting more strips from her pharmacy as well.

## 2019-07-16 NOTE — Progress Notes (Signed)
HPI:      [name] is a 64 y.o. G2P1011 who LMP was No LMP recorded. Patient is postmenopausal.  Subjective:   She presents today 1 month after LEEP (endocervical canal) Patient states that she had bleeding off and on for about 4 weeks.  Last 2 weeks light only after wiping.  She says she has not bled in the last 5 days.    Hx: The following portions of the patient's history were reviewed and updated as appropriate:             She  has a past medical history of Diabetes mellitus without complication ([place]), H/O multiple trauma, Hyperlipidemia, Hypertension, Low back pain, Lung collapse, Shoulder pain, and Thyroid disease. She does not have any pertinent problems on file. She  has a past surgical history that includes Thyroid surgery; Bladder surgery; Cesarean section; Tooth extraction (2016); and Colonoscopy with propofol (N/A, 04/20/2017). Her family history includes Aneurysm in her father; Breast cancer in her sister; Diabetes in her mother and sister; Heart attack in her mother; Stroke in her mother. She  reports that she has quit smoking. Her smoking use included cigarettes. She has never used smokeless tobacco. She reports that she does not drink alcohol or use drugs. She has a current medication list which includes the following prescription(s): amlodipine, aspirin, atorvastatin, calcium-vitamin d, carvedilol, cholecalciferol, jardiance, glipizide, metformin, olopatadine, quinapril, and linagliptin. She is allergic to mevacor [lovastatin].       Review of Systems:  Review of Systems  Constitutional: Denied constitutional symptoms, night sweats, recent illness, fatigue, fever, insomnia and weight loss.  Eyes: Denied eye symptoms, eye pain, photophobia, vision change and visual disturbance.  Ears/Nose/Throat/Neck: Denied ear, nose, throat or neck symptoms, hearing loss, nasal discharge, sinus congestion and sore throat.  Cardiovascular: Denied cardiovascular symptoms, arrhythmia,  chest pain/pressure, edema, exercise intolerance, orthopnea and palpitations.  Respiratory: Denied pulmonary symptoms, asthma, pleuritic pain, productive sputum, cough, dyspnea and wheezing.  Gastrointestinal: Denied, gastro-esophageal reflux, melena, nausea and vomiting.  Genitourinary: Denied genitourinary symptoms including symptomatic vaginal discharge, pelvic relaxation issues, and urinary complaints.  Musculoskeletal: Denied musculoskeletal symptoms, stiffness, swelling, muscle weakness and myalgia.  Dermatologic: Denied dermatology symptoms, rash and scar.  Neurologic: Denied neurology symptoms, dizziness, headache, neck pain and syncope.  Psychiatric: Denied psychiatric symptoms, anxiety and depression.  Endocrine: Denied endocrine symptoms including hot flashes and night sweats.   Meds:   Current Outpatient Medications on File Prior to Visit  Medication Sig Dispense Refill  . amLODipine (NORVASC) 5 MG tablet Take 1 tablet (5 mg total) by mouth daily. 90 tablet 1  . aspirin 81 MG tablet Take 81 mg by mouth daily.    Marland Kitchen atorvastatin (LIPITOR) 20 MG tablet Take 1 tablet by mouth once daily 90 tablet 0  . CALCIUM-VITAMIN D PO Take 800 mg by mouth daily.    . carvedilol (COREG) 6.25 MG tablet Take 1 tablet (6.25 mg total) by mouth 2 (two) times daily. 180 tablet 1  . cholecalciferol (VITAMIN D) 1000 UNITS tablet Take 1,000 Units by mouth daily.    . empagliflozin (JARDIANCE) 25 MG TABS tablet Take 25 mg by mouth daily before breakfast. 90 tablet 1  . glipiZIDE (GLUCOTROL) 10 MG tablet TAKE 1 TABLET BY MOUTH TWICE DAILY 30 MINUTES BEFORE A MEAL 180 tablet 1  . metFORMIN (GLUCOPHAGE) 1000 MG tablet Take 1 tablet (1,000 mg total) by mouth 2 (two) times daily. 180 tablet 1  . olopatadine (PATANOL) 0.1 % ophthalmic solution  INSTILL 1 DROP INTO EACH EYE TWICE DAILY  3  . quinapril (ACCUPRIL) 20 MG tablet TAKE 1 TABLET BY MOUTH AT BEDTIME 90 tablet 0  . linagliptin (TRADJENTA) 5 MG TABS tablet  Take 1 tablet (5 mg total) by mouth daily. 90 tablet 1   No current facility-administered medications on file prior to visit.    Objective:     Vitals:   07/16/19 0950  BP: 125/79  Pulse: 91              Pathology results reviewed in detail with the patient  Assessment:    G2P1011 Patient Active Problem List   Diagnosis Date Noted  . 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 Morbid obesity ([place]) 12/19/2018  . Mild nonproliferative diabetic retinopathy ([place]) 08/07/2018  . Diabetes mellitus without complication ([place]) [id number]  . GERD (gastroesophageal reflux disease) 08/14/2014  . Hyperlipidemia   . Hypertension   . Lumbar radiculitis 06/19/2012     1. CIN I (cervical intraepithelial neoplasia I)     After inadequate colposcopy.   Plan:            1.  I discussed CIN-1 in detail with the patient.  Necessity of LEEP for inadequate colposcopy discussed.  Recommend first follow-up Pap after LEEP at 4 weeks.  Discussed this with the patient and her sister via telephone.  All questions answered.  Natural course and history of HPV and recovery after LEEP and its relationship to CIN and cervical cancer discussed in detail. Orders No orders of the defined types were placed in this encounter.   No orders of the defined types were placed in this encounter.     F/U  Return in about 4 months (around 11/16/2019). I spent 22 minutes involved in the care of this patient preparing to see the patient by obtaining and reviewing her medical history (including labs, imaging tests and prior procedures), documenting clinical information in the electronic health record (EHR), counseling and coordinating care plans, writing and sending prescriptions, ordering tests or procedures and directly communicating with the patient by discussing pertinent items from her history and physical exam as  well as detailing my assessment and plan as noted above so that she has an informed understanding.  All of her questions were answered.  Finis Bud, M.D. 07/16/2019 10:12 AM

## 2019-07-16 NOTE — Telephone Encounter (Signed)
°  Community Resource Referral   [facility] 07/16/2019   [facility]   Name: Chelsea Gomez    MRN: [id number]    DOB: 1955-03-21    AGE: 64 y.o.    GENDER: female    PCP Trinna Post, PA-C.   Called pt regarding [employer] Referral for [employer]. Pt was at another Dr. appt and will call me back [facility] representative provided the Order Code [id number] [email or web address] Tier 1 for lancet (testing strips)  [place] Management [email or web address]   [id number]

## 2019-07-17 ENCOUNTER — Ambulatory Visit: Payer: Self-pay

## 2019-07-17 DIAGNOSIS — E113299 Type 2 diabetes mellitus with mild nonproliferative diabetic retinopathy without macular edema, unspecified eye: Secondary | ICD-10-CM

## 2019-07-18 NOTE — Patient Instructions (Signed)
Thank you for allowing the Chronic Care Management team to participate in your care.   Goals Addressed            This Visit's Progress   . Improved Diabetes Management       Current Barriers:  . Care Coordination needs related to Diabetes self management.  Case Manager Clinical Goal(s):  Marland Kitchen Over the next 90 days, patient will monitor blood glucose levels daily and maintain a daily log. . Over the next 90 days, patient will take all medications as prescribed. . Over the next 90 days, patient will attend all medical appointments as scheduled. . Over the next 90 days, patient will improve adherence with recommended cardiac prudent/diabetic diet. . Over the next 90 days, patient will increase activity level and engage in mild/low impact exercises.   Interventions:  . Discussed plan for glucose monitoring. Encouraged to monitor fasting blood glucose levels daily and maintain a daily log. She confirmed outreach from the Care Guide regarding glucometer.Will follow-up after glucometer is received to review readings. . Discussed plan for ongoing care management and follow up.  Anticipates receiving glucometer and needed supplies. Agreeable to outreach within the next few weeks to review blood glucose readings.     Patient Self Care Activities:  . Self administers medications  . Attends scheduled provider appointments . Calls pharmacy for medication refills . Attends church or other social activities . Performs ADL's independently . Performs IADL's independently . Calls provider office for new concerns or questions   Please see past updates related to this goal by clicking on the "Past Updates" button in the selected goal         Chelsea Gomez verbalized understanding of the instructions provided during the telephonic outreach today. Declined need for a printed/copy of the instructions.   The care management team will follow-up with Chelsea Gomez within the next two to three  weeks.   Horris Latino [facility] Care Management [phone]

## 2019-07-18 NOTE — Chronic Care Management (AMB) (Signed)
Care Management   Follow Up Note   07/18/2019 Name: Chelsea Gomez MRN: [id number] DOB: 11-15-1955  Primary Care Provider: Trinna Post, PA-C Reason for referral : Care Management   Chelsea Gomez is a 64 y.o. year old female who is a primary care patient of Trinna Post, [place]. She is currently engaged with the chronic care management team. A brief telephonic outreach was conducted today.   Review of Chelsea Gomez's status, including review of consultants reports, relevant labs and test results was conducted today. Collaboration with appropriate care team members was performed as part of the comprehensive evaluation and provision of chronic care management services.    SDOH (Social Determinants of Health) assessments performed: No     Current Outpatient Medications on File Prior to Visit  Medication Sig Dispense Refill  . amLODipine (NORVASC) 5 MG tablet Take 1 tablet (5 mg total) by mouth daily. 90 tablet 1  . aspirin 81 MG tablet Take 81 mg by mouth daily.    Marland Kitchen atorvastatin (LIPITOR) 20 MG tablet Take 1 tablet by mouth once daily 90 tablet 0  . CALCIUM-VITAMIN D PO Take 800 mg by mouth daily.    . carvedilol (COREG) 6.25 MG tablet Take 1 tablet (6.25 mg total) by mouth 2 (two) times daily. 180 tablet 1  . cholecalciferol (VITAMIN D) 1000 UNITS tablet Take 1,000 Units by mouth daily.    . empagliflozin (JARDIANCE) 25 MG TABS tablet Take 25 mg by mouth daily before breakfast. 90 tablet 1  . glipiZIDE (GLUCOTROL) 10 MG tablet TAKE 1 TABLET BY MOUTH TWICE DAILY 30 MINUTES BEFORE A MEAL 180 tablet 1  . linagliptin (TRADJENTA) 5 MG TABS tablet Take 1 tablet (5 mg total) by mouth daily. 90 tablet 1  . metFORMIN (GLUCOPHAGE) 1000 MG tablet Take 1 tablet (1,000 mg total) by mouth 2 (two) times daily. 180 tablet 1  . olopatadine (PATANOL) 0.1 % ophthalmic solution INSTILL 1 DROP INTO EACH EYE TWICE DAILY  3  . quinapril (ACCUPRIL) 20 MG tablet TAKE 1 TABLET BY MOUTH AT BEDTIME 90  tablet 0   No current facility-administered medications on file prior to visit.     Goals Addressed            This Visit's Progress   . Improved Diabetes Management       Current Barriers:  . Care Coordination needs related to Diabetes self management.  Case Manager Clinical Goal(s):  Marland Kitchen Over the next 90 days, patient will monitor blood glucose levels daily and maintain a daily log. . Over the next 90 days, patient will take all medications as prescribed. . Over the next 90 days, patient will attend all medical appointments as scheduled. . Over the next 90 days, patient will improve adherence with recommended cardiac prudent/diabetic diet. . Over the next 90 days, patient will increase activity level and engage in mild/low impact exercises.   Interventions:  . Discussed plan for glucose monitoring. Encouraged to monitor fasting blood glucose levels daily and maintain a daily log. She confirmed outreach from the Care Guide regarding glucometer.Will follow-up after glucometer is received to review readings. . Discussed plan for ongoing care management and follow up.  Anticipates receiving glucometer and needed supplies. Agreeable to outreach within the next few weeks to review blood glucose readings.     Patient Self Care Activities:  . Self administers medications  . Attends scheduled provider appointments . Calls pharmacy for medication refills . Attends church or other social  activities . Performs ADL's independently . Performs IADL's independently . Calls provider office for new concerns or questions   Please see past updates related to this goal by clicking on the "Past Updates" button in the selected goal          PLAN The care management team will follow-up with Chelsea Gomez within the next two to three weeks.   Chelsea Gomez [facility] Practice/[facility] Care Management [phone]   SIGNATURE

## 2019-07-22 ENCOUNTER — Ambulatory Visit: Payer: Self-pay

## 2019-08-07 ENCOUNTER — Ambulatory Visit: Payer: Self-pay

## 2019-08-07 DIAGNOSIS — E113299 Type 2 diabetes mellitus with mild nonproliferative diabetic retinopathy without macular edema, unspecified eye: Secondary | ICD-10-CM

## 2019-08-07 NOTE — Chronic Care Management (AMB) (Signed)
This encounter was created in error. Please disregard. 

## 2019-08-09 ENCOUNTER — Other Ambulatory Visit: Payer: Self-pay | Admitting: Physician Assistant

## 2019-08-09 DIAGNOSIS — I10 Essential (primary) hypertension: Secondary | ICD-10-CM

## 2019-08-09 NOTE — Telephone Encounter (Signed)
Requested Prescriptions  Pending Prescriptions Disp Refills  . quinapril (ACCUPRIL) 20 MG tablet [Pharmacy Med Name: Quinapril HCl 20 MG Oral Tablet] 90 tablet 0    Sig: TAKE 1 TABLET BY MOUTH AT BEDTIME     Cardiovascular:  ACE Inhibitors Failed - 08/09/2019 12:30 PM      Failed - Cr in normal range and within 180 days    Creatinine  Date Value Ref Range Status  12/16/2011 0.61 0.60 - 1.30 mg/dL Final   Creatinine, Ser  Date Value Ref Range Status  09/18/2018 0.49 (L) 0.57 - 1.00 mg/dL Final         Failed - K in normal range and within 180 days    Potassium  Date Value Ref Range Status  09/18/2018 4.1 3.5 - 5.2 mmol/L Final  12/16/2011 3.5 3.5 - 5.1 mmol/L Final         Passed - Patient is not pregnant      Passed - Last BP in normal range    BP Readings from Last 1 Encounters:  07/16/19 125/79         Passed - Valid encounter within last 6 months    Recent Outpatient Visits          3 months ago Diabetes mellitus without complication [facility])   [place], Wendee Beavers, PA-C   7 months ago Essential hypertension   [facility], [place], [place]   10 months ago Annual physical exam   [facility] Carles Collet M, [place]   1 year ago Diabetes mellitus without complication [facility])   [facility] [place] [facility] Trinna Post, PA-C   1 year ago Diabetes mellitus without complication [facility])   [facility] [place], Herbie [place], [place]      Future Appointments            In 4 days Trinna Post, PA-C [employer], [facility]

## 2019-08-13 ENCOUNTER — Other Ambulatory Visit: Payer: Self-pay

## 2019-08-13 ENCOUNTER — Ambulatory Visit (INDEPENDENT_AMBULATORY_CARE_PROVIDER_SITE_OTHER): Payer: Medicare Other | Admitting: Physician Assistant

## 2019-08-13 ENCOUNTER — Encounter: Payer: Self-pay | Admitting: Physician Assistant

## 2019-08-13 VITALS — BP 118/78 | HR 97 | Temp 96.9°F | Wt 234.4 lb

## 2019-08-13 DIAGNOSIS — E1169 Type 2 diabetes mellitus with other specified complication: Secondary | ICD-10-CM | POA: Diagnosis not present

## 2019-08-13 DIAGNOSIS — E113299 Type 2 diabetes mellitus with mild nonproliferative diabetic retinopathy without macular edema, unspecified eye: Secondary | ICD-10-CM | POA: Diagnosis not present

## 2019-08-13 DIAGNOSIS — E785 Hyperlipidemia, unspecified: Secondary | ICD-10-CM

## 2019-08-13 DIAGNOSIS — I10 Essential (primary) hypertension: Secondary | ICD-10-CM

## 2019-08-13 DIAGNOSIS — E782 Mixed hyperlipidemia: Secondary | ICD-10-CM | POA: Diagnosis not present

## 2019-08-13 DIAGNOSIS — E119 Type 2 diabetes mellitus without complications: Secondary | ICD-10-CM

## 2019-08-13 LAB — POCT GLYCOSYLATED HEMOGLOBIN (HGB A1C)
Est. average glucose Bld gHb Est-mCnc: 163
Hemoglobin A1C: 7.3 % — AB (ref 4.0–5.6)

## 2019-08-13 MED ORDER — ATORVASTATIN CALCIUM 20 MG PO TABS: 20.0000 mg | ORAL_TABLET | Freq: Every day | ORAL | 0 refills | Status: DC

## 2019-08-13 MED ORDER — EMPAGLIFLOZIN 25 MG PO TABS: 25.0000 mg | ORAL_TABLET | Freq: Every day | ORAL | 1 refills | Status: DC

## 2019-08-13 MED ORDER — GLIPIZIDE 10 MG PO TABS: ORAL_TABLET | ORAL | 1 refills | Status: DC

## 2019-08-13 MED ORDER — LINAGLIPTIN 5 MG PO TABS: 5.0000 mg | ORAL_TABLET | Freq: Every day | ORAL | 1 refills | Status: DC

## 2019-08-13 MED ORDER — METFORMIN HCL 1000 MG PO TABS: 1000.0000 mg | ORAL_TABLET | Freq: Two times a day (BID) | ORAL | 1 refills | Status: DC

## 2019-08-13 MED ORDER — AMLODIPINE BESYLATE 5 MG PO TABS: 5.0000 mg | ORAL_TABLET | Freq: Every day | ORAL | 1 refills | Status: DC

## 2019-08-13 NOTE — Progress Notes (Signed)
Established patient visit   Patient: Chelsea Gomez   DOB: 31-Aug-1955   64 y.o. Female  MRN: [id number] Visit Date: 08/13/2019  Today's healthcare provider: Trinna Post, PA-C   Chief Complaint  Patient presents with  . Diabetes  . Hypertension  . Hyperlipidemia  I,Mabelle Mungin M Ayaana Biondo,acting as a scribe for [employer], PA-C.,have documented all relevant documentation on the behalf of Trinna Post, PA-C,as directed by  Trinna Post, PA-C while in the presence of Trinna Post, PA-C.  Subjective    HPI Diabetes Mellitus Type II, Follow-up  Lab Results  Component Value Date   HGBA1C 7.3 (A) 08/13/2019   HGBA1C 7.9 (A) 04/23/2019   HGBA1C 7.7 (A) 12/19/2018   Wt Readings from Last 3 Encounters:  08/13/19 234 lb 6.4 oz (106.3 kg)  07/16/19 232 lb 6.4 oz (105.4 kg)  06/13/19 232 lb (105.2 kg)   Last seen for diabetes 4 months ago.  Management since then includes continue meds. She reports good compliance with treatment. She is not having side effects.  Symptoms: No fatigue No foot ulcerations  No appetite changes No nausea  No paresthesia of the feet  No polydipsia  No polyuria No visual disturbances   No vomiting     Home blood sugar records: only checking in evenings 140's-170's  Episodes of hypoglycemia? No    Current insulin regiment: none Most Recent Eye Exam: UTD Current exercise: housecleaning and no regular exercise Current diet habits: well balanced  Pertinent Labs: Lab Results  Component Value Date   CHOL 119 09/18/2018   HDL 37 (L) 09/18/2018   LDLCALC 62 09/18/2018   TRIG 102 09/18/2018   CHOLHDL 3.2 09/18/2018   Lab Results  Component Value Date   NA 146 (H) 09/18/2018   K 4.1 09/18/2018   CREATININE 0.49 (L) 09/18/2018   GFRNONAA 103 09/18/2018   GFRAA 119 09/18/2018   GLUCOSE 124 (H) 09/18/2018     Hypertension, follow-up  BP Readings from Last 3 Encounters:  08/13/19 118/78  07/16/19 125/79  06/13/19 119/68     Wt Readings from Last 3 Encounters:  08/13/19 234 lb 6.4 oz (106.3 kg)  07/16/19 232 lb 6.4 oz (105.4 kg)  06/13/19 232 lb (105.2 kg)     She was last seen for hypertension 4 months ago.  BP at that visit was normal. Management since that visit includes continue medicatios.  She reports good compliance with treatment. She is not having side effects.  She is following a Regular, . diet. She is not exercising. She does not smoke.  Use of agents associated with hypertension: none.   Outside blood pressures are not checked. Symptoms: No chest pain No chest pressure  No palpitations No syncope  No dyspnea No orthopnea  No paroxysmal nocturnal dyspnea No lower extremity edema   Pertinent labs: Lab Results  Component Value Date   CHOL 119 09/18/2018   HDL 37 (L) 09/18/2018   LDLCALC 62 09/18/2018   TRIG 102 09/18/2018   CHOLHDL 3.2 09/18/2018   Lab Results  Component Value Date   NA 146 (H) 09/18/2018   K 4.1 09/18/2018   CREATININE 0.49 (L) 09/18/2018   GFRNONAA 103 09/18/2018   GFRAA 119 09/18/2018   GLUCOSE 124 (H) 09/18/2018     The ASCVD Risk score (Goff DC Jr., et al., 2013) failed to calculate for the following reasons:   The valid total cholesterol range is 130 to 320 mg/dL  Lipid/Cholesterol, Follow-up  Last lipid panel Other pertinent labs  Lab Results  Component Value Date   CHOL 119 09/18/2018   HDL 37 (L) 09/18/2018   LDLCALC 62 09/18/2018   TRIG 102 09/18/2018   CHOLHDL 3.2 09/18/2018   Lab Results  Component Value Date   ALT 12 09/18/2018   AST 14 09/18/2018   PLT 334 09/18/2018   TSH 1.910 09/18/2018     She was last seen for this 4 months ago.  Management since that visit includes continue statin.  She reports good compliance with treatment. She is not having side effects.   Symptoms: No chest pain No chest pressure/discomfort  No dyspnea No lower extremity edema  No numbness or tingling of extremity No orthopnea  No palpitations  No paroxysmal nocturnal dyspnea  No speech difficulty No syncope   Current diet: in general, an "unhealthy" diet Current exercise: none  The ASCVD Risk score ([place]., et al., 2013) failed to calculate for the following reasons:   The valid total cholesterol range is 130 to 320 mg/dL  ---------------------------------------------------------------------------------------------------  --------------------------------------------------------------------------------------------------- ---------------------------------------------------------------------------------------------------      Medications: Outpatient Medications Prior to Visit  Medication Sig  . aspirin 81 MG tablet Take 81 mg by mouth daily.  Marland Kitchen CALCIUM-VITAMIN D PO Take 800 mg by mouth daily.  . carvedilol (COREG) 6.25 MG tablet Take 1 tablet (6.25 mg total) by mouth 2 (two) times daily.  . cholecalciferol (VITAMIN D) 1000 UNITS tablet Take 1,000 Units by mouth daily.  Marland Kitchen olopatadine (PATANOL) 0.1 % ophthalmic solution INSTILL 1 DROP INTO EACH EYE TWICE DAILY  . quinapril (ACCUPRIL) 20 MG tablet TAKE 1 TABLET BY MOUTH AT BEDTIME  . [DISCONTINUED] amLODipine (NORVASC) 5 MG tablet Take 1 tablet (5 mg total) by mouth daily.  . [DISCONTINUED] atorvastatin (LIPITOR) 20 MG tablet Take 1 tablet by mouth once daily  . [DISCONTINUED] empagliflozin (JARDIANCE) 25 MG TABS tablet Take 25 mg by mouth daily before breakfast.  . [DISCONTINUED] glipiZIDE (GLUCOTROL) 10 MG tablet TAKE 1 TABLET BY MOUTH TWICE DAILY 30 MINUTES BEFORE A MEAL  . [DISCONTINUED] linagliptin (TRADJENTA) 5 MG TABS tablet Take 1 tablet (5 mg total) by mouth daily.  . [DISCONTINUED] metFORMIN (GLUCOPHAGE) 1000 MG tablet Take 1 tablet (1,000 mg total) by mouth 2 (two) times daily.   No facility-administered medications prior to visit.    Review of Systems  Constitutional: Negative.   Respiratory: Negative.   Cardiovascular: Negative.   Hematological:  Negative.       Objective    BP 118/78 (BP Location: Left Arm, Patient Position: Sitting, Cuff Size: Normal)   Pulse 97   Temp (!) 96.9 F (36.1 C) (Temporal)   Wt 234 lb 6.4 oz (106.3 kg)   SpO2 96%   BMI 42.87 kg/m    Physical Exam Constitutional:      Appearance: Normal appearance.  Cardiovascular:     Rate and Rhythm: Normal rate and regular rhythm.     Heart sounds: Normal heart sounds.  Pulmonary:     Effort: Pulmonary effort is normal.     Breath sounds: Normal breath sounds.  Musculoskeletal:     Right foot: Normal.     Left foot: Normal.  Skin:    General: Skin is warm and dry.  Neurological:     Mental Status: She is alert and oriented to person, place, and time. Mental status is at baseline.  Psychiatric:        Mood and Affect: Mood  normal.        Behavior: Behavior normal.       Results for orders placed or performed in visit on 08/13/19  POCT glycosylated hemoglobin (Hb A1C)  Result Value Ref Range   Hemoglobin A1C 7.3 (A) 4.0 - 5.6 %   HbA1c POC (<> result, manual entry)     HbA1c, POC (prediabetic range)     HbA1c, POC (controlled diabetic range)     Est. average glucose Bld gHb Est-mCnc 163     Assessment & Plan    1. Type 2 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, without long-term current use of insulin, unspecified laterality ([place]) Well controlled with last A1c 7.3 Continue current medications UTD on vaccines, eye exam, foot exam On ACEi On Statin Discussed diet and exercise  - POCT glycosylated hemoglobin (Hb A1C) - Comprehensive metabolic panel - CBC with Differential/Platelet - empagliflozin (JARDIANCE) 25 MG TABS tablet; Take 1 tablet (25 mg total) by mouth daily before breakfast.  Dispense: 90 tablet; Refill: 1 - linagliptin (TRADJENTA) 5 MG TABS tablet; Take 1 tablet (5 mg total) by mouth daily.  Dispense: 90 tablet; Refill: 1 - glipiZIDE (GLUCOTROL) 10 MG tablet; TAKE 1 TABLET BY MOUTH  TWICE DAILY 30 MINUTES BEFORE A MEAL  Dispense: 180 tablet; Refill: 1 - metFORMIN (GLUCOPHAGE) 1000 MG tablet; Take 1 tablet (1,000 mg total) by mouth 2 (two) times daily.  Dispense: 180 tablet; Refill: 1  2. Essential hypertension Well controlled Continue current medications Recheck metabolic panel  - TSH - Comprehensive metabolic panel - CBC with Differential/Platelet - amLODipine (NORVASC) 5 MG tablet; Take 1 tablet (5 mg total) by mouth daily.  Dispense: 90 tablet; Refill: 1  3. Hyperlipidemia associated with type 2 diabetes mellitus ([place]) Previously well controlled Continue statin Repeat FLP and CMP Goal LDL < 70  - Lipid panel - atorvastatin (LIPITOR) 20 MG tablet; Take 1 tablet (20 mg total) by mouth daily.  Dispense: 90 tablet; Refill: 0      Return in about 4 months (around 12/13/2019) for HLD, HTN, DM.      I, Trinna Post, PA-C, have reviewed all documentation for this visit. The documentation on 08/13/19 for the exam, diagnosis, procedures, and orders are all accurate and complete.    Paulene Floor  [facility] [phone] (phone) [phone] (fax)  [place]

## 2019-08-13 NOTE — Patient Instructions (Signed)

## 2019-08-14 LAB — CBC WITH DIFFERENTIAL/PLATELET
Basophils Absolute: 0.1 10*3/uL (ref 0.0–0.2)
Basos: 1 %
EOS (ABSOLUTE): 0.2 10*3/uL (ref 0.0–0.4)
Eos: 3 %
Hematocrit: 41.6 % (ref 34.0–46.6)
Hemoglobin: 13.8 g/dL (ref 11.1–15.9)
Immature Grans (Abs): 0 10*3/uL (ref 0.0–0.1)
Immature Granulocytes: 0 %
Lymphocytes Absolute: 3.3 10*3/uL — ABNORMAL HIGH (ref 0.7–3.1)
Lymphs: 44 %
MCH: 28.7 pg (ref 26.6–33.0)
MCHC: 33.2 g/dL (ref 31.5–35.7)
MCV: 87 fL (ref 79–97)
Monocytes Absolute: 0.6 10*3/uL (ref 0.1–0.9)
Monocytes: 8 %
Neutrophils Absolute: 3.3 10*3/uL (ref 1.4–7.0)
Neutrophils: 44 %
Platelets: 303 10*3/uL (ref 150–450)
RBC: 4.81 x10E6/uL (ref 3.77–5.28)
RDW: 11.8 % (ref 11.7–15.4)
WBC: 7.4 10*3/uL (ref 3.4–10.8)

## 2019-08-14 LAB — COMPREHENSIVE METABOLIC PANEL
ALT: 14 IU/L (ref 0–32)
AST: 17 IU/L (ref 0–40)
Albumin/Globulin Ratio: 1.5 (ref 1.2–2.2)
Albumin: 4.4 g/dL (ref 3.8–4.8)
Alkaline Phosphatase: 98 IU/L (ref 48–121)
BUN/Creatinine Ratio: 22 (ref 12–28)
BUN: 11 mg/dL (ref 8–27)
Bilirubin Total: 0.3 mg/dL (ref 0.0–1.2)
CO2: 20 mmol/L (ref 20–29)
Calcium: 9.2 mg/dL (ref 8.7–10.3)
Chloride: 101 mmol/L (ref 96–106)
Creatinine, Ser: 0.5 mg/dL — ABNORMAL LOW (ref 0.57–1.00)
GFR calc Af Amer: 118 mL/min/{1.73_m2} (ref >59)
GFR calc non Af Amer: 103 mL/min/{1.73_m2} (ref >59)
Globulin, Total: 3 g/dL (ref 1.5–4.5)
Glucose: 103 mg/dL — ABNORMAL HIGH (ref 65–99)
Potassium: 3.9 mmol/L (ref 3.5–5.2)
Sodium: 142 mmol/L (ref 134–144)
Total Protein: 7.4 g/dL (ref 6.0–8.5)

## 2019-08-14 LAB — LIPID PANEL
Chol/HDL Ratio: 3.5 ratio (ref 0.0–4.4)
Cholesterol, Total: 125 mg/dL (ref 100–199)
HDL: 36 mg/dL — ABNORMAL LOW (ref >39)
LDL Chol Calc (NIH): 64 mg/dL (ref 0–99)
Triglycerides: 141 mg/dL (ref 0–149)
VLDL Cholesterol Cal: 25 mg/dL (ref 5–40)

## 2019-08-14 LAB — TSH: TSH: 1.36 u[IU]/mL (ref 0.450–4.500)

## 2019-08-18 NOTE — Patient Instructions (Signed)
Thank you for allowing the Chronic Care Management team to participate in your care.  Goals Addressed            This Visit's Progress   . COMPLETED: Improved Diabetes Management       Current Barriers:  . Care Coordination needs related to Diabetes self management.  Case Manager Clinical Goal(s):  Marland Kitchen Over the next 90 days, patient will monitor blood glucose levels daily and maintain a daily log. . Over the next 90 days, patient will take all medications as prescribed. . Over the next 90 days, patient will attend all medical appointments as scheduled. . Over the next 90 days, patient will improve adherence with recommended cardiac prudent/diabetic diet. . Over the next 90 days, patient will increase activity level and engage in mild/low impact exercises.   Interventions:  . Discussed plan for glucose monitoring. Encouraged to monitor fasting blood glucose levels daily and maintain a daily log. Confirmed receipt of glucometer. Reports most readings have been within range. Discussed s/sx of hypoglycemia and hyperglycemia along with recommended interventions. . Discussed plan for ongoing care management and follow up.  Will mail additional diabetic education documents along with calendar/log to assist with tracking daily readings.    Patient Self Care Activities:  . Self administers medications  . Attends scheduled provider appointments . Calls pharmacy for medication refills . Attends church or other social activities . Performs ADL's independently . Performs IADL's independently . Calls provider office for new concerns or questions   Please see past updates related to this goal by clicking on the "Past Updates" button in the selected goal         Chelsea Gomez verbalized understanding of the instructions provided during the brief telephonic outreach today. Declined need for a printed/mailed copy of the instructions. Will mail additional  resources along with a calendar to assist  with tracking/logging blood glucose readings.   The care management team will follow-up with Chelsea Gomez to review readings within the next two months.   Horris Latino [facility] Care Management [phone]

## 2019-08-18 NOTE — Chronic Care Management (AMB) (Signed)
  Care Management   Follow Up Note    Name: Chelsea Gomez MRN: [id number] DOB: [birth year]  Primary Care Provider: Trinna Post, PA-C Reason for referral : Chronic Care Management   Chelsea Gomez is a 63 y.o. year old female who is a primary care patient of Trinna Post, [place]. She is currently engaged with the care management team. A brief telephonic outreach was conducted today.   Review of Chelsea Gomez's status, including review of consultants reports, relevant labs and test results was conducted today. Collaboration with appropriate care team members was performed as part of the comprehensive evaluation and provision of chronic care management services.    SDOH (Social Determinants of Health) assessments performed: No    Goals Addressed            This Visit's Progress   . COMPLETED: Improved Diabetes Management       Current Barriers:  . Care Coordination needs related to Diabetes self management.  Case Manager Clinical Goal(s):  Marland Kitchen Over the next 90 days, patient will monitor blood glucose levels daily and maintain a daily log. . Over the next 90 days, patient will take all medications as prescribed. . Over the next 90 days, patient will attend all medical appointments as scheduled. . Over the next 90 days, patient will improve adherence with recommended cardiac prudent/diabetic diet. . Over the next 90 days, patient will increase activity level and engage in mild/low impact exercises.   Interventions:  . Discussed plan for glucose monitoring. Encouraged to monitor fasting blood glucose levels daily and maintain a daily log. Confirmed receipt of glucometer. Reports most readings have been within range. Discussed s/sx of hypoglycemia and hyperglycemia along with recommended interventions. . Discussed plan for ongoing care management and follow up.  Will mail additional diabetic education documents along with calendar/log to assist with tracking daily  readings.    Patient Self Care Activities:  . Self administers medications  . Attends scheduled provider appointments . Calls pharmacy for medication refills . Attends church or other social activities . Performs ADL's independently . Performs IADL's independently . Calls provider office for new concerns or questions   Please see past updates related to this goal by clicking on the "Past Updates" button in the selected goal         PLAN The care management team will reach out again within the next two months.    Horris Latino [facility] Care Management [phone]

## 2019-08-21 ENCOUNTER — Ambulatory Visit: Payer: Self-pay

## 2019-08-21 ENCOUNTER — Ambulatory Visit: Payer: Self-pay | Admitting: Physician Assistant

## 2019-08-21 NOTE — Chronic Care Management (AMB) (Signed)
  Chronic Care Management   Note  08/21/2019 Name: Chelsea Gomez MRN: [id number] DOB: 04-04-1955   Care Coordination: Educational material mailed.    Follow up plan: The care management team will follow-up with [name] as scheduled on 10/14/19.    Horris Latino [facility] Care Management [phone]

## 2019-08-28 LAB — HM DIABETES EYE EXAM

## 2019-10-14 ENCOUNTER — Telehealth: Payer: Self-pay

## 2019-10-14 NOTE — Telephone Encounter (Signed)
   Care Management   Outreach Note  10/14/2019 Name: Chelsea Gomez MRN: [id number] DOB: 04/11/1955  Primary Care Provider: Paulene Floor Reason for referral : Care Management   An unsuccessful telephone outreach was attempted today. [name] is engaged with the care management team.  A HIPAA compliant voice message was left today requesting a return call.    Follow Up Plan: A member of the care management team will reach out to [name] again within the next two weeks.   Horris Latino [facility] Care Management [phone]

## 2019-10-16 ENCOUNTER — Ambulatory Visit (INDEPENDENT_AMBULATORY_CARE_PROVIDER_SITE_OTHER): Payer: Medicare Other | Admitting: Physician Assistant

## 2019-10-16 ENCOUNTER — Encounter: Payer: Self-pay | Admitting: Physician Assistant

## 2019-10-16 ENCOUNTER — Ambulatory Visit
Admission: RE | Admit: 2019-10-16 | Discharge: 2019-10-16 | Disposition: A | Discharge status: Home / Self Care | Payer: Medicare Other | Attending: Physician Assistant | Admitting: Physician Assistant

## 2019-10-16 ENCOUNTER — Other Ambulatory Visit: Payer: Self-pay

## 2019-10-16 ENCOUNTER — Ambulatory Visit
Admission: RE | Admit: 2019-10-16 | Discharge: 2019-10-16 | Disposition: A | Discharge status: Home / Self Care | Payer: Medicare Other | Source: Ambulatory Visit | Attending: Physician Assistant | Admitting: Physician Assistant

## 2019-10-16 VITALS — BP 136/76 | HR 114 | Temp 98.8°F | Wt 228.0 lb

## 2019-10-16 DIAGNOSIS — M25562 Pain in left knee: Secondary | ICD-10-CM | POA: Diagnosis not present

## 2019-10-16 MED ORDER — MELOXICAM 7.5 MG PO TABS: 7.5000 mg | ORAL_TABLET | Freq: Every day | ORAL | 0 refills | Status: DC

## 2019-10-16 NOTE — Progress Notes (Signed)
Established patient visit   Patient: Chelsea Gomez   DOB: 16-Jul-1955   64 y.o. Female  MRN: [id number] Visit Date: 10/16/2019  Today's healthcare provider: Trinna Post, PA-C   Chief Complaint  Patient presents with  . Knee Pain  I,Chayil Gantt M Soley Harriss,acting as a scribe for Trinna Post, PA-C.,have documented all relevant documentation on the behalf of Trinna Post, PA-C,as directed by  Trinna Post, PA-C while in the presence of Trinna Post, PA-C.  Subjective    Knee Pain  The incident occurred more than 1 week ago. There was no injury mechanism. The pain is present in the left knee and left leg. The quality of the pain is described as shooting and stabbing. The pain is at a severity of 7/10. The pain is severe. The pain has been constant since onset. Associated symptoms include an inability to bear weight. Pertinent negatives include no loss of motion, loss of sensation, numbness or tingling. She reports no foreign bodies present. The symptoms are aggravated by movement and weight bearing. She has tried ice, heat, acetaminophen and non-weight bearing for the symptoms. The treatment provided mild relief.    Pain has been present total for one month but has worsening over the past week. She reports being unable to sleep last few nights.      Medications: Outpatient Medications Prior to Visit  Medication Sig  . amLODipine (NORVASC) 5 MG tablet Take 1 tablet (5 mg total) by mouth daily.  Marland Kitchen aspirin 81 MG tablet Take 81 mg by mouth daily.  Marland Kitchen atorvastatin (LIPITOR) 20 MG tablet Take 1 tablet (20 mg total) by mouth daily.  Marland Kitchen CALCIUM-VITAMIN D PO Take 800 mg by mouth daily.  . carvedilol (COREG) 6.25 MG tablet Take 1 tablet (6.25 mg total) by mouth 2 (two) times daily.  . cholecalciferol (VITAMIN D) 1000 UNITS tablet Take 1,000 Units by mouth daily.  . empagliflozin (JARDIANCE) 25 MG TABS tablet Take 1 tablet (25 mg total) by mouth daily before breakfast.  .  glipiZIDE (GLUCOTROL) 10 MG tablet TAKE 1 TABLET BY MOUTH TWICE DAILY 30 MINUTES BEFORE A MEAL  . linagliptin (TRADJENTA) 5 MG TABS tablet Take 1 tablet (5 mg total) by mouth daily.  . metFORMIN (GLUCOPHAGE) 1000 MG tablet Take 1 tablet (1,000 mg total) by mouth 2 (two) times daily.  Marland Kitchen olopatadine (PATANOL) 0.1 % ophthalmic solution INSTILL 1 DROP INTO EACH EYE TWICE DAILY  . quinapril (ACCUPRIL) 20 MG tablet TAKE 1 TABLET BY MOUTH AT BEDTIME   No facility-administered medications prior to visit.    Review of Systems  Constitutional: Negative.   Cardiovascular: Negative for leg swelling.  Musculoskeletal: Positive for arthralgias.  Skin: Negative.   Neurological: Negative for tingling, weakness and numbness.      Objective    BP 136/76 (BP Location: Right Arm, Patient Position: Sitting, Cuff Size: Normal)   Pulse (!) 114   Temp 98.8 F (37.1 C) (Oral)   Wt 228 lb (103.4 kg)   SpO2 96%   BMI 41.70 kg/m    Physical Exam Constitutional:      Appearance: Normal appearance.  Skin:    General: Skin is warm and dry.  Neurological:     General: No focal deficit present.     Mental Status: She is alert and oriented to person, place, and time.  Psychiatric:        Mood and Affect: Mood normal.  Behavior: Behavior normal.       No results found for any visits on 10/16/19.  Assessment & Plan    1. Acute pain of left knee  Likely OA, XRAY showed mild arthritic changes. Can use sparing NSAID. Tylenol will be safest. May consider topical voltaren instead of PO NSAID. If worsening, will consider steroid injection.   - meloxicam (MOBIC) 7.5 MG tablet; Take 1 tablet (7.5 mg total) by mouth daily.  Dispense: 30 tablet; Refill: 0 - DG Knee Complete 4 Views Left; Future    Return if symptoms worsen or fail to improve.      ITrinna Post, PA-C, have reviewed all documentation for this visit. The documentation on 10/22/19 for the exam, diagnosis, procedures, and  orders are all accurate and complete.  The entirety of the information documented in the History of Present Illness, Review of Systems and Physical Exam were personally obtained by me. Portions of this information were initially documented by [facility] and reviewed by me for thoroughness and accuracy.     Paulene Floor  [facility] [phone] (phone) [phone] (fax)  [place]

## 2019-10-16 NOTE — Patient Instructions (Addendum)
Can take tylenol with meloxicam.   Arthritis Arthritis means joint pain. It can also mean joint disease. A joint is a place where bones come together. There are more than 100 types of arthritis. What are the causes? This condition may be caused by:  Wear and tear of a joint. This is the most common cause.  A lot of acid in the blood, which leads to pain in the joint (gout).  Pain and swelling (inflammation) in a joint.  Infection of a joint.  Injuries in the joint.  A reaction to medicines (allergy). In some cases, the cause may not be known. What are the signs or symptoms? Symptoms of this condition include:  Redness at a joint.  Swelling at a joint.  Stiffness at a joint.  Warmth coming from the joint.  A fever.  A feeling of being sick. How is this treated? This condition may be treated with:  Treating the cause, if it is known.  Rest.  Raising (elevating) the joint.  Putting cold or hot packs on the joint.  Medicines to treat symptoms and reduce pain and swelling.  Shots of medicines (cortisone) into the joint. You may also be told to make changes in your life, such as doing exercises and losing weight. Follow these instructions at home: Medicines  Take over-the-counter and prescription medicines only as told by your doctor.  Do not take aspirin for pain if your doctor says that you may have gout. Activity  Rest your joint if your doctor tells you to.  Avoid activities that make the pain worse.  Exercise your joint regularly as told by your doctor. Try doing exercises like: ? Swimming. ? Water aerobics. ? Biking. ? Walking. Managing pain, stiffness, and swelling      If told, put ice on the affected area. ? Put ice in a plastic bag. ? Place a towel between your skin and the bag. ? Leave the ice on for 20 minutes, 2-3 times per day.  If your joint is swollen, raise (elevate) it above the level of your heart if told by your doctor.  If  your joint feels stiff in the morning, try taking a warm shower.  If told, put heat on the affected area. Do this as often as told by your doctor. Use the heat source that your doctor recommends, such as a moist heat pack or a heating pad. If you have diabetes, do not apply heat without asking your doctor. To apply heat: ? Place a towel between your skin and the heat source. ? Leave the heat on for 20-30 minutes. ? Remove the heat if your skin turns bright red. This is very important if you are unable to feel pain, heat, or cold. You may have a greater risk of getting burned. General instructions  Do not use any products that contain nicotine or tobacco, such as cigarettes, e-cigarettes, and chewing tobacco. If you need help quitting, ask your doctor.  Keep all follow-up visits as told by your doctor. This is important. Contact a doctor if:  The pain gets worse.  You have a fever. Get help right away if:  You have very bad pain in your joint.  You have swelling in your joint.  Your joint is red.  Many joints become painful and swollen.  You have very bad back pain.  Your leg is very weak.  You cannot control your pee (urine) or poop (stool). Summary  Arthritis means joint pain. It can also mean  joint disease. A joint is a place where bones come together.  The most common cause of this condition is wear and tear of a joint.  Symptoms of this condition include redness, swelling, or stiffness of the joint.  This condition is treated with rest, raising the joint, medicines, and putting cold or hot packs on the joint.  Follow your doctor's instructions about medicines, activity, exercises, and other home care treatments. This information is not intended to replace advice given to you by your health care provider. Make sure you discuss any questions you have with your health care provider. Document Revised: 01/28/2018 Document Reviewed: 01/28/2018 Elsevier Patient Education   2020 [employer].

## 2019-10-20 ENCOUNTER — Telehealth: Payer: Self-pay

## 2019-10-20 NOTE — Telephone Encounter (Signed)
-----   Message from Trinna Post, [place] sent at 10/17/2019 12:17 PM EDT ----- No fracture. Mild arthritis. Patient would like to be called with her results.

## 2019-10-20 NOTE — Telephone Encounter (Signed)
Patient was advised and states she will continue to take the Meloxicam due to it helping with the pain. Just FYI

## 2019-11-05 ENCOUNTER — Other Ambulatory Visit: Payer: Self-pay | Admitting: Physician Assistant

## 2019-11-05 DIAGNOSIS — E1169 Type 2 diabetes mellitus with other specified complication: Secondary | ICD-10-CM

## 2019-11-11 ENCOUNTER — Other Ambulatory Visit: Payer: Self-pay | Admitting: Physician Assistant

## 2019-11-11 DIAGNOSIS — I10 Essential (primary) hypertension: Secondary | ICD-10-CM

## 2019-11-13 ENCOUNTER — Telehealth: Payer: Self-pay

## 2019-11-13 NOTE — Telephone Encounter (Signed)
°   Care Management   Outreach Note  11/13/2019 Name: Chelsea Gomez MRN: [id number] DOB: 16-Sep-1955  Primary Care Provider: Paulene Floor Reason for referral : Care Management   An unsuccessful telephone outreach was attempted today. [name] is currently enrolled in the care management program.  A HIPAA compliant voice message was left today requesting a return call.   Follow Up Plan The care management team will reach out to [name] again within the next two to three weeks.    [facility] Care Management [facility] [phone]

## 2019-11-18 ENCOUNTER — Other Ambulatory Visit: Payer: Self-pay | Admitting: Physician Assistant

## 2019-11-18 DIAGNOSIS — M25562 Pain in left knee: Secondary | ICD-10-CM

## 2019-11-18 NOTE — Telephone Encounter (Signed)
Requested Prescriptions  Pending Prescriptions Disp Refills  . meloxicam (MOBIC) 7.5 MG tablet [Pharmacy Med Name: Meloxicam 7.5 MG Oral Tablet] 30 tablet 0    Sig: Take 1 tablet by mouth once daily     Analgesics:  COX2 Inhibitors Failed - 11/18/2019  9:32 AM      Failed - Cr in normal range and within 360 days    Creatinine  Date Value Ref Range Status  12/16/2011 0.61 0.60 - 1.30 mg/dL Final   Creatinine, Ser  Date Value Ref Range Status  08/13/2019 0.50 (L) 0.57 - 1.00 mg/dL Final         Passed - HGB in normal range and within 360 days    Hemoglobin  Date Value Ref Range Status  08/13/2019 13.8 11.1 - 15.9 g/dL Final         Passed - Patient is not pregnant      Passed - Valid encounter within last 12 months    Recent Outpatient Visits          1 month ago Acute pain of left knee   [place], Adriana M, PA-C   3 months ago Type 2 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, without long-term current use of insulin, unspecified laterality [facility])   [place], [place], PA-C   6 months ago Diabetes mellitus without complication [facility])   [facility] Carles Collet M, [place]   11 months ago Essential hypertension   [place], Wendee Beavers, [place]   1 year ago Annual physical exam   [facility] Trinna Post, [place]      Future Appointments            In 4 weeks Trinna Post, PA-C [employer], [facility]

## 2019-11-19 ENCOUNTER — Encounter: Payer: 59 | Admitting: Obstetrics and Gynecology

## 2019-11-25 ENCOUNTER — Ambulatory Visit: Payer: Medicare Other

## 2019-11-25 DIAGNOSIS — E113299 Type 2 diabetes mellitus with mild nonproliferative diabetic retinopathy without macular edema, unspecified eye: Secondary | ICD-10-CM

## 2019-11-25 NOTE — Chronic Care Management (AMB) (Signed)
Care Management   Follow Up Note   11/25/2019 Name: KAYDENCE MENARD MRN: [id number] DOB: 02-16-1956  Primary Care Provider: Trinna Post, PA-C Reason for referral :  Care Management   ARTESIA BERKEY is a 64 y.o. year old female who is a primary care patient of Trinna Post, [place]. A brief telephonic outreach as conducted today. She has met her care management goals.  Review of Ms. Espey's status, including review of consultants reports, relevant labs and test results was conducted today. Collaboration with appropriate care team members was performed as part of the comprehensive evaluation and provision of chronic care management services.    SDOH (Social Determinants of Health) assessments performed: No   Outpatient Encounter Medications as of 11/25/2019  Medication Sig  . amLODipine (NORVASC) 5 MG tablet Take 1 tablet (5 mg total) by mouth daily.  Marland Kitchen aspirin 81 MG tablet Take 81 mg by mouth daily.  Marland Kitchen atorvastatin (LIPITOR) 20 MG tablet Take 1 tablet by mouth once daily  . CALCIUM-VITAMIN D PO Take 800 mg by mouth daily.  . carvedilol (COREG) 6.25 MG tablet Take 1 tablet (6.25 mg total) by mouth 2 (two) times daily.  . cholecalciferol (VITAMIN D) 1000 UNITS tablet Take 1,000 Units by mouth daily.  . empagliflozin (JARDIANCE) 25 MG TABS tablet Take 1 tablet (25 mg total) by mouth daily before breakfast.  . glipiZIDE (GLUCOTROL) 10 MG tablet TAKE 1 TABLET BY MOUTH TWICE DAILY 30 MINUTES BEFORE A MEAL  . linagliptin (TRADJENTA) 5 MG TABS tablet Take 1 tablet (5 mg total) by mouth daily.  . meloxicam (MOBIC) 7.5 MG tablet Take 1 tablet by mouth once daily  . metFORMIN (GLUCOPHAGE) 1000 MG tablet Take 1 tablet (1,000 mg total) by mouth 2 (two) times daily.  Marland Kitchen olopatadine (PATANOL) 0.1 % ophthalmic solution INSTILL 1 DROP INTO EACH EYE TWICE DAILY  . quinapril (ACCUPRIL) 20 MG tablet TAKE 1 TABLET BY MOUTH AT BEDTIME   No facility-administered encounter medications on file as of  11/25/2019.      Goals Addressed            This Visit's Progress   . COMPLETED: Improved Diabetes Management       Current Barriers:  . Care Coordination needs related to Diabetes self management.  Case Manager Clinical Goal(s):  Marland Kitchen Over the next 90 days, patient will monitor blood glucose levels daily and maintain a daily log. . Over the next 90 days, patient will take all medications as prescribed. . Over the next 90 days, patient will attend all medical appointments as scheduled. . Over the next 90 days, patient will improve adherence with recommended cardiac prudent/diabetic diet. . Over the next 90 days, patient will increase activity level and engage in mild/low impact exercises.   Interventions:  . Discussed plan for ongoing care management.  Reports compliance with medications and blood glucose monitoring. She does acknowledge a need to  improve her dietary choices however feels her compliance has increased since our last outreach. Activity level has increased d/t returning to work. Reports good appetite but not fully regaining her sense of taste and smell since experiencing Covid-19 last year. Reports monitoring her blood glucose levels as recommended. Denies low readings but recalls a recent elevated fasting reading in the 160's. Reports most readings range in the 140's. She confirmed receipt of the diabetic education documents and tracking log. She is aware of what self-management changes are needed and does not feel that additional education  is required. Encouraged to contact the care management team if she opts for additional outreach and  assistance.    Patient Self Care Activities:  . Self administers medications  . Attends scheduled provider appointments . Calls pharmacy for medication refills . Attends church or other social activities . Performs ADL's independently . Performs IADL's independently . Calls provider office for new concerns or questions   Please see  past updates related to this goal by clicking on the "Past Updates" button in the selected goal            PLAN Ms. Recendiz will contact the  care management team if additional assistance or outreach is needed.    Cristy Friedlander [facility] Care Management [facility] [phone]

## 2019-11-26 ENCOUNTER — Other Ambulatory Visit: Payer: Self-pay

## 2019-11-26 ENCOUNTER — Encounter: Payer: Self-pay | Admitting: Obstetrics and Gynecology

## 2019-11-26 ENCOUNTER — Ambulatory Visit (INDEPENDENT_AMBULATORY_CARE_PROVIDER_SITE_OTHER): Payer: Medicare Other | Admitting: Obstetrics and Gynecology

## 2019-11-26 ENCOUNTER — Other Ambulatory Visit (HOSPITAL_COMMUNITY)
Admission: RE | Admit: 2019-11-26 | Discharge: 2019-11-26 | Disposition: A | Discharge status: Home / Self Care | Payer: Medicare Other | Source: Ambulatory Visit | Attending: Obstetrics and Gynecology | Admitting: Obstetrics and Gynecology

## 2019-11-26 VITALS — BP 125/80 | HR 97 | Ht 62.0 in | Wt 233.3 lb

## 2019-11-26 DIAGNOSIS — N870 Mild cervical dysplasia: Secondary | ICD-10-CM

## 2019-11-26 NOTE — Addendum Note (Signed)
Addended by: Durwin Glaze on: 11/26/2019 04:03 PM   Modules accepted: Orders

## 2019-11-26 NOTE — Progress Notes (Signed)
HPI:      [name] is a 64 y.o. G2P1011 who LMP was No LMP recorded. Patient is postmenopausal.  Subjective:   She presents today for her first Pap after LEEP.  She underwent LEEP for inadequate colposcopy.  CIN 1 was noted at LEEP.    Hx: The following portions of the patient's history were reviewed and updated as appropriate:             She  has a past medical history of Diabetes mellitus without complication ([place]), H/O multiple trauma, Hyperlipidemia, Hypertension, Low back pain, Lung collapse, Shoulder pain, and Thyroid disease. She does not have any pertinent problems on file. She  has a past surgical history that includes Thyroid surgery; Bladder surgery; Cesarean section; Tooth extraction (2016); and Colonoscopy with propofol (N/A, 04/20/2017). Her family history includes Aneurysm in her father; Breast cancer in her sister; Diabetes in her mother and sister; Heart attack in her mother; Stroke in her mother. She  reports that she has quit smoking. Her smoking use included cigarettes. She has never used smokeless tobacco. She reports that she does not drink alcohol and does not use drugs. She has a current medication list which includes the following prescription(s): amlodipine, aspirin, atorvastatin, calcium-vitamin d, carvedilol, cholecalciferol, empagliflozin, glipizide, linagliptin, meloxicam, metformin, olopatadine, and quinapril. She is allergic to mevacor [lovastatin].       Review of Systems:  Review of Systems  Constitutional: Denied constitutional symptoms, night sweats, recent illness, fatigue, fever, insomnia and weight loss.  Eyes: Denied eye symptoms, eye pain, photophobia, vision change and visual disturbance.  Ears/Nose/Throat/Neck: Denied ear, nose, throat or neck symptoms, hearing loss, nasal discharge, sinus congestion and sore throat.  Cardiovascular: Denied cardiovascular symptoms, arrhythmia, chest pain/pressure, edema, exercise intolerance, orthopnea and  palpitations.  Respiratory: Denied pulmonary symptoms, asthma, pleuritic pain, productive sputum, cough, dyspnea and wheezing.  Gastrointestinal: Denied, gastro-esophageal reflux, melena, nausea and vomiting.  Genitourinary: Denied genitourinary symptoms including symptomatic vaginal discharge, pelvic relaxation issues, and urinary complaints.  Musculoskeletal: Denied musculoskeletal symptoms, stiffness, swelling, muscle weakness and myalgia.  Dermatologic: Denied dermatology symptoms, rash and scar.  Neurologic: Denied neurology symptoms, dizziness, headache, neck pain and syncope.  Psychiatric: Denied psychiatric symptoms, anxiety and depression.  Endocrine: Denied endocrine symptoms including hot flashes and night sweats.   Meds:   Current Outpatient Medications on File Prior to Visit  Medication Sig Dispense Refill  . amLODipine (NORVASC) 5 MG tablet Take 1 tablet (5 mg total) by mouth daily. 90 tablet 1  . aspirin 81 MG tablet Take 81 mg by mouth daily.    Marland Kitchen atorvastatin (LIPITOR) 20 MG tablet Take 1 tablet by mouth once daily 90 tablet 0  . CALCIUM-VITAMIN D PO Take 800 mg by mouth daily.    . carvedilol (COREG) 6.25 MG tablet Take 1 tablet (6.25 mg total) by mouth 2 (two) times daily. 180 tablet 1  . cholecalciferol (VITAMIN D) 1000 UNITS tablet Take 1,000 Units by mouth daily.    . empagliflozin (JARDIANCE) 25 MG TABS tablet Take 1 tablet (25 mg total) by mouth daily before breakfast. 90 tablet 1  . glipiZIDE (GLUCOTROL) 10 MG tablet TAKE 1 TABLET BY MOUTH TWICE DAILY 30 MINUTES BEFORE A MEAL 180 tablet 1  . linagliptin (TRADJENTA) 5 MG TABS tablet Take 1 tablet (5 mg total) by mouth daily. 90 tablet 1  . meloxicam (MOBIC) 7.5 MG tablet Take 1 tablet by mouth once daily 30 tablet 0  . metFORMIN (GLUCOPHAGE) 1000 MG  tablet Take 1 tablet (1,000 mg total) by mouth 2 (two) times daily. 180 tablet 1  . olopatadine (PATANOL) 0.1 % ophthalmic solution INSTILL 1 DROP INTO EACH EYE TWICE  DAILY  3  . quinapril (ACCUPRIL) 20 MG tablet TAKE 1 TABLET BY MOUTH AT BEDTIME 90 tablet 1   No current facility-administered medications on file prior to visit.    Objective:     Vitals:   11/26/19 0958  BP: 125/80  Pulse: 97              Physical examination   Pelvic:   Vulva: Normal appearance.  No lesions.  Vagina: No lesions or abnormalities noted.  Support: Normal pelvic support.  Urethra No masses tenderness or scarring.  Meatus Normal size without lesions or prolapse.  Cervix: Normal appearance.  No lesions.  Obviously status post procedure-stenosis noted  Anus: Normal exam.  No lesions.  Perineum: Normal exam.  No lesions.    Assessment:    G2P1011 Patient Active Problem List   Diagnosis Date Noted  . Type 2 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, without long-term current use of insulin ([place]) 04/23/2019  . Morbid obesity ([place]) 12/19/2018  . Mild nonproliferative diabetic retinopathy ([place]) 08/07/2018  . Diabetes mellitus without complication ([place]) 45/62/5638  . GERD (gastroesophageal reflux disease) 08/14/2014  . Hyperlipidemia   . Hypertension   . Lumbar radiculitis 06/19/2012     1. CIN I (cervical intraepithelial neoplasia I)     First follow-up Pap after LEEP   Plan:            1.  Pap performed.  We will contact her with abnormal results.  Plan follow-up Pap in 6 months. Orders No orders of the defined types were placed in this encounter.   No orders of the defined types were placed in this encounter.     F/U  No follow-ups on file. I spent 23 minutes involved in the care of this patient preparing to see the patient by obtaining and reviewing her medical history (including labs, imaging tests and prior procedures), documenting clinical information in the electronic health record (EHR), counseling and coordinating care plans, writing and sending prescriptions, ordering tests or procedures and directly  communicating with the patient by discussing pertinent items from her history and physical exam as well as detailing my assessment and plan as noted above so that she has an informed understanding.  All of her questions were answered.  Finis Bud, M.D. 11/26/2019 10:18 AM

## 2019-12-01 LAB — CYTOLOGY - PAP
Comment: NEGATIVE
Diagnosis: NEGATIVE
High risk HPV: NEGATIVE

## 2019-12-01 NOTE — Patient Instructions (Addendum)
Thank you for allowing the Chronic Care Management team to participate in your care.  Goals Addressed            This Visit's Progress   . COMPLETED: Improved Diabetes Management       Current Barriers:  . Care Coordination needs related to Diabetes self management.  Case Manager Clinical Goal(s):  Marland Kitchen Over the next 90 days, patient will monitor blood glucose levels daily and maintain a daily log. . Over the next 90 days, patient will take all medications as prescribed. . Over the next 90 days, patient will attend all medical appointments as scheduled. . Over the next 90 days, patient will improve adherence with recommended cardiac prudent/diabetic diet. . Over the next 90 days, patient will increase activity level and engage in mild/low impact exercises.   Interventions:  . Discussed plan for ongoing care management.  Reports compliance with medications and blood glucose monitoring. She does acknowledge a need to  improve her dietary choices however feels her compliance has increased since our last outreach. Activity level has increased d/t returning to work. Reports good appetite but not fully regaining her sense of taste and smell since experiencing Covid-19 last year. Reports monitoring her blood glucose levels as recommended. Denies low readings but recalls a recent elevated fasting reading in the 160's. Reports most readings range in the 140's. She confirmed receipt of the diabetic education documents and tracking log. She is aware of what self-management changes are needed and does not feel that additional education is required. Encouraged to contact the care management team if she opts for additional outreach and  assistance.    Patient Self Care Activities:  . Self administers medications  . Attends scheduled provider appointments . Calls pharmacy for medication refills . Attends church or other social activities . Performs ADL's independently . Performs IADL's  independently . Calls provider office for new concerns or questions   Please see past updates related to this goal by clicking on the "Past Updates" button in the selected goal        Ms. Heckmann verbalized understanding of the information discussed during the telephonic outreach today. Declined need for a mailed/printed copy of the instructions.   Ms. Nixon will contact the  care management team if additional assistance or outreach is needed.    [facility]risty [facility] Care Management [facility] [phone]

## 2019-12-17 ENCOUNTER — Ambulatory Visit (INDEPENDENT_AMBULATORY_CARE_PROVIDER_SITE_OTHER): Payer: Medicare Other | Admitting: Physician Assistant

## 2019-12-17 ENCOUNTER — Other Ambulatory Visit: Payer: Self-pay

## 2019-12-17 DIAGNOSIS — M25562 Pain in left knee: Secondary | ICD-10-CM

## 2019-12-17 DIAGNOSIS — Z0000 Encounter for general adult medical examination without abnormal findings: Secondary | ICD-10-CM | POA: Diagnosis not present

## 2019-12-17 DIAGNOSIS — Z780 Asymptomatic menopausal state: Secondary | ICD-10-CM

## 2019-12-17 DIAGNOSIS — I10 Essential (primary) hypertension: Secondary | ICD-10-CM

## 2019-12-17 DIAGNOSIS — E782 Mixed hyperlipidemia: Secondary | ICD-10-CM | POA: Diagnosis not present

## 2019-12-17 DIAGNOSIS — Z1231 Encounter for screening mammogram for malignant neoplasm of breast: Secondary | ICD-10-CM | POA: Diagnosis not present

## 2019-12-17 DIAGNOSIS — Z23 Encounter for immunization: Secondary | ICD-10-CM

## 2019-12-17 DIAGNOSIS — E113299 Type 2 diabetes mellitus with mild nonproliferative diabetic retinopathy without macular edema, unspecified eye: Secondary | ICD-10-CM | POA: Diagnosis not present

## 2019-12-17 LAB — POCT GLYCOSYLATED HEMOGLOBIN (HGB A1C): Hemoglobin A1C: 7.1 % — AB (ref 4.0–5.6)

## 2019-12-17 MED ORDER — ONETOUCH VERIO VI STRP: ORAL_STRIP | 12 refills | Status: AC

## 2019-12-17 MED ORDER — MELOXICAM 7.5 MG PO TABS: 7.5000 mg | ORAL_TABLET | Freq: Every day | ORAL | 0 refills | Status: DC

## 2019-12-17 NOTE — Patient Instructions (Signed)
Call your insurance about shingles vaccine called [employer] to see where it is paid for

## 2019-12-17 NOTE — Addendum Note (Signed)
Addended by: Wilburt Finlay on: 12/17/2019 02:14 PM   Modules accepted: Orders

## 2019-12-17 NOTE — Progress Notes (Signed)
Complete physical exam   Patient: Chelsea Gomez   DOB: [birth year]   64 y.o. Female  MRN: [id number] Visit Date: 12/17/2019  Today's healthcare provider: Trinna Post, PA-C   Chief Complaint  Patient presents with  . Annual Exam  . Diabetes   Subjective    Chelsea Gomez is a 64 y.o. female who presents today for a complete physical exam.  She reports consuming a general diet. The patient does not participate in regular exercise at present. She generally feels well. She reports sleeping well. She does not have additional problems to discuss today.   Diabetes Mellitus Type II, Follow-up  Lab Results  Component Value Date   HGBA1C 7.3 (A) 08/13/2019   HGBA1C 7.9 (A) 04/23/2019   HGBA1C 7.7 (A) 12/19/2018   Wt Readings from Last 3 Encounters:  11/26/19 233 lb 4.8 oz (105.8 kg)  10/16/19 228 lb (103.4 kg)  08/13/19 234 lb 6.4 oz (106.3 kg)   Last seen for diabetes 4 months ago.  Management since then includes no changes. She reports good compliance with treatment. She is not having side effects.  Symptoms: No fatigue No foot ulcerations  No appetite changes No nausea  No paresthesia of the feet  No polydipsia  No polyuria No visual disturbances   No vomiting     Home blood sugar records: fasting range: 120s  Episodes of hypoglycemia? No    Current insulin regiment: none Most Recent Eye Exam: up to date  Current exercise: none Current diet habits: well balanced  Pertinent Labs: Lab Results  Component Value Date   CHOL 125 08/13/2019   HDL 36 (L) 08/13/2019   LDLCALC 64 08/13/2019   TRIG 141 08/13/2019   CHOLHDL 3.5 08/13/2019   Lab Results  Component Value Date   NA 142 08/13/2019   K 3.9 08/13/2019   CREATININE 0.50 (L) 08/13/2019   GFRNONAA 103 08/13/2019   GFRAA 118 08/13/2019   GLUCOSE 103 (H) 08/13/2019     Hypertension, follow-up  BP Readings from Last 3 Encounters:  11/26/19 125/80  10/16/19 136/76  08/13/19 118/78   Wt Readings  from Last 3 Encounters:  11/26/19 233 lb 4.8 oz (105.8 kg)  10/16/19 228 lb (103.4 kg)  08/13/19 234 lb 6.4 oz (106.3 kg)     She was last seen for hypertension 4 months ago.  BP at that visit was 118/78. Management since that visit includes continuing quinapril 20 mg daily  She reports good compliance with treatment. She is not having side effects.  She is following a Regular diet. She is not exercising. She does not smoke.  Use of agents associated with hypertension: none.   Outside blood pressures are checked occasionally. Symptoms: No chest pain No chest pressure  No palpitations No syncope  No dyspnea No orthopnea  No paroxysmal nocturnal dyspnea No lower extremity edema   Pertinent labs: Lab Results  Component Value Date   CHOL 125 08/13/2019   HDL 36 (L) 08/13/2019   LDLCALC 64 08/13/2019   TRIG 141 08/13/2019   CHOLHDL 3.5 08/13/2019   Lab Results  Component Value Date   NA 142 08/13/2019   K 3.9 08/13/2019   CREATININE 0.50 (L) 08/13/2019   GFRNONAA 103 08/13/2019   GFRAA 118 08/13/2019   GLUCOSE 103 (H) 08/13/2019     The ASCVD Risk score (Goff DC Jr., et al., 2013) failed to calculate for the following reasons:   The valid total cholesterol range is  130 to 320 mg/dL   Lipid/Cholesterol, Follow-up  Last lipid panel Other pertinent labs  Lab Results  Component Value Date   CHOL 125 08/13/2019   HDL 36 (L) 08/13/2019   LDLCALC 64 08/13/2019   TRIG 141 08/13/2019   CHOLHDL 3.5 08/13/2019   Lab Results  Component Value Date   ALT 14 08/13/2019   AST 17 08/13/2019   PLT 303 08/13/2019   TSH 1.360 08/13/2019     She was last seen for this 4 months ago.  Management since that visit includes no changes.  She reports good compliance with treatment. She is not having side effects.   Symptoms: No chest pain No chest pressure/discomfort  No dyspnea No lower extremity edema  No numbness or tingling of extremity No orthopnea  No palpitations No  paroxysmal nocturnal dyspnea  No speech difficulty No syncope     Past Medical History:  Diagnosis Date  . Diabetes mellitus without complication ([place])   . H/O multiple trauma   . Hyperlipidemia   . Hypertension   . Low back pain   . Lung collapse   . Shoulder pain    left  . Thyroid disease    Past Surgical History:  Procedure Laterality Date  . BLADDER SURGERY    . CESAREAN SECTION    . COLONOSCOPY WITH PROPOFOL N/A 04/20/2017   Procedure: COLONOSCOPY WITH PROPOFOL;  Surgeon: Jonathon Bellows, MD;  [facility];  Service: Gastroenterology;  Laterality: N/A;  . THYROID SURGERY    . TOOTH EXTRACTION  2016   Social History   Socioeconomic History  . Marital status: Single    Spouse name: Not on file  . Number of children: Not on file  . Years of education: Not on file  . Highest education level: Not on file  Occupational History  . Not on file  Tobacco Use  . Smoking status: Former Smoker    Types: Cigarettes  . Smokeless tobacco: Never Used  Vaping Use  . Vaping Use: Never used  Substance and Sexual Activity  . Alcohol use: No  . Drug use: No  . Sexual activity: Not Currently    Birth control/protection: Post-menopausal  Other Topics Concern  . Not on file  Social History Narrative  . Not on file   Social Determinants of Health   Financial Resource Strain:   . Difficulty of Paying Living Expenses: Not on file  Food Insecurity: No Food Insecurity  . Worried About Charity fundraiser in the Last Year: Never true  . Ran Out of Food in the Last Year: Never true  Transportation Needs: No Transportation Needs  . Lack of Transportation (Medical): No  . Lack of Transportation (Non-Medical): No  Physical Activity:   . Days of Exercise per Week: Not on file  . Minutes of Exercise per Session: Not on file  Stress:   . Feeling of Stress : Not on file  Social Connections:   . Frequency of Communication with Friends and Family: Not on file  . Frequency of  Social Gatherings with Friends and Family: Not on file  . Attends Religious Services: Not on file  . Active Member of Clubs or Organizations: Not on file  . Attends Archivist Meetings: Not on file  . Marital Status: Not on file  Intimate Partner Violence:   . Fear of Current or Ex-Partner: Not on file  . Emotionally Abused: Not on file  . Physically Abused: Not on file  .  Sexually Abused: Not on file   Family Status  Relation Name Status  . Mother  Deceased  . Father  Deceased  . Sister  Alive       dx at age 66  . Neg Hx  (Not Specified)   Family History  Problem Relation Age of Onset  . Heart attack Mother   . Stroke Mother   . Diabetes Mother   . Aneurysm Father   . Breast cancer Sister   . Diabetes Sister   . Ovarian cancer Neg Hx   . Colon cancer Neg Hx    Allergies  Allergen Reactions  . Mevacor [Lovastatin] Other (See Comments)    Muscle cramps    Patient Care Team: Paulene Floor as PCP - General (Physician Assistant) Neldon Labella, RN as Case Manager   Medications: Outpatient Medications Prior to Visit  Medication Sig  . amLODipine (NORVASC) 5 MG tablet Take 1 tablet (5 mg total) by mouth daily.  Marland Kitchen aspirin 81 MG tablet Take 81 mg by mouth daily.  Marland Kitchen atorvastatin (LIPITOR) 20 MG tablet Take 1 tablet by mouth once daily  . CALCIUM-VITAMIN D PO Take 800 mg by mouth daily.  . carvedilol (COREG) 6.25 MG tablet Take 1 tablet (6.25 mg total) by mouth 2 (two) times daily.  . cholecalciferol (VITAMIN D) 1000 UNITS tablet Take 1,000 Units by mouth daily.  . empagliflozin (JARDIANCE) 25 MG TABS tablet Take 1 tablet (25 mg total) by mouth daily before breakfast.  . glipiZIDE (GLUCOTROL) 10 MG tablet TAKE 1 TABLET BY MOUTH TWICE DAILY 30 MINUTES BEFORE A MEAL  . linagliptin (TRADJENTA) 5 MG TABS tablet Take 1 tablet (5 mg total) by mouth daily.  . meloxicam (MOBIC) 7.5 MG tablet Take 1 tablet by mouth once daily  . metFORMIN (GLUCOPHAGE) 1000 MG  tablet Take 1 tablet (1,000 mg total) by mouth 2 (two) times daily.  Marland Kitchen olopatadine (PATANOL) 0.1 % ophthalmic solution INSTILL 1 DROP INTO EACH EYE TWICE DAILY  . quinapril (ACCUPRIL) 20 MG tablet TAKE 1 TABLET BY MOUTH AT BEDTIME   No facility-administered medications prior to visit.    Review of Systems  Constitutional: Negative.   HENT: Negative.   Eyes: Negative.   Respiratory: Negative.   Cardiovascular: Negative.   Gastrointestinal: Negative.   Endocrine: Negative.   Genitourinary: Negative.   Musculoskeletal: Negative.   Skin: Negative.   Allergic/Immunologic: Negative.   Neurological: Negative.   Hematological: Negative.   Psychiatric/Behavioral: Negative.       Objective    There were no vitals taken for this visit.   Physical Exam Constitutional:      Appearance: Normal appearance.  Cardiovascular:     Rate and Rhythm: Normal rate and regular rhythm.     Heart sounds: Normal heart sounds.  Pulmonary:     Effort: Pulmonary effort is normal.     Breath sounds: Normal breath sounds.  Skin:    General: Skin is warm and dry.  Neurological:     Mental Status: She is alert and oriented to person, place, and time. Mental status is at baseline.  Psychiatric:        Mood and Affect: Mood normal.        Behavior: Behavior normal.       Last depression screening scores PHQ 2/9 Scores 05/22/2019 03/11/2019 09/18/2018  PHQ - 2 Score 0 0 0  PHQ- 9 Score - - 2   Last fall risk screening Fall Risk  05/22/2019  Falls in the past year? 0  Number falls in past yr: -  Injury with Fall? -  Risk for fall due to : -  Follow up -   Last Audit-C alcohol use screening Alcohol Use Disorder Test (AUDIT) 09/18/2018  1. How often do you have a drink containing alcohol? 0  2. How many drinks containing alcohol do you have on a typical day when you are drinking? 0  3. How often do you have six or more drinks on one occasion? 0  AUDIT-C Score 0   A score of 3 or more in women,  and 4 or more in men indicates increased risk for alcohol abuse, EXCEPT if all of the points are from question 1   No results found for any visits on 12/17/19.  Assessment & Plan    Routine Health Maintenance and Physical Exam  Exercise Activities and Dietary recommendations Goals    .  My copay is high for Januvia and pravastatin (pt-stated)      Current Barriers:  . High copays on pravastatin and Januvia  . "stretching out" these medications to make them last longer (taking Januvia every 3 days)  Pharmacist Clinical Goal(s):  Over the next 7 days, Ms. Blankenbeckler will successfully activate Januvia coupon and inquire about pravastatin cash price at her pharmacy, as evidenced by patient report.   Interventions: . Provided coupon for Januvia and instructions on how to activate . Provided information about [facility]'s cash price list . Will consider alternative to pravastatin with a lower copay on patient's insurance or cash price if necessary  Patient Self Care Activities:  . Self administers medications as prescribed  Initial goal documentation        Immunization History  Administered Date(s) Administered  . Influenza,inj,Quad PF,6+ Mos 12/19/2018  . Tdap 09/18/2018    Health Maintenance  Topic Date Due  . COVID-19 Vaccine (1) Never done  . DEXA SCAN  Never done  . PNA vac Low Risk Adult (1 of 2 - PCV13) Never done  . INFLUENZA VACCINE  10/05/2019  . HEMOGLOBIN A1C  02/12/2020  . COLONOSCOPY  04/20/2020  . FOOT EXAM  08/12/2020  . MAMMOGRAM  08/13/2020  . OPHTHALMOLOGY EXAM  11/24/2020  . PAP SMEAR-Modifier  11/26/2022  . TETANUS/TDAP  09/17/2028  . Hepatitis C Screening  Completed  . HIV Screening  Completed    Discussed health benefits of physical activity, and encouraged her to engage in regular exercise appropriate for her age and condition.  1. Annual physical exam  Due for Colonoscopy 06/2020, will order at follow up.   2. Type 2 diabetes mellitus with mild  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, without long-term current use of insulin, unspecified laterality ([place])  Well controlled, continue current medications.   - POCT glycosylated hemoglobin (Hb A1C) - glucose blood (ONETOUCH VERIO) test strip; Use as instructed  Dispense: 100 each; Refill: 12  3. Essential hypertension  Well controlled, continue current medications.  4. Mixed hyperlipidemia  Well controlled, continue current medications.  5. Need for vaccination against Streptococcus pneumoniae  Updated pneumovax today. She declines flu shot.  6. Need for shingles vaccine  Call insurance.  7. Postmenopausal  - DG Bone Density; Future  8. Encounter for screening mammogram for malignant neoplasm of breast  - MM Digital Screening; Future  9. Acute pain of left knee  Advised meloxicam is not to be taken every day but only sparing use. Would recommend largely using tylenol. Discussed risks of chronic NSAID  use including renal damage and ulcers. Discussed trial of steroid injection.  - meloxicam (MOBIC) 7.5 MG tablet; Take 1 tablet (7.5 mg total) by mouth daily.  Dispense: 30 tablet; Refill: 0  F/u 4 months chronic     I, Trinna Post, PA-C, have reviewed all documentation for this visit. The documentation on 12/17/19 for the exam, diagnosis, procedures, and orders are all accurate and complete.  The entirety of the information documented in the History of Present Illness, Review of Systems and Physical Exam were personally obtained by me. Portions of this information were initially documented by Wilburt Finlay, CMA and reviewed by me for thoroughness and accuracy.     Paulene Floor  [facility] [phone] (phone) [phone] (fax)  [place]

## 2019-12-27 ENCOUNTER — Other Ambulatory Visit: Payer: Self-pay | Admitting: Physician Assistant

## 2019-12-27 DIAGNOSIS — E119 Type 2 diabetes mellitus without complications: Secondary | ICD-10-CM

## 2019-12-27 NOTE — Telephone Encounter (Signed)
Requested Prescriptions  Pending Prescriptions Disp Refills   metFORMIN (GLUCOPHAGE) 1000 MG tablet [Pharmacy Med Name: metFORMIN HCl 1000 MG Oral Tablet] 180 tablet 0    Sig: Take 1 tablet by mouth twice daily     Endocrinology:  Diabetes - Biguanides Failed - 12/27/2019 12:33 PM      Failed - Cr in normal range and within 360 days    Creatinine  Date Value Ref Range Status  12/16/2011 0.61 0.60 - 1.30 mg/dL Final   Creatinine, Ser  Date Value Ref Range Status  08/13/2019 0.50 (L) 0.57 - 1.00 mg/dL Final         Passed - HBA1C is between 0 and 7.9 and within 180 days    Hemoglobin A1C  Date Value Ref Range Status  12/17/2019 7.1 (A) 4.0 - 5.6 % Final   Hgb A1c MFr Bld  Date Value Ref Range Status  08/17/2015 7.9 (H) 4.8 - 5.6 % Final    Comment:             Pre-diabetes: 5.7 - 6.4          Diabetes: >6.4          Glycemic control for adults with diabetes: <7.0          Passed - eGFR in normal range and within 360 days    EGFR (African American)  Date Value Ref Range Status  12/16/2011 >60  Final   GFR calc Af Amer  Date Value Ref Range Status  08/13/2019 118 >59 mL/min/1.73 Final    Comment:    **[facility] currently reports eGFR in compliance with the current**   recommendations of the [employer]. [facility] will   update reporting as new guidelines are published from the NKF-ASN   Task force.    EGFR (Non-African Amer.)  Date Value Ref Range Status  12/16/2011 >60  Final    Comment:    eGFR values <65m/min/1.73 m2 may be an indication of chronic kidney disease (CKD). Calculated eGFR is useful in patients with stable renal function. The eGFR calculation will not be reliable in acutely ill patients when serum creatinine is changing rapidly. It is not useful in  patients on dialysis. The eGFR calculation may not be applicable to patients at the low and high extremes of body sizes, pregnant women, and vegetarians. POTASSIUM,AST - Slight hemolysis,  interpret results with  - caution.    GFR calc non Af Amer  Date Value Ref Range Status  08/13/2019 103 >59 mL/min/1.73 Final         Passed - Valid encounter within last 6 months    Recent Outpatient Visits          1 week ago [facility] [place]  2 months ago [place] pain of left [place]   [place]   4 months ago Type 2 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, without long-term current use of insulin, unspecified laterality ([facility]   [place]   8 months ago Diabetes mellitus without complication ([facility]   [facility] [place]  1 year ago Essential hypertension   [facility] [place]     Future Appointments            In 3 months [facility]

## 2020-01-05 ENCOUNTER — Telehealth: Payer: Self-pay

## 2020-01-05 NOTE — Telephone Encounter (Signed)
Copied from [place] [id number]. Topic: Quick Communication - See Telephone Encounter >> Jan 05, 2020 11:50 AM Loma [place] wrote: CRM for notification. See Telephone encounter for: 01/05/20. FU with pt, has order in system for Bone Density and Mammo but has gone to [facility] of course they can not see. Pt wants fu on best thing to do as she was wanting to go where has been in the past.  FU at [phone]

## 2020-01-06 ENCOUNTER — Telehealth: Payer: Self-pay

## 2020-01-06 NOTE — Telephone Encounter (Signed)
Copied from [place] [id number]. Topic: General - Other >> Jan 06, 2020 11:16 AM Gillis Ends D wrote: Reason for CRM: Patient has called to schedule her bone density test but the office states that they do not have the order. She would like for the order to be faxed to [phone]. If you have any questions she can be reached at [phone]. Please advise

## 2020-01-06 NOTE — Telephone Encounter (Signed)
Where is she trying to order it? The order was placed at last visit. [place] shouldn't need a fax. If she wants it outside of [place] she needs to tell me the clinic.

## 2020-01-06 NOTE — Telephone Encounter (Signed)
Spoke to patient and she states that the order should have went to [facility] because she gets here mammograms there but she will go to [place] since the order is already placed there. Just a [place]

## 2020-01-09 LAB — HM MAMMOGRAPHY

## 2020-02-06 ENCOUNTER — Other Ambulatory Visit: Payer: Self-pay | Admitting: Physician Assistant

## 2020-02-06 DIAGNOSIS — E1169 Type 2 diabetes mellitus with other specified complication: Secondary | ICD-10-CM

## 2020-02-06 DIAGNOSIS — E785 Hyperlipidemia, unspecified: Secondary | ICD-10-CM

## 2020-02-06 DIAGNOSIS — M25562 Pain in left knee: Secondary | ICD-10-CM

## 2020-02-06 NOTE — Telephone Encounter (Signed)
Requested Prescriptions  Pending Prescriptions Disp Refills   meloxicam (MOBIC) 7.5 MG tablet [Pharmacy Med Name: Meloxicam 7.5 MG Oral Tablet] 30 tablet 0    Sig: Take 1 tablet by mouth once daily     Analgesics:  COX2 Inhibitors Failed - 02/06/2020  2:33 PM      Failed - Cr in normal range and within 360 days    Creatinine  Date Value Ref Range Status  12/16/2011 0.61 0.60 - 1.30 mg/dL Final   Creatinine, Ser  Date Value Ref Range Status  08/13/2019 0.50 (L) 0.57 - 1.00 mg/dL Final         Passed - HGB in normal range and within 360 days    Hemoglobin  Date Value Ref Range Status  08/13/2019 13.8 11.1 - 15.9 g/dL Final         Passed - Patient is not pregnant      Passed - Valid encounter within last 12 months    Recent Outpatient Visits          1 month ago Annual physical exam   [employer], Adriana M, PA-C   3 months ago Acute pain of left knee   [place], Adriana M, PA-C   5 months ago Type 2 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, without long-term current use of insulin, unspecified laterality [facility])   [facility] [place], Adriana M, PA-C   9 months ago Diabetes mellitus without complication [facility])   [facility] [place], [place], [place]   1 year ago Essential hypertension   [place], Wendee [place], [place]      Future Appointments            In 2 months [place], Adriana M, PA-C [employer], [facility]            atorvastatin (LIPITOR) 20 MG tablet [employer] Med Name: Atorvastatin Calcium 20 MG Oral Tablet] 90 tablet 3    Sig: Take 1 tablet by mouth once daily     Cardiovascular:  Antilipid - Statins Failed - 02/06/2020  2:33 PM      Failed - LDL in normal range and within 360 days    LDL Chol Calc (NIH)  Date Value Ref Range Status  08/13/2019 64 0 - 99 mg/dL Final         Failed - HDL in normal range and within  360 days    HDL  Date Value Ref Range Status  08/13/2019 36 (L) >39 mg/dL Final         Passed - Total Cholesterol in normal range and within 360 days    Cholesterol, Total  Date Value Ref Range Status  08/13/2019 125 100 - 199 mg/dL Final         Passed - Triglycerides in normal range and within 360 days    Triglycerides  Date Value Ref Range Status  08/13/2019 141 0 - 149 mg/dL Final         Passed - Patient is not pregnant      Passed - Valid encounter within last 12 months    Recent Outpatient Visits          1 month ago Annual physical exam   [employer], Adriana M, PA-C   3 months ago Acute pain of left knee   [place], [place], [place]   5 months ago Type 2 diabetes mellitus with mild nonproliferative  retinopathy without macular edema, without long-term current use of insulin, unspecified laterality [facility])   [place], [place], [place]   9 months ago Diabetes mellitus without complication [facility])   [facility] Trinna Post, [place]   1 year ago Essential hypertension   [facility] Trinna Post, [place]      Future Appointments            In 2 months Trinna Post, PA-C [employer], [place]

## 2020-02-06 NOTE — Telephone Encounter (Signed)
Requested medication (s) are due for refill today: yes  Requested medication (s) are on the active medication list: yes  Last refill: 12/17/19  #30  0 refills  Future visit scheduled:yes  Notes to clinic:  Cr 0.50    Requested Prescriptions  Pending Prescriptions Disp Refills   meloxicam (MOBIC) 7.5 MG tablet [Pharmacy Med Name: Meloxicam 7.5 MG Oral Tablet] 30 tablet 0    Sig: Take 1 tablet by mouth once daily      Analgesics:  COX2 Inhibitors Failed - 02/06/2020  2:33 PM      Failed - Cr in normal range and within 360 days    Creatinine  Date Value Ref Range Status  12/16/2011 0.61 0.60 - 1.30 mg/dL Final   Creatinine, Ser  Date Value Ref Range Status  08/13/2019 0.50 (L) 0.57 - 1.00 mg/dL Final          Passed - HGB in normal range and within 360 days    Hemoglobin  Date Value Ref Range Status  08/13/2019 13.8 11.1 - 15.9 g/dL Final          Passed - Patient is not pregnant      Passed - Valid encounter within last 12 months    Recent Outpatient Visits           1 month ago Annual physical exam   [employer], Adriana M, PA-C   3 months ago Acute pain of left knee   [place], Adriana M, PA-C   5 months ago Type 2 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, without long-term current use of insulin, unspecified laterality [facility])   [facility] [place], Adriana M, PA-C   9 months ago Diabetes mellitus without complication [facility])   [facility], [place], [place]   1 year ago Essential hypertension   [place], Wendee Beavers, [place]       Future Appointments             In 2 months [place], Adriana M, PA-C [employer]             Signed Prescriptions Disp Refills   atorvastatin (LIPITOR) 20 MG tablet 90 tablet 3    Sig: Take 1 tablet by mouth once daily      Cardiovascular:  Antilipid - Statins Failed -  02/06/2020  2:33 PM      Failed - LDL in normal range and within 360 days    LDL Chol Calc (NIH)  Date Value Ref Range Status  08/13/2019 64 0 - 99 mg/dL Final          Failed - HDL in normal range and within 360 days    HDL  Date Value Ref Range Status  08/13/2019 36 (L) >39 mg/dL Final          Passed - Total Cholesterol in normal range and within 360 days    Cholesterol, Total  Date Value Ref Range Status  08/13/2019 125 100 - 199 mg/dL Final          Passed - Triglycerides in normal range and within 360 days    Triglycerides  Date Value Ref Range Status  08/13/2019 141 0 - 149 mg/dL Final          Passed - Patient is not pregnant      Passed - Valid encounter within last 12 months    Recent Outpatient Visits  1 month ago Annual physical exam   [facility], [place]   3 months ago Acute pain of left knee   [place], [place], [place]   5 months ago Type 2 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, without long-term current use of insulin, unspecified laterality [facility])   [place], [place], [place]   9 months ago Diabetes mellitus without complication [facility])   [facility] Trinna Post, [place]   1 year ago Essential hypertension   [facility] Trinna Post, [place]       Future Appointments             In 2 months Trinna Post, PA-C [employer], [facility]

## 2020-03-04 ENCOUNTER — Other Ambulatory Visit: Payer: Self-pay | Admitting: Physician Assistant

## 2020-03-04 DIAGNOSIS — E119 Type 2 diabetes mellitus without complications: Secondary | ICD-10-CM

## 2020-03-04 DIAGNOSIS — I10 Essential (primary) hypertension: Secondary | ICD-10-CM

## 2020-04-14 ENCOUNTER — Encounter: Payer: Self-pay | Admitting: Physician Assistant

## 2020-04-14 ENCOUNTER — Other Ambulatory Visit: Payer: Self-pay

## 2020-04-14 ENCOUNTER — Ambulatory Visit (INDEPENDENT_AMBULATORY_CARE_PROVIDER_SITE_OTHER): Payer: Medicare Other | Admitting: Physician Assistant

## 2020-04-14 VITALS — BP 137/66 | HR 94 | Temp 99.0°F | Wt 233.1 lb

## 2020-04-14 DIAGNOSIS — Z780 Asymptomatic menopausal state: Secondary | ICD-10-CM

## 2020-04-14 DIAGNOSIS — E782 Mixed hyperlipidemia: Secondary | ICD-10-CM

## 2020-04-14 DIAGNOSIS — I10 Essential (primary) hypertension: Secondary | ICD-10-CM | POA: Diagnosis not present

## 2020-04-14 DIAGNOSIS — E119 Type 2 diabetes mellitus without complications: Secondary | ICD-10-CM | POA: Diagnosis not present

## 2020-04-14 DIAGNOSIS — E113299 Type 2 diabetes mellitus with mild nonproliferative diabetic retinopathy without macular edema, unspecified eye: Secondary | ICD-10-CM | POA: Diagnosis not present

## 2020-04-14 DIAGNOSIS — D126 Benign neoplasm of colon, unspecified: Secondary | ICD-10-CM | POA: Diagnosis not present

## 2020-04-14 LAB — POCT GLYCOSYLATED HEMOGLOBIN (HGB A1C)
Est. average glucose Bld gHb Est-mCnc: 171
Hemoglobin A1C: 7.6 % — AB (ref 4.0–5.6)

## 2020-04-14 NOTE — Progress Notes (Signed)
Established patient visit   Patient: Chelsea Gomez   DOB: 08-25-1955   65 y.o. Female  MRN: [id number] Visit Date: 04/14/2020  Today's healthcare provider: Trinna Post, PA-C   Chief Complaint  Patient presents with  . Diabetes  . Hypertension  . Hyperlipidemia  I,Elice Crigger M Erikka Follmer,acting as a scribe for [employer], PA-C.,have documented all relevant documentation on the behalf of Trinna Post, PA-C,as directed by  Trinna Post, PA-C while in the presence of Trinna Post, PA-C.  Subjective    HPI  Diabetes Mellitus Type II, follow-up  Lab Results  Component Value Date   HGBA1C 7.6 (A) 04/14/2020   HGBA1C 7.1 (A) 12/17/2019   HGBA1C 7.3 (A) 08/13/2019   Last seen for diabetes 4 months ago.  Management since then includes continuing the same treatment. She reports good compliance with treatment. She is not having side effects.   Home blood sugar records: fasting range: 130's-140's  Episodes of hypoglycemia? No    Current insulin regiment: none Most Recent Eye Exam: 08/28/2019  --------------------------------------------------------------------------------------------------- Hypertension, follow-up  BP Readings from Last 3 Encounters:  04/14/20 137/66  11/26/19 125/80  10/16/19 136/76   Wt Readings from Last 3 Encounters:  04/14/20 233 lb 1.6 oz (105.7 kg)  11/26/19 233 lb 4.8 oz (105.8 kg)  10/16/19 228 lb (103.4 kg)     She was last seen for hypertension 4 months ago.  BP at that visit was normal. Management since that visit includes continue current medications. She reports good compliance with treatment. She is not having side effects.  She is not exercising. She is adherent to low salt diet.   Outside blood pressures are not being checked.  She does not smoke.  Use of agents associated with hypertension: none.    --------------------------------------------------------------------------------------------------- Lipid/Cholesterol, follow-up  Last Lipid Panel: Lab Results  Component Value Date   CHOL 128 04/14/2020   LDLCALC 75 04/14/2020   HDL 38 (L) 04/14/2020   TRIG 73 04/14/2020    She was last seen for this 4 months ago.  Management since that visit includes continue current medications.  She reports good compliance with treatment. She is not having side effects.   Symptoms: No appetite changes No foot ulcerations  No chest pain No chest pressure/discomfort  No dyspnea No orthopnea  No fatigue No lower extremity edema  No palpitations No paroxysmal nocturnal dyspnea  No nausea No numbness or tingling of extremity  No polydipsia No polyuria  No speech difficulty No syncope   She is following a Regular, Low Sodium diet. Current exercise: housecleaning and no regular exercise  Last metabolic panel Lab Results  Component Value Date   GLUCOSE 151 (H) 04/14/2020   NA 142 04/14/2020   K 4.0 04/14/2020   BUN 9 04/14/2020   CREATININE 0.50 (L) 04/14/2020   GFRNONAA 102 04/14/2020   GFRAA 117 04/14/2020   CALCIUM 9.1 04/14/2020   AST 12 04/14/2020   ALT 13 04/14/2020   The ASCVD Risk score (Goff DC Jr., et al., 2013) failed to calculate for the following reasons:   The valid total cholesterol range is 130 to 320 mg/dL  ---------------------------------------------------------------------------------------------------  She had a colonoscopy 04/2017 which showed 5 precancerous polyps. She is due for repeat colonoscopy 04/2020.     Medications: Outpatient Medications Prior to Visit  Medication Sig  . amLODipine (NORVASC) 5 MG tablet Take 1 tablet (5 mg total) by mouth daily.  Marland Kitchen aspirin 81 MG  tablet Take 81 mg by mouth daily.  Marland Kitchen atorvastatin (LIPITOR) 20 MG tablet Take 1 tablet by mouth once daily  . CALCIUM-VITAMIN D PO Take 800 mg by mouth daily.  . carvedilol (COREG) 6.25  MG tablet Take 1 tablet by mouth twice daily  . cholecalciferol (VITAMIN D) 1000 UNITS tablet Take 1,000 Units by mouth daily.  . empagliflozin (JARDIANCE) 25 MG TABS tablet Take 1 tablet (25 mg total) by mouth daily before breakfast.  . glipiZIDE (GLUCOTROL) 10 MG tablet TAKE 1 TABLET BY MOUTH TWICE DAILY 30 MINUTES BEFORE A MEAL  . glucose blood (ONETOUCH VERIO) test strip Use as instructed  . meloxicam (MOBIC) 7.5 MG tablet Take 1 tablet by mouth once daily  . metFORMIN (GLUCOPHAGE) 1000 MG tablet Take 1 tablet by mouth twice daily  . olopatadine (PATANOL) 0.1 % ophthalmic solution INSTILL 1 DROP INTO EACH EYE TWICE DAILY  . quinapril (ACCUPRIL) 20 MG tablet TAKE 1 TABLET BY MOUTH AT BEDTIME  . linagliptin (TRADJENTA) 5 MG TABS tablet Take 1 tablet (5 mg total) by mouth daily.   No facility-administered medications prior to visit.    Review of Systems  Constitutional: Negative.   Respiratory: Negative.   Cardiovascular: Negative.   Hematological: Negative.        Objective    BP 137/66 (BP Location: Left Arm, Patient Position: Sitting, Cuff Size: Large)   Pulse 94   Temp 99 F (37.2 C) (Oral)   Wt 233 lb 1.6 oz (105.7 kg)   SpO2 100%   BMI 42.63 kg/m     Physical Exam Constitutional:      Appearance: Normal appearance.  Cardiovascular:     Rate and Rhythm: Normal rate and regular rhythm.     Pulses: Normal pulses.     Heart sounds: Normal heart sounds.  Pulmonary:     Effort: Pulmonary effort is normal.     Breath sounds: Normal breath sounds.  Skin:    General: Skin is warm and dry.  Neurological:     General: No focal deficit present.     Mental Status: She is alert and oriented to person, place, and time.  Psychiatric:        Mood and Affect: Mood normal.        Behavior: Behavior normal.       Results for orders placed or performed in visit on 04/14/20  Comprehensive Metabolic Panel (CMET)  Result Value Ref Range   Glucose 151 (H) 65 - 99 mg/dL   BUN  9 8 - 27 mg/dL   Creatinine, Ser 0.50 (L) 0.57 - 1.00 mg/dL   GFR calc non Af Amer 102 >59 mL/min/1.73   GFR calc Af Amer 117 >59 mL/min/1.73   BUN/Creatinine Ratio 18 12 - 28   Sodium 142 134 - 144 mmol/L   Potassium 4.0 3.5 - 5.2 mmol/L   Chloride 103 96 - 106 mmol/L   CO2 23 20 - 29 mmol/L   Calcium 9.1 8.7 - 10.3 mg/dL   Total Protein 7.1 6.0 - 8.5 g/dL   Albumin 4.2 3.8 - 4.8 g/dL   Globulin, Total 2.9 1.5 - 4.5 g/dL   Albumin/Globulin Ratio 1.4 1.2 - 2.2   Bilirubin Total 0.3 0.0 - 1.2 mg/dL   Alkaline Phosphatase 84 44 - 121 IU/L   AST 12 0 - 40 IU/L   ALT 13 0 - 32 IU/L  Lipid Profile  Result Value Ref Range   Cholesterol, Total 128 100 - 199 mg/dL  Triglycerides 73 0 - 149 mg/dL   HDL 38 (L) >39 mg/dL   VLDL Cholesterol Cal 15 5 - 40 mg/dL   LDL Chol Calc (NIH) 75 0 - 99 mg/dL   Chol/HDL Ratio 3.4 0.0 - 4.4 ratio  POCT glycosylated hemoglobin (Hb A1C)  Result Value Ref Range   Hemoglobin A1C 7.6 (A) 4.0 - 5.6 %   HbA1c POC (<> result, manual entry)     HbA1c, POC (prediabetic range)     HbA1c, POC (controlled diabetic range)     Est. average glucose Bld gHb Est-mCnc 171     Assessment & Plan    1. Type 2 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, without long-term current use of insulin, unspecified laterality (HCC)  Slightly higher, continue medications. We will request records from Dr. Matilde Sprang for most recent eye exams.   - POCT glycosylated hemoglobin (Hb A1C) - Comprehensive Metabolic Panel (CMET) - Lipid Profile  2. Primary hypertension  Continue medications.   - Comprehensive Metabolic Panel (CMET) - Lipid Profile  3. Morbid obesity ([place])   4. Mixed hyperlipidemia  Continue statin.   5. Adenomatous polyp of colon, unspecified part of colon  Referral for GI placed.   6. Postmenopausal  - DG Bone Density; Future   Return in about 3 months (around 07/12/2020) for chronic .      ITrinna Post,  PA-C, have reviewed all documentation for this visit. The documentation on 04/20/20 for the exam, diagnosis, procedures, and orders are all accurate and complete.  The entirety of the information documented in the History of Present Illness, Review of Systems and Physical Exam were personally obtained by me. Portions of this information were initially documented by [facility] and reviewed by me for thoroughness and accuracy.     Paulene Floor  [facility] [phone] (phone) [phone] (fax)  [place]

## 2020-04-15 LAB — COMPREHENSIVE METABOLIC PANEL
ALT: 13 IU/L (ref 0–32)
AST: 12 IU/L (ref 0–40)
Albumin/Globulin Ratio: 1.4 (ref 1.2–2.2)
Albumin: 4.2 g/dL (ref 3.8–4.8)
Alkaline Phosphatase: 84 IU/L (ref 44–121)
BUN/Creatinine Ratio: 18 (ref 12–28)
BUN: 9 mg/dL (ref 8–27)
Bilirubin Total: 0.3 mg/dL (ref 0.0–1.2)
CO2: 23 mmol/L (ref 20–29)
Calcium: 9.1 mg/dL (ref 8.7–10.3)
Chloride: 103 mmol/L (ref 96–106)
Creatinine, Ser: 0.5 mg/dL — ABNORMAL LOW (ref 0.57–1.00)
GFR calc Af Amer: 117 mL/min/{1.73_m2} (ref >59)
GFR calc non Af Amer: 102 mL/min/{1.73_m2} (ref >59)
Globulin, Total: 2.9 g/dL (ref 1.5–4.5)
Glucose: 151 mg/dL — ABNORMAL HIGH (ref 65–99)
Potassium: 4 mmol/L (ref 3.5–5.2)
Sodium: 142 mmol/L (ref 134–144)
Total Protein: 7.1 g/dL (ref 6.0–8.5)

## 2020-04-15 LAB — LIPID PANEL
Chol/HDL Ratio: 3.4 ratio (ref 0.0–4.4)
Cholesterol, Total: 128 mg/dL (ref 100–199)
HDL: 38 mg/dL — ABNORMAL LOW (ref >39)
LDL Chol Calc (NIH): 75 mg/dL (ref 0–99)
Triglycerides: 73 mg/dL (ref 0–149)
VLDL Cholesterol Cal: 15 mg/dL (ref 5–40)

## 2020-04-28 LAB — HM DIABETES EYE EXAM

## 2020-05-26 ENCOUNTER — Encounter: Payer: Self-pay | Admitting: Obstetrics and Gynecology

## 2020-05-26 ENCOUNTER — Other Ambulatory Visit (HOSPITAL_COMMUNITY)
Admission: RE | Admit: 2020-05-26 | Discharge: 2020-05-26 | Disposition: A | Discharge status: Home / Self Care | Payer: Medicare Other | Source: Ambulatory Visit | Attending: Obstetrics and Gynecology | Admitting: Obstetrics and Gynecology

## 2020-05-26 ENCOUNTER — Other Ambulatory Visit: Payer: Self-pay

## 2020-05-26 ENCOUNTER — Ambulatory Visit (INDEPENDENT_AMBULATORY_CARE_PROVIDER_SITE_OTHER): Payer: Medicare Other | Admitting: Obstetrics and Gynecology

## 2020-05-26 VITALS — BP 141/84 | HR 103 | Wt 232.6 lb

## 2020-05-26 DIAGNOSIS — Z124 Encounter for screening for malignant neoplasm of cervix: Secondary | ICD-10-CM

## 2020-05-26 DIAGNOSIS — B977 Papillomavirus as the cause of diseases classified elsewhere: Secondary | ICD-10-CM | POA: Insufficient documentation

## 2020-05-26 DIAGNOSIS — N72 Inflammatory disease of cervix uteri: Secondary | ICD-10-CM

## 2020-05-26 DIAGNOSIS — Z1151 Encounter for screening for human papillomavirus (HPV): Secondary | ICD-10-CM | POA: Insufficient documentation

## 2020-05-26 DIAGNOSIS — N870 Mild cervical dysplasia: Secondary | ICD-10-CM | POA: Insufficient documentation

## 2020-05-26 DIAGNOSIS — Z9889 Other specified postprocedural states: Secondary | ICD-10-CM

## 2020-05-26 NOTE — Progress Notes (Signed)
HPI:      [name] is a 65 y.o. G2P1011 who LMP was No LMP recorded. Patient is postmenopausal.  Subjective:   She presents today approximately 1 year from LEEP for CIN.  She is here today for a follow-up Pap.  Patient had COVID approximately a year and a half ago.  She has lost her smell and taste and has not completely recovered it.  She is fully vaccinated.    Hx: The following portions of the patient's history were reviewed and updated as appropriate:             She  has a past medical history of Diabetes mellitus without complication ([place]), H/O multiple trauma, Hyperlipidemia, Hypertension, Low back pain, Lung collapse, Shoulder pain, and Thyroid disease. She does not have any pertinent problems on file. She  has a past surgical history that includes Thyroid surgery; Bladder surgery; Cesarean section; Tooth extraction (2016); and Colonoscopy with propofol (N/A, 04/20/2017). Her family history includes Aneurysm in her father; Breast cancer in her sister; Diabetes in her mother and sister; Heart attack in her mother; Stroke in her mother. She  reports that she has quit smoking. Her smoking use included cigarettes. She has never used smokeless tobacco. She reports that she does not drink alcohol and does not use drugs. She has a current medication list which includes the following prescription(s): amlodipine, aspirin, atorvastatin, calcium-vitamin d, carvedilol, cholecalciferol, empagliflozin, glipizide, onetouch verio, meloxicam, metformin, olopatadine, quinapril, and linagliptin. She is allergic to mevacor [lovastatin].       Review of Systems:  Review of Systems  Constitutional: Denied constitutional symptoms, night sweats, recent illness, fatigue, fever, insomnia and weight loss.  Eyes: Denied eye symptoms, eye pain, photophobia, vision change and visual disturbance.  Ears/Nose/Throat/Neck: Denied ear, nose, throat or neck symptoms, hearing loss, nasal discharge, sinus  congestion and sore throat.  Cardiovascular: Denied cardiovascular symptoms, arrhythmia, chest pain/pressure, edema, exercise intolerance, orthopnea and palpitations.  Respiratory: Denied pulmonary symptoms, asthma, pleuritic pain, productive sputum, cough, dyspnea and wheezing.  Gastrointestinal: Denied, gastro-esophageal reflux, melena, nausea and vomiting.  Genitourinary: Denied genitourinary symptoms including symptomatic vaginal discharge, pelvic relaxation issues, and urinary complaints.  Musculoskeletal: Denied musculoskeletal symptoms, stiffness, swelling, muscle weakness and myalgia.  Dermatologic: Denied dermatology symptoms, rash and scar.  Neurologic: Denied neurology symptoms, dizziness, headache, neck pain and syncope.  Psychiatric: Denied psychiatric symptoms, anxiety and depression.  Endocrine: Denied endocrine symptoms including hot flashes and night sweats.   Meds:   Current Outpatient Medications on File Prior to Visit  Medication Sig Dispense Refill  . amLODipine (NORVASC) 5 MG tablet Take 1 tablet (5 mg total) by mouth daily. 90 tablet 1  . aspirin 81 MG tablet Take 81 mg by mouth daily.    Marland Kitchen atorvastatin (LIPITOR) 20 MG tablet Take 1 tablet by mouth once daily 90 tablet 3  . CALCIUM-VITAMIN D PO Take 800 mg by mouth daily.    . carvedilol (COREG) 6.25 MG tablet Take 1 tablet by mouth twice daily 180 tablet 1  . cholecalciferol (VITAMIN D) 1000 UNITS tablet Take 1,000 Units by mouth daily.    . empagliflozin (JARDIANCE) 25 MG TABS tablet Take 1 tablet (25 mg total) by mouth daily before breakfast. 90 tablet 1  . glipiZIDE (GLUCOTROL) 10 MG tablet TAKE 1 TABLET BY MOUTH TWICE DAILY 30 MINUTES BEFORE A MEAL 180 tablet 1  . glucose blood (ONETOUCH VERIO) test strip Use as instructed 100 each 12  . meloxicam (MOBIC) 7.5 MG  tablet Take 1 tablet by mouth once daily 30 tablet 0  . metFORMIN (GLUCOPHAGE) 1000 MG tablet Take 1 tablet by mouth twice daily 180 tablet 1  .  olopatadine (PATANOL) 0.1 % ophthalmic solution INSTILL 1 DROP INTO EACH EYE TWICE DAILY  3  . quinapril (ACCUPRIL) 20 MG tablet TAKE 1 TABLET BY MOUTH AT BEDTIME 90 tablet 1  . linagliptin (TRADJENTA) 5 MG TABS tablet Take 1 tablet (5 mg total) by mouth daily. 90 tablet 1   No current facility-administered medications on file prior to visit.          Objective:     Vitals:   05/26/20 0946  BP: (!) 141/84  Pulse: (!) 103   Filed Weights   05/26/20 0946  Weight: 232 lb 9.6 oz (105.5 kg)              Physical examination   Pelvic:   Vulva: Normal appearance.  No lesions.  Vagina: No lesions or abnormalities noted.  Support: Normal pelvic support.  Urethra No masses tenderness or scarring.  Meatus Normal size without lesions or prolapse.  Cervix: Normal appearance.  No lesions.  Obviously status post procedure-cervical stenosis present  Anus: Normal exam.  No lesions.  Perineum: Normal exam.  No lesions.     Assessment:    G2P1011 Patient Active Problem List   Diagnosis Date Noted  . Type 2 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, without long-term current use of insulin ([place]) 04/23/2019  . Morbid obesity ([place]) 12/19/2018  . Mild nonproliferative diabetic retinopathy ([place]) 08/07/2018  . Diabetes mellitus without complication ([place]) 62/95/2841  . GERD (gastroesophageal reflux disease) 08/14/2014  . Hyperlipidemia   . Hypertension   . Lumbar radiculitis 06/19/2012     1. CIN I (cervical intraepithelial neoplasia I)   2. High risk human papilloma virus (HPV) infection of cervix   3. History of loop electrical excision procedure (LEEP)        Plan:            1.  Pap smear today.  If this is normal repeat in 6 months.  If that one is normal no further Pap smears are necessary.  (Expect lack of endocervical cells because of severe cervical stenosis status post procedure.) Orders No orders of the defined types were placed in  this encounter.   No orders of the defined types were placed in this encounter.     F/U  Return in about 6 months (around 11/26/2020) for Annual Physical. I spent 20 minutes involved in the care of this patient preparing to see the patient by obtaining and reviewing her medical history (including labs, imaging tests and prior procedures), documenting clinical information in the electronic health record (EHR), counseling and coordinating care plans, writing and sending prescriptions, ordering tests or procedures and directly communicating with the patient by discussing pertinent items from her history and physical exam as well as detailing my assessment and plan as noted above so that she has an informed understanding.  All of her questions were answered.  Finis Bud, M.D. 05/26/2020 10:10 AM

## 2020-05-26 NOTE — Addendum Note (Signed)
Addended by: Durwin Glaze on: 05/26/2020 10:20 AM   Modules accepted: Orders

## 2020-05-27 LAB — CYTOLOGY - PAP
Comment: NEGATIVE
Diagnosis: NEGATIVE
High risk HPV: NEGATIVE

## 2020-05-31 ENCOUNTER — Telehealth: Payer: Self-pay | Admitting: Physician Assistant

## 2020-05-31 DIAGNOSIS — D126 Benign neoplasm of colon, unspecified: Secondary | ICD-10-CM

## 2020-05-31 NOTE — Telephone Encounter (Signed)
Patient is returning a call she said regarding an appt. For a colonoscopy.  She said someone called her from the office to set the appt. Up.  Please return the call to discuss at [phone]

## 2020-06-04 NOTE — Telephone Encounter (Signed)
Ok to place referral.

## 2020-06-04 NOTE — Telephone Encounter (Signed)
There is not an order in for a colonoscopy so I do not think it was [facility] that called but pt has asked that she be referred to [facility] . She states she is due for colonoscopy this year,Thanks

## 2020-06-04 NOTE — Telephone Encounter (Signed)
Please advise. Patient is due for colonoscopy.

## 2020-06-23 ENCOUNTER — Encounter: Payer: Self-pay | Admitting: *Deleted

## 2020-08-12 ENCOUNTER — Ambulatory Visit (INDEPENDENT_AMBULATORY_CARE_PROVIDER_SITE_OTHER): Payer: Medicare Other | Admitting: Family Medicine

## 2020-08-12 ENCOUNTER — Other Ambulatory Visit: Payer: Self-pay

## 2020-08-12 ENCOUNTER — Ambulatory Visit: Payer: Medicare Other | Admitting: Physician Assistant

## 2020-08-12 ENCOUNTER — Encounter: Payer: Self-pay | Admitting: Family Medicine

## 2020-08-12 VITALS — BP 131/71 | HR 85 | Temp 97.5°F | Resp 16 | Ht 62.0 in | Wt 232.0 lb

## 2020-08-12 DIAGNOSIS — E782 Mixed hyperlipidemia: Secondary | ICD-10-CM | POA: Diagnosis not present

## 2020-08-12 DIAGNOSIS — E113299 Type 2 diabetes mellitus with mild nonproliferative diabetic retinopathy without macular edema, unspecified eye: Secondary | ICD-10-CM | POA: Diagnosis not present

## 2020-08-12 DIAGNOSIS — I10 Essential (primary) hypertension: Secondary | ICD-10-CM

## 2020-08-12 DIAGNOSIS — Z1211 Encounter for screening for malignant neoplasm of colon: Secondary | ICD-10-CM | POA: Diagnosis not present

## 2020-08-12 DIAGNOSIS — M25562 Pain in left knee: Secondary | ICD-10-CM

## 2020-08-12 LAB — POCT GLYCOSYLATED HEMOGLOBIN (HGB A1C)
Est. average glucose Bld gHb Est-mCnc: 166
Hemoglobin A1C: 7.4 % — AB (ref 4.0–5.6)

## 2020-08-12 MED ORDER — LINAGLIPTIN 5 MG PO TABS
5.0000 mg | ORAL_TABLET | Freq: Every day | ORAL | 1 refills | Status: DC
Start: 2020-08-12 — End: 2021-03-30

## 2020-08-12 MED ORDER — QUINAPRIL HCL 20 MG PO TABS: 20.0000 mg | ORAL_TABLET | Freq: Every day | ORAL | 1 refills | Status: DC

## 2020-08-12 MED ORDER — MELOXICAM 7.5 MG PO TABS: 7.5000 mg | ORAL_TABLET | Freq: Every day | ORAL | 0 refills | Status: DC

## 2020-08-12 NOTE — Progress Notes (Signed)
Established patient visit   Patient: Chelsea Gomez   DOB: Jul 31, 1955   65 y.o. Female  MRN: [id number] Visit Date: 08/12/2020  Today's healthcare provider: Vernie Murders, PA-C   Chief Complaint  Patient presents with   Diabetes   Hyperlipidemia   Hypertension   Subjective    Diabetes Pertinent negatives for diabetes include no chest pain, no fatigue, no polydipsia, no polyphagia and no polyuria.  Hyperlipidemia Pertinent negatives include no chest pain or shortness of breath.  Hypertension Pertinent negatives include no chest pain, palpitations or shortness of breath.   Diabetes Mellitus Type II, follow-up  Lab Results  Component Value Date   HGBA1C 7.4 (A) 08/12/2020   HGBA1C 7.6 (A) 04/14/2020   HGBA1C 7.1 (A) 12/17/2019   Last seen for diabetes 4 months ago.  Management since then includes continuing the same treatment. She reports excellent compliance with treatment. She is not having side effects.  Home blood sugar records: fasting range: 140-160's  Episodes of hypoglycemia? No   Current insulin regiment: none Most Recent Eye Exam: UTD  --------------------------------------------------------------------------------------------------- Hypertension, follow-up  BP Readings from Last 3 Encounters:  08/12/20 131/71  05/26/20 (!) 141/84  04/14/20 137/66   Wt Readings from Last 3 Encounters:  08/12/20 232 lb (105.2 kg)  05/26/20 232 lb 9.6 oz (105.5 kg)  04/14/20 233 lb 1.6 oz (105.7 kg)     She was last seen for hypertension 4 months ago.  BP at that visit was 137/66. Management since that visit includes no changes. She reports excellent compliance with treatment. She is not having side effects. She is not exercising. She is adherent to low salt diet.   Outside blood pressures are not being checked.  She does not smoke.  Use of agents associated with hypertension: none.    --------------------------------------------------------------------------------------------------- Lipid/Cholesterol, follow-up  Last Lipid Panel: Lab Results  Component Value Date   CHOL 128 04/14/2020   LDLCALC 75 04/14/2020   HDL 38 (L) 04/14/2020   TRIG 73 04/14/2020    She was last seen for this 4 months ago.  Management since that visit includes no changes.  She reports excellent compliance with treatment. She is not having side effects.  Symptoms: No appetite changes No foot ulcerations  No chest pain No chest pressure/discomfort  No dyspnea No orthopnea  No fatigue No lower extremity edema  No palpitations No paroxysmal nocturnal dyspnea  No nausea No numbness or tingling of extremity  No polydipsia No polyuria  No speech difficulty No syncope   She is following a Regular diet. Current exercise: none  Last metabolic panel Lab Results  Component Value Date   GLUCOSE 151 (H) 04/14/2020   NA 142 04/14/2020   K 4.0 04/14/2020   BUN 9 04/14/2020   CREATININE 0.50 (L) 04/14/2020   GFRNONAA 102 04/14/2020   GFRAA 117 04/14/2020   CALCIUM 9.1 04/14/2020   AST 12 04/14/2020   ALT 13 04/14/2020   The ASCVD Risk score (Goff DC Jr., et al., 2013) failed to calculate for the following reasons:   The valid total cholesterol range is 130 to 320 mg/dL  ---------------------------------------------------------------------------------------------------   Patient Active Problem List   Diagnosis Date Noted   Type 2 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, without long-term current use of insulin ([place]) 04/23/2019   Morbid obesity ([place]) 12/19/2018   Mild nonproliferative diabetic retinopathy ([place]) 08/07/2018   Diabetes mellitus without complication ([place]) 94/49/6759   GERD (gastroesophageal reflux disease)  08/14/2014   Hyperlipidemia    Hypertension    Lumbar radiculitis 06/19/2012   Social History   Tobacco Use   Smoking  status: Former    Pack years: 0.00    Types: Cigarettes   Smokeless tobacco: Never  Vaping Use   Vaping Use: Never used  Substance Use Topics   Alcohol use: No   Drug use: No   Allergies  Allergen Reactions   Mevacor [Lovastatin] Other (See Comments)    Muscle cramps       Medications: Outpatient Medications Prior to Visit  Medication Sig   aspirin 81 MG tablet Take 81 mg by mouth daily.   atorvastatin (LIPITOR) 20 MG tablet Take 1 tablet by mouth once daily   CALCIUM-VITAMIN D PO Take 800 mg by mouth daily.   carvedilol (COREG) 6.25 MG tablet Take 1 tablet by mouth twice daily   cholecalciferol (VITAMIN D) 1000 UNITS tablet Take 1,000 Units by mouth daily.   empagliflozin (JARDIANCE) 25 MG TABS tablet Take 1 tablet (25 mg total) by mouth daily before breakfast.   glipiZIDE (GLUCOTROL) 10 MG tablet TAKE 1 TABLET BY MOUTH TWICE DAILY 30 MINUTES BEFORE A MEAL   glucose blood (ONETOUCH VERIO) test strip Use as instructed   meloxicam (MOBIC) 7.5 MG tablet Take 1 tablet by mouth once daily   metFORMIN (GLUCOPHAGE) 1000 MG tablet Take 1 tablet by mouth twice daily   olopatadine (PATANOL) 0.1 % ophthalmic solution INSTILL 1 DROP INTO EACH EYE TWICE DAILY   quinapril (ACCUPRIL) 20 MG tablet TAKE 1 TABLET BY MOUTH AT BEDTIME   amLODipine (NORVASC) 5 MG tablet Take 1 tablet (5 mg total) by mouth daily. (Patient not taking: Reported on 08/12/2020)   linagliptin (TRADJENTA) 5 MG TABS tablet Take 1 tablet (5 mg total) by mouth daily.   No facility-administered medications prior to visit.    Review of Systems  Constitutional:  Negative for activity change, appetite change and fatigue.  Eyes:  Negative for visual disturbance.  Respiratory:  Negative for chest tightness and shortness of breath.   Cardiovascular:  Negative for chest pain and palpitations.  Gastrointestinal:  Negative for abdominal pain, nausea and vomiting.  Endocrine: Negative for polydipsia, polyphagia and polyuria.        Objective    BP 131/71 (BP Location: Right Arm, Patient Position: Sitting, Cuff Size: Large)   Pulse 85   Temp (!) 97.5 F (36.4 C) (Oral)   Resp 16   Ht 5\' 2"  (1.575 m)   Wt 232 lb (105.2 kg)   SpO2 98%   BMI 42.43 kg/m  BP Readings from Last 3 Encounters:  08/12/20 131/71  05/26/20 (!) 141/84  04/14/20 137/66   Wt Readings from Last 3 Encounters:  08/12/20 232 lb (105.2 kg)  05/26/20 232 lb 9.6 oz (105.5 kg)  04/14/20 233 lb 1.6 oz (105.7 kg)       Physical Exam Constitutional:      General: She is not in acute distress.    Appearance: She is well-developed.  HENT:     Head: Normocephalic and atraumatic.     Right Ear: Hearing and tympanic membrane normal.     Left Ear: Hearing and tympanic membrane normal.     Nose: Nose normal.  Eyes:     General: Lids are normal. No scleral icterus.       Right eye: No discharge.        Left eye: No discharge.     Conjunctiva/sclera: Conjunctivae normal.  Neck:     Vascular: No carotid bruit.  Cardiovascular:     Rate and Rhythm: Normal rate and regular rhythm.     Heart sounds: Normal heart sounds.  Pulmonary:     Effort: Pulmonary effort is normal. No respiratory distress.     Breath sounds: Normal breath sounds.  Abdominal:     General: Bowel sounds are normal.     Palpations: Abdomen is soft.  Musculoskeletal:        General: Normal range of motion.     Cervical back: Neck supple.  Skin:    Findings: No lesion or rash.  Neurological:     Mental Status: She is alert and oriented to person, place, and time.  Psychiatric:        Speech: Speech normal.        Behavior: Behavior normal.        Thought Content: Thought content normal.      Results for orders placed or performed in visit on 08/12/20  POCT glycosylated hemoglobin (Hb A1C)  Result Value Ref Range   Hemoglobin A1C 7.4 (A) 4.0 - 5.6 %   Est. average glucose Bld gHb Est-mCnc 166     Assessment & Plan     1. Type 2 diabetes mellitus with mild  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, without long-term current use of insulin, unspecified laterality (HCC) Normal foot exam out neuropathies. Hgb A1C was 7.4% today. Will continue Tradjenta, Glipizide and Metformin. Recheck labs and follow up pending reports. - POCT glycosylated hemoglobin (Hb A1C) - linagliptin (TRADJENTA) 5 MG TABS tablet; Take 1 tablet (5 mg total) by mouth daily.  Dispense: 90 tablet; Refill: 1 - CBC with Differential/Platelet - Comprehensive metabolic panel - Lipid panel - TSH  2. Mixed hyperlipidemia Tolerating the Atorvastatin 20 mg qd. Recheck labs. - CBC with Differential/Platelet - Comprehensive metabolic panel - Lipid panel - TSH  3. Morbid obesity ([place]) Work on low fat weight loss diet and recheck labs for other metabolic disorders. - CBC with Differential/Platelet - Comprehensive metabolic panel - Lipid panel - TSH  4. Colon cancer screening - Ambulatory referral to Gastroenterology  5. Acute pain of left knee History of mild arthritis on x-ray last Fall. Controlled with use of Meloxicam prn. - meloxicam (MOBIC) 7.5 MG tablet; Take 1 tablet (7.5 mg total) by mouth daily.  Dispense: 30 tablet; Refill: 0 - CBC with Differential/Platelet  6. Essential hypertension Continues to take the Coreg and Quinapril. Recheck labs. - quinapril (ACCUPRIL) 20 MG tablet; Take 1 tablet (20 mg total) by mouth at bedtime.  Dispense: 90 tablet; Refill: 1 - CBC with Differential/Platelet - Comprehensive metabolic panel - Lipid panel - TSH   Return in about 4 months (around 12/12/2020) for chronic disease f/u, CPE.      I, Aneesh Faller, PA-C, have reviewed all documentation for this visit. The documentation on 08/12/20 for the exam, diagnosis, procedures, and orders are all accurate and complete.    Vernie Murders, PA-C  [employer] [phone] (phone) [phone] (fax)  [place]

## 2020-08-12 NOTE — Patient Instructions (Signed)
Diabetes Mellitus and Nutrition, Adult When you have diabetes, or diabetes mellitus, it is very important to have healthy eating habits because your blood sugar (glucose) levels are greatly affected by what you eat and drink. Eating healthy foods in the right amounts, at about the same times every day, can help you:  Control your blood glucose.  Lower your risk of heart disease.  Improve your blood pressure.  Reach or maintain a healthy weight. What can affect my meal plan? Every person with diabetes is different, and each person has different needs for a meal plan. Your health care provider may recommend that you work with a dietitian to make a meal plan that is best for you. Your meal plan may vary depending on factors such as:  The calories you need.  The medicines you take.  Your weight.  Your blood glucose, blood pressure, and cholesterol levels.  Your activity level.  Other health conditions you have, such as heart or kidney disease. How do carbohydrates affect me? Carbohydrates, also called carbs, affect your blood glucose level more than any other type of food. Eating carbs naturally raises the amount of glucose in your blood. Carb counting is a method for keeping track of how many carbs you eat. Counting carbs is important to keep your blood glucose at a healthy level, especially if you use insulin or take certain oral diabetes medicines. It is important to know how many carbs you can safely have in each meal. This is different for every person. Your dietitian can help you calculate how many carbs you should have at each meal and for each snack. How does alcohol affect me? Alcohol can cause a sudden decrease in blood glucose (hypoglycemia), especially if you use insulin or take certain oral diabetes medicines. Hypoglycemia can be a life-threatening condition. Symptoms of hypoglycemia, such as sleepiness, dizziness, and confusion, are similar to symptoms of having too much  alcohol.  Do not drink alcohol if: ? Your health care provider tells you not to drink. ? You are pregnant, may be pregnant, or are planning to become pregnant.  If you drink alcohol: ? Do not drink on an empty stomach. ? Limit how much you use to:  0-1 drink a day for women.  0-2 drinks a day for men. ? Be aware of how much alcohol is in your drink. In the [place] one drink equals one 12 oz bottle of beer (355 mL), one 5 oz glass of wine (148 mL), or one 1 oz glass of hard liquor (44 mL). ? Keep yourself hydrated with water, diet soda, or unsweetened iced tea.  Keep in mind that regular soda, juice, and other mixers may contain a lot of sugar and must be counted as carbs. What are tips for following this plan? Reading food labels  Start by checking the serving size on the "Nutrition Facts" label of packaged foods and drinks. The amount of calories, carbs, fats, and other nutrients listed on the label is based on one serving of the item. Many items contain more than one serving per package.  Check the total grams (g) of carbs in one serving. You can calculate the number of servings of carbs in one serving by dividing the total carbs by 15. For example, if a food has 30 g of total carbs per serving, it would be equal to 2 servings of carbs.  Check the number of grams (g) of saturated fats and trans fats in one serving. Choose foods that have   a low amount or none of these fats.  Check the number of milligrams (mg) of salt (sodium) in one serving. Most people should limit total sodium intake to less than 2,300 mg per day.  Always check the nutrition information of foods labeled as "low-fat" or "nonfat." These foods may be higher in added sugar or refined carbs and should be avoided.  Talk to your dietitian to identify your daily goals for nutrients listed on the label. Shopping  Avoid buying canned, pre-made, or processed foods. These foods tend to be high in fat, sodium, and added  sugar.  Shop around the outside edge of the grocery store. This is where you will most often find fresh fruits and vegetables, bulk grains, fresh meats, and fresh dairy. Cooking  Use low-heat cooking methods, such as baking, instead of high-heat cooking methods like deep frying.  Cook using healthy oils, such as olive, canola, or sunflower oil.  Avoid cooking with butter, cream, or high-fat meats. Meal planning  Eat meals and snacks regularly, preferably at the same times every day. Avoid going long periods of time without eating.  Eat foods that are high in fiber, such as fresh fruits, vegetables, beans, and whole grains. Talk with your dietitian about how many servings of carbs you can eat at each meal.  Eat 4-6 oz (112-168 g) of lean protein each day, such as lean meat, chicken, fish, eggs, or tofu. One ounce (oz) of lean protein is equal to: ? 1 oz (28 g) of meat, chicken, or fish. ? 1 egg. ?  cup (62 g) of tofu.  Eat some foods each day that contain healthy fats, such as avocado, nuts, seeds, and fish.   What foods should I eat? Fruits Berries. Apples. Oranges. Peaches. Apricots. Plums. Grapes. Mango. Papaya. Pomegranate. Kiwi. Cherries. Vegetables Lettuce. Spinach. Leafy greens, including kale, chard, collard greens, and mustard greens. Beets. Cauliflower. Cabbage. Broccoli. Carrots. Green beans. Tomatoes. Peppers. Onions. Cucumbers. Brussels sprouts. Grains Whole grains, such as whole-wheat or whole-grain bread, crackers, tortillas, cereal, and pasta. Unsweetened oatmeal. Quinoa. Brown or wild rice. Meats and other proteins Seafood. Poultry without skin. Lean cuts of poultry and beef. Tofu. Nuts. Seeds. Dairy Low-fat or fat-free dairy products such as milk, yogurt, and cheese. The items listed above may not be a complete list of foods and beverages you can eat. Contact a dietitian for more information. What foods should I avoid? Fruits Fruits canned with  syrup. Vegetables Canned vegetables. Frozen vegetables with butter or cream sauce. Grains Refined white flour and flour products such as bread, pasta, snack foods, and cereals. Avoid all processed foods. Meats and other proteins Fatty cuts of meat. Poultry with skin. Breaded or fried meats. Processed meat. Avoid saturated fats. Dairy Full-fat yogurt, cheese, or milk. Beverages Sweetened drinks, such as soda or iced tea. The items listed above may not be a complete list of foods and beverages you should avoid. Contact a dietitian for more information. Questions to ask a health care provider  Do I need to meet with a diabetes educator?  Do I need to meet with a dietitian?  What number can I call if I have questions?  When are the best times to check my blood glucose? Where to find more information:  American Diabetes Association: diabetes.org  Academy of Nutrition and Dietetics: www.eatright.org  National Institute of Diabetes and Digestive and Kidney Diseases: www.niddk.nih.gov  Association of Diabetes Care and Education Specialists: www.diabeteseducator.org Summary  It is important to have healthy eating   habits because your blood sugar (glucose) levels are greatly affected by what you eat and drink.  A healthy meal plan will help you control your blood glucose and maintain a healthy lifestyle.  Your health care provider may recommend that you work with a dietitian to make a meal plan that is best for you.  Keep in mind that carbohydrates (carbs) and alcohol have immediate effects on your blood glucose levels. It is important to count carbs and to use alcohol carefully. This information is not intended to replace advice given to you by your health care provider. Make sure you discuss any questions you have with your health care provider. Document Revised: 01/28/2019 Document Reviewed: 01/28/2019 Elsevier Patient Education  2021 Elsevier Inc.  

## 2020-08-16 ENCOUNTER — Encounter: Payer: Self-pay | Admitting: Gastroenterology

## 2020-08-16 ENCOUNTER — Other Ambulatory Visit: Payer: Self-pay

## 2020-08-16 DIAGNOSIS — Z86010 Personal history of colonic polyps: Secondary | ICD-10-CM

## 2020-08-16 MED ORDER — SUPREP BOWEL PREP KIT 17.5-3.13-1.6 GM/177ML PO SOLN: 1.0000 | ORAL | 0 refills | Status: DC

## 2020-08-26 LAB — CBC WITH DIFFERENTIAL/PLATELET
Basophils Absolute: 0.1 10*3/uL (ref 0.0–0.2)
Basos: 1 %
EOS (ABSOLUTE): 0.2 10*3/uL (ref 0.0–0.4)
Eos: 2 %
Hematocrit: 43.8 % (ref 34.0–46.6)
Hemoglobin: 14.1 g/dL (ref 11.1–15.9)
Immature Grans (Abs): 0 10*3/uL (ref 0.0–0.1)
Immature Granulocytes: 0 %
Lymphocytes Absolute: 3.4 10*3/uL — ABNORMAL HIGH (ref 0.7–3.1)
Lymphs: 44 %
MCH: 28.3 pg (ref 26.6–33.0)
MCHC: 32.2 g/dL (ref 31.5–35.7)
MCV: 88 fL (ref 79–97)
Monocytes Absolute: 0.7 10*3/uL (ref 0.1–0.9)
Monocytes: 9 %
Neutrophils Absolute: 3.5 10*3/uL (ref 1.4–7.0)
Neutrophils: 44 %
Platelets: 316 10*3/uL (ref 150–450)
RBC: 4.98 x10E6/uL (ref 3.77–5.28)
RDW: 11.8 % (ref 11.7–15.4)
WBC: 7.8 10*3/uL (ref 3.4–10.8)

## 2020-08-26 LAB — LIPID PANEL
Chol/HDL Ratio: 3.5 ratio (ref 0.0–4.4)
Cholesterol, Total: 144 mg/dL (ref 100–199)
HDL: 41 mg/dL (ref >39)
LDL Chol Calc (NIH): 84 mg/dL (ref 0–99)
Triglycerides: 105 mg/dL (ref 0–149)
VLDL Cholesterol Cal: 19 mg/dL (ref 5–40)

## 2020-08-26 LAB — COMPREHENSIVE METABOLIC PANEL
ALT: 12 IU/L (ref 0–32)
AST: 14 IU/L (ref 0–40)
Albumin/Globulin Ratio: 1.3 (ref 1.2–2.2)
Albumin: 4.2 g/dL (ref 3.8–4.8)
Alkaline Phosphatase: 85 IU/L (ref 44–121)
BUN/Creatinine Ratio: 25 (ref 12–28)
BUN: 12 mg/dL (ref 8–27)
Bilirubin Total: 0.3 mg/dL (ref 0.0–1.2)
CO2: 26 mmol/L (ref 20–29)
Calcium: 9.5 mg/dL (ref 8.7–10.3)
Chloride: 102 mmol/L (ref 96–106)
Creatinine, Ser: 0.48 mg/dL — ABNORMAL LOW (ref 0.57–1.00)
Globulin, Total: 3.3 g/dL (ref 1.5–4.5)
Glucose: 163 mg/dL — ABNORMAL HIGH (ref 65–99)
Potassium: 4.1 mmol/L (ref 3.5–5.2)
Sodium: 142 mmol/L (ref 134–144)
Total Protein: 7.5 g/dL (ref 6.0–8.5)
eGFR: 104 mL/min/{1.73_m2} (ref >59)

## 2020-08-26 LAB — TSH: TSH: 2.05 u[IU]/mL (ref 0.450–4.500)

## 2020-08-30 ENCOUNTER — Ambulatory Visit: Payer: Medicare Other | Admitting: Anesthesiology

## 2020-08-30 ENCOUNTER — Encounter
Admission: RE | Disposition: A | Discharge status: Home / Self Care | Payer: Self-pay | Source: Home / Self Care | Attending: Gastroenterology

## 2020-08-30 ENCOUNTER — Other Ambulatory Visit: Payer: Self-pay

## 2020-08-30 ENCOUNTER — Encounter: Payer: Self-pay | Admitting: Gastroenterology

## 2020-08-30 ENCOUNTER — Ambulatory Visit
Admission: RE | Admit: 2020-08-30 | Discharge: 2020-08-30 | Disposition: A | Discharge status: Home / Self Care | Payer: Medicare Other | Attending: Gastroenterology | Admitting: Gastroenterology

## 2020-08-30 DIAGNOSIS — Z833 Family history of diabetes mellitus: Secondary | ICD-10-CM | POA: Diagnosis not present

## 2020-08-30 DIAGNOSIS — Z7982 Long term (current) use of aspirin: Secondary | ICD-10-CM | POA: Insufficient documentation

## 2020-08-30 DIAGNOSIS — Z8249 Family history of ischemic heart disease and other diseases of the circulatory system: Secondary | ICD-10-CM | POA: Diagnosis not present

## 2020-08-30 DIAGNOSIS — Z803 Family history of malignant neoplasm of breast: Secondary | ICD-10-CM | POA: Insufficient documentation

## 2020-08-30 DIAGNOSIS — Z888 Allergy status to other drugs, medicaments and biological substances status: Secondary | ICD-10-CM | POA: Insufficient documentation

## 2020-08-30 DIAGNOSIS — Z79899 Other long term (current) drug therapy: Secondary | ICD-10-CM | POA: Diagnosis not present

## 2020-08-30 DIAGNOSIS — Z860100 Personal history of colon polyps, unspecified: Secondary | ICD-10-CM

## 2020-08-30 DIAGNOSIS — K5730 Diverticulosis of large intestine without perforation or abscess without bleeding: Secondary | ICD-10-CM | POA: Diagnosis not present

## 2020-08-30 DIAGNOSIS — Z791 Long term (current) use of non-steroidal anti-inflammatories (NSAID): Secondary | ICD-10-CM | POA: Insufficient documentation

## 2020-08-30 DIAGNOSIS — Z1211 Encounter for screening for malignant neoplasm of colon: Secondary | ICD-10-CM | POA: Insufficient documentation

## 2020-08-30 DIAGNOSIS — D120 Benign neoplasm of cecum: Secondary | ICD-10-CM

## 2020-08-30 DIAGNOSIS — E119 Type 2 diabetes mellitus without complications: Secondary | ICD-10-CM | POA: Diagnosis not present

## 2020-08-30 DIAGNOSIS — K640 First degree hemorrhoids: Secondary | ICD-10-CM | POA: Diagnosis not present

## 2020-08-30 DIAGNOSIS — Z87891 Personal history of nicotine dependence: Secondary | ICD-10-CM | POA: Insufficient documentation

## 2020-08-30 DIAGNOSIS — Z7984 Long term (current) use of oral hypoglycemic drugs: Secondary | ICD-10-CM | POA: Diagnosis not present

## 2020-08-30 HISTORY — DX: Unspecified osteoarthritis, unspecified site: M19.90

## 2020-08-30 HISTORY — PX: POLYPECTOMY: SHX5525

## 2020-08-30 HISTORY — DX: Gastro-esophageal reflux disease without esophagitis: K21.9

## 2020-08-30 HISTORY — DX: Presence of dental prosthetic device (complete) (partial): Z97.2

## 2020-08-30 HISTORY — PX: COLONOSCOPY WITH PROPOFOL: SHX5780

## 2020-08-30 LAB — GLUCOSE, CAPILLARY
Glucose-Capillary: 173 mg/dL — ABNORMAL HIGH (ref 70–99)
Glucose-Capillary: 176 mg/dL — ABNORMAL HIGH (ref 70–99)

## 2020-08-30 SURGERY — COLONOSCOPY WITH PROPOFOL
Anesthesia: General | Site: Rectum

## 2020-08-30 MED ORDER — LIDOCAINE HCL (CARDIAC) PF 100 MG/5ML IV SOSY
PREFILLED_SYRINGE | INTRAVENOUS | Status: DC | PRN
  Administered 2020-08-30: 30 mg via INTRAVENOUS

## 2020-08-30 MED ORDER — ACETAMINOPHEN 160 MG/5ML PO SOLN: 975.0000 mg | Freq: Once | ORAL | Status: DC | PRN

## 2020-08-30 MED ORDER — SODIUM CHLORIDE 0.9 % IV SOLN: INTRAVENOUS | Status: DC

## 2020-08-30 MED ORDER — STERILE WATER FOR IRRIGATION IR SOLN
Status: DC | PRN
  Administered 2020-08-30: 100 mL

## 2020-08-30 MED ORDER — LACTATED RINGERS IV SOLN: INTRAVENOUS | Status: DC

## 2020-08-30 MED ORDER — ACETAMINOPHEN 500 MG PO TABS: 1000.0000 mg | ORAL_TABLET | Freq: Once | ORAL | Status: DC | PRN

## 2020-08-30 MED ORDER — ONDANSETRON HCL 4 MG/2ML IJ SOLN: 4.0000 mg | Freq: Once | INTRAMUSCULAR | Status: DC | PRN

## 2020-08-30 MED ORDER — PROPOFOL 10 MG/ML IV BOLUS
INTRAVENOUS | Status: DC | PRN
  Administered 2020-08-30: 120 mg via INTRAVENOUS
  Administered 2020-08-30: 20 mg via INTRAVENOUS
  Administered 2020-08-30 (×2): 40 mg via INTRAVENOUS

## 2020-08-30 SURGICAL SUPPLY — 22 items
CLIP HMST 235XBRD CATH ROT (MISCELLANEOUS) IMPLANT
CLIP RESOLUTION 360 11X235 (MISCELLANEOUS)
ELECT REM PT RETURN 9FT ADLT (ELECTROSURGICAL)
ELECTRODE REM PT RTRN 9FT ADLT (ELECTROSURGICAL) IMPLANT
FORCEPS BIOP RAD 4 LRG CAP 4 (CUTTING FORCEPS) ×1 IMPLANT
GOWN CVR UNV OPN BCK APRN NK (MISCELLANEOUS) ×2 IMPLANT
GOWN ISOL THUMB LOOP REG UNIV (MISCELLANEOUS) ×4
INJECTOR VARIJECT VIN23 (MISCELLANEOUS) IMPLANT
KIT DEFENDO VALVE AND CONN (KITS) IMPLANT
KIT PRC NS LF DISP ENDO (KITS) ×1 IMPLANT
KIT PROCEDURE OLYMPUS (KITS) ×2
MANIFOLD NEPTUNE II (INSTRUMENTS) ×2 IMPLANT
MARKER SPOT ENDO TATTOO 5ML (MISCELLANEOUS) IMPLANT
PROBE APC STR FIRE (PROBE) IMPLANT
RETRIEVER NET ROTH 2.5X230 LF (MISCELLANEOUS) IMPLANT
SNARE COLD EXACTO (MISCELLANEOUS) IMPLANT
SNARE SHORT THROW 13M SML OVAL (MISCELLANEOUS) IMPLANT
SNARE SNG USE RND 15MM (INSTRUMENTS) IMPLANT
SPOT EX ENDOSCOPIC TATTOO (MISCELLANEOUS)
TRAP ETRAP POLY (MISCELLANEOUS) IMPLANT
VARIJECT INJECTOR VIN23 (MISCELLANEOUS)
WATER STERILE IRR 250ML POUR (IV SOLUTION) ×2 IMPLANT

## 2020-08-30 NOTE — Transfer of Care (Signed)
Immediate Anesthesia Transfer of Care Note  Patient: Chelsea Gomez  Procedure(s) Performed: COLONOSCOPY WITH PROPOFOL (Rectum) POLYPECTOMY (Rectum)  Patient Location: PACU  Anesthesia Type: General  Level of Consciousness: awake, alert  and patient cooperative  Airway and Oxygen Therapy: Patient Spontanous Breathing and Patient connected to supplemental oxygen  Post-op Assessment: Post-op Vital signs reviewed, Patient's Cardiovascular Status Stable, Respiratory Function Stable, Patent Airway and No signs of Nausea or vomiting  Post-op Vital Signs: Reviewed and stable  Complications: No notable events documented.

## 2020-08-30 NOTE — Op Note (Signed)
[facility] Gastroenterology Patient Name: Chelsea Gomez Procedure Date: 08/30/2020 9:48 AM MRN: [id number] Account #: [id number] Date of Birth: 04-07-1955 Admit Type: Outpatient Age: 64 Room: [facility] OR ROOM 01 Gender: Female Note Status: Finalized Procedure:             Colonoscopy Indications:           High risk colon cancer surveillance: Personal history                         of colonic polyps Providers:             Lucilla Lame MD, MD Medicines:             Propofol per Anesthesia Complications:         No immediate complications. Procedure:             Pre-Anesthesia Assessment:                        - Prior to the procedure, a History and Physical was                         performed, and patient medications and allergies were                         reviewed. The patient's tolerance of previous                         anesthesia was also reviewed. The risks and benefits                         of the procedure and the sedation options and risks                         were discussed with the patient. All questions were                         answered, and informed consent was obtained. Prior                         Anticoagulants: The patient has taken no previous                         anticoagulant or antiplatelet agents. ASA Grade                         Assessment: II - A patient with mild systemic disease.                         After reviewing the risks and benefits, the patient                         was deemed in satisfactory condition to undergo the                         procedure.                        After obtaining informed consent, the colonoscope was  passed under direct vision. Throughout the procedure,                         the patient's blood pressure, pulse, and oxygen                         saturations were monitored continuously. The                         Colonoscope was introduced through the anus  and                         advanced to the the cecum, identified by appendiceal                         orifice and ileocecal valve. The colonoscopy was                         performed without difficulty. The patient tolerated                         the procedure well. The quality of the bowel                         preparation was excellent. Findings:      The perianal and digital rectal examinations were normal.      Two sessile polyps were found in the cecum. The polyps were 2 to 3 mm in       size. These polyps were removed with a cold biopsy forceps. Resection       and retrieval were complete.      Multiple small-mouthed diverticula were found in the sigmoid colon.      Non-bleeding internal hemorrhoids were found during retroflexion. The       hemorrhoids were Grade I (internal hemorrhoids that do not prolapse). Impression:            - Two 2 to 3 mm polyps in the cecum, removed with a                         cold biopsy forceps. Resected and retrieved.                        - Diverticulosis in the sigmoid colon.                        - Non-bleeding internal hemorrhoids. Recommendation:        - Discharge patient to home.                        - Resume previous diet.                        - Continue present medications.                        - Await pathology results.                        - Repeat colonoscopy in 7 years for surveillance. Procedure Code(s):     --- Professional ---  45380, Colonoscopy, flexible; with biopsy, single or                         multiple Diagnosis Code(s):     --- Professional ---                        Z86.010, Personal history of colonic polyps                        K63.5, Polyp of colon CPT copyright 2019 American Medical Association. All rights reserved. The codes documented in this report are preliminary and upon coder review may  be revised to meet current compliance requirements. Lucilla Lame MD,  MD 08/30/2020 10:16:05 AM This report has been signed electronically. Number of Addenda: 0 Note Initiated On: 08/30/2020 9:48 AM Scope Withdrawal Time: 0 hours 9 minutes 4 seconds  Total Procedure Duration: 0 hours 13 minutes 0 seconds  Estimated Blood Loss:  Estimated blood loss: none.      [facility] [place]

## 2020-08-30 NOTE — H&P (Signed)
Midge Minium, MD [facility] [address] [place], [place] [address] Phone:[phone] Fax : [phone]  Primary Care Physician:  [facility] Primary Gastroenterologist:  Dr. Servando Snare  Pre-Procedure History & Physical: HPI:  Chelsea Gomez is a 65 y.o. female is here for an colonoscopy.   Past Medical History:  Diagnosis Date   Arthritis    Left knee   Diabetes mellitus without complication (HCC)    GERD (gastroesophageal reflux disease)    H/O multiple trauma    Hyperlipidemia    Hypertension    Low back pain    Lung collapse    Shoulder pain    left   Thyroid disease    Wears partial dentures     Past Surgical History:  Procedure Laterality Date   BLADDER SURGERY     CESAREAN SECTION     COLONOSCOPY WITH PROPOFOL N/A 04/20/2017   Procedure: COLONOSCOPY WITH PROPOFOL;  Surgeon: Wyline Mood, MD;  Location: [facility] ENDOSCOPY;  Service: Gastroenterology;  Laterality: N/A;   THYROID SURGERY     TOOTH EXTRACTION  2016    Prior to Admission medications   Medication Sig Start Date End Date Taking? Authorizing Provider  aspirin 81 MG tablet Take 81 mg by mouth daily.   Yes [provider]  atorvastatin (LIPITOR) 20 MG tablet Take 1 tablet by mouth once daily 02/06/20  Yes Pollak, Adriana M, PA-C  CALCIUM-VITAMIN D PO Take 800 mg by mouth daily.   Yes [provider]  carvedilol (COREG) 6.25 MG tablet Take 1 tablet by mouth twice daily 03/04/20  Yes Pollak, Adriana M, PA-C  cholecalciferol (VITAMIN D) 1000 UNITS tablet Take 1,000 Units by mouth daily.   Yes [provider]  empagliflozin (JARDIANCE) 25 MG TABS tablet Take 1 tablet (25 mg total) by mouth daily before breakfast. 08/13/19  Yes Pollak, Adriana M, PA-C  glipiZIDE (GLUCOTROL) 10 MG tablet TAKE 1 TABLET BY MOUTH TWICE DAILY 30 MINUTES BEFORE A MEAL 08/13/19  Yes Osvaldo Angst M, PA-C  glucose blood (ONETOUCH VERIO) test strip Use as instructed 12/17/19  Yes Pollak, Adriana M,  PA-C  linagliptin (TRADJENTA) 5 MG TABS tablet Take 1 tablet (5 mg total) by mouth daily. 08/12/20 02/08/21 Yes Chrismon, Jodell Cipro, PA-C  meloxicam (MOBIC) 7.5 MG tablet Take 1 tablet (7.5 mg total) by mouth daily. 08/12/20  Yes Chrismon, Jodell Cipro, PA-C  metFORMIN (GLUCOPHAGE) 1000 MG tablet Take 1 tablet by mouth twice daily 03/04/20  Yes Pollak, Adriana M, PA-C  Na Sulfate-K Sulfate-Mg Sulf (SUPREP BOWEL PREP KIT) 17.5-3.13-1.6 GM/177ML SOLN Take 1 kit by mouth as directed. 08/16/20  Yes Midge Minium, MD  olopatadine (PATANOL) 0.1 % ophthalmic solution INSTILL 1 DROP INTO EACH EYE TWICE DAILY 08/02/17  Yes [provider]  quinapril (ACCUPRIL) 20 MG tablet Take 1 tablet (20 mg total) by mouth at bedtime. 08/12/20  Yes Chrismon, Jodell Cipro, PA-C    Allergies as of 08/16/2020 - Review Complete 08/16/2020  Allergen Reaction Noted   Mevacor [lovastatin] Other (See Comments) 09/03/2013    Family History  Problem Relation Age of Onset   Heart attack Mother    Stroke Mother    Diabetes Mother    Aneurysm Father    Breast cancer Sister    Diabetes Sister    Ovarian cancer Neg Hx    Colon cancer Neg Hx     Social History   Socioeconomic History   Marital status: Single    Spouse name: Not  on file   Number of children: Not on file   Years of education: Not on file   Highest education level: Not on file  Occupational History   Not on file  Tobacco Use   Smoking status: Former    Pack years: 0.00    Types: Cigarettes   Smokeless tobacco: Never  Vaping Use   Vaping Use: Never used  Substance and Sexual Activity   Alcohol use: No   Drug use: No   Sexual activity: Not Currently    Birth control/protection: Post-menopausal  Other Topics Concern   Not on file  Social History Narrative   Not on file   Social Determinants of Health   Financial Resource Strain: Not on file  Food Insecurity: Not on file  Transportation Needs: Not on file  Physical Activity: Not on file  Stress:  Not on file  Social Connections: Not on file  Intimate Partner Violence: Not on file    Review of Systems: See HPI, otherwise negative ROS  Physical Exam: BP (!) 142/84   Pulse 82   Temp (!) 97.3 F (36.3 C) (Temporal)   Ht $R'5\' 2"'Lt$  (1.575 m)   Wt 103.4 kg   SpO2 96%   BMI 41.70 kg/m  General:   Alert,  pleasant and cooperative in NAD Head:  Normocephalic and atraumatic. Neck:  Supple; no masses or thyromegaly. Lungs:  Clear throughout to auscultation.    Heart:  Regular rate and rhythm. Abdomen:  Soft, nontender and nondistended. Normal bowel sounds, without guarding, and without rebound.   Neurologic:  Alert and  oriented x4;  grossly normal neurologically.  Impression/Plan: Chelsea Gomez is here for an colonoscopy to be performed for a history of adenomatous polyps on 2019   Risks, benefits, limitations, and alternatives regarding  colonoscopy have been reviewed with the patient.  Questions have been answered.  All parties agreeable.   Lucilla Lame, MD  08/30/2020, 9:19 AM

## 2020-08-30 NOTE — Anesthesia Preprocedure Evaluation (Signed)
Anesthesia Evaluation  Patient identified by MRN, date of birth, ID band Patient awake    Reviewed: Allergy & Precautions, H&P , NPO status , Patient's Chart, lab work & pertinent test results, reviewed documented beta blocker date and time   Airway Mallampati: II  TM Distance: >3 FB Neck ROM: full    Dental no notable dental hx.    Pulmonary neg pulmonary ROS, former smoker,    Pulmonary exam normal breath sounds clear to auscultation       Cardiovascular hypertension,  Rhythm:regular Rate:Normal     Neuro/Psych negative neurological ROS  negative psych ROS   GI/Hepatic Neg liver ROS, GERD  ,  Endo/Other  diabetes  Renal/GU negative Renal ROS  negative genitourinary   Musculoskeletal   Abdominal   Peds  Hematology negative hematology ROS (+)   Anesthesia Other Findings BMI > 40  Reproductive/Obstetrics negative OB ROS                             Anesthesia Physical Anesthesia Plan  ASA: 3  Anesthesia Plan: General   Post-op Pain Management:    Induction:   PONV Risk Score and Plan: 2 and TIVA  Airway Management Planned:   Additional Equipment:   Intra-op Plan:   Post-operative Plan:   Informed Consent: I have reviewed the patients History and Physical, chart, labs and discussed the procedure including the risks, benefits and alternatives for the proposed anesthesia with the patient or authorized representative who has indicated his/her understanding and acceptance.     Dental Advisory Given  Plan Discussed with: CRNA  Anesthesia Plan Comments:         Anesthesia Quick Evaluation

## 2020-08-30 NOTE — Anesthesia Procedure Notes (Signed)
Date/Time: 08/30/2020 9:58 AM Performed by: Cameron Ali, CRNA Pre-anesthesia Checklist: Patient identified, Emergency Drugs available, Suction available, Timeout performed and Patient being monitored Patient Re-evaluated:Patient Re-evaluated prior to induction Oxygen Delivery Method: Nasal cannula Placement Confirmation: positive ETCO2

## 2020-08-30 NOTE — Anesthesia Postprocedure Evaluation (Signed)
Anesthesia Post Note  Patient: Chelsea Gomez  Procedure(s) Performed: COLONOSCOPY WITH PROPOFOL (Rectum) POLYPECTOMY (Rectum)     Patient location during evaluation: PACU Anesthesia Type: General Level of consciousness: awake and alert Pain management: pain level controlled Vital Signs Assessment: post-procedure vital signs reviewed and stable Respiratory status: spontaneous breathing, nonlabored ventilation, respiratory function stable and patient connected to nasal cannula oxygen Cardiovascular status: blood pressure returned to baseline and stable Postop Assessment: no apparent nausea or vomiting Anesthetic complications: no   No notable events documented.  April Manson

## 2020-08-31 ENCOUNTER — Encounter: Payer: Self-pay | Admitting: Gastroenterology

## 2020-08-31 LAB — SURGICAL PATHOLOGY

## 2020-09-01 ENCOUNTER — Encounter: Payer: Self-pay | Admitting: Gastroenterology

## 2020-11-18 ENCOUNTER — Telehealth: Payer: Self-pay | Admitting: Family Medicine

## 2020-11-18 NOTE — Telephone Encounter (Signed)
PT refill request for the following medications:  empagliflozin (JARDIANCE) 25 MG TABS tablet   Please advise.  [place] (N), [place] [facility] [place]

## 2020-11-19 ENCOUNTER — Other Ambulatory Visit: Payer: Self-pay

## 2020-11-19 DIAGNOSIS — E119 Type 2 diabetes mellitus without complications: Secondary | ICD-10-CM

## 2020-11-19 DIAGNOSIS — E113299 Type 2 diabetes mellitus with mild nonproliferative diabetic retinopathy without macular edema, unspecified eye: Secondary | ICD-10-CM

## 2020-11-19 MED ORDER — EMPAGLIFLOZIN 25 MG PO TABS: 25.0000 mg | ORAL_TABLET | Freq: Every day | ORAL | 1 refills | Status: DC

## 2020-11-22 NOTE — Telephone Encounter (Signed)
Prescription was filled on 11/19/20. KW

## 2020-12-01 ENCOUNTER — Encounter: Payer: Self-pay | Admitting: Obstetrics and Gynecology

## 2020-12-01 ENCOUNTER — Ambulatory Visit (INDEPENDENT_AMBULATORY_CARE_PROVIDER_SITE_OTHER): Payer: Medicare Other | Admitting: Obstetrics and Gynecology

## 2020-12-01 ENCOUNTER — Other Ambulatory Visit (HOSPITAL_COMMUNITY)
Admission: RE | Admit: 2020-12-01 | Discharge: 2020-12-01 | Disposition: A | Discharge status: Home / Self Care | Payer: Medicare Other | Source: Ambulatory Visit | Attending: Obstetrics and Gynecology | Admitting: Obstetrics and Gynecology

## 2020-12-01 ENCOUNTER — Other Ambulatory Visit: Payer: Self-pay

## 2020-12-01 VITALS — BP 145/82 | HR 93 | Ht 62.0 in | Wt 236.2 lb

## 2020-12-01 DIAGNOSIS — Z9889 Other specified postprocedural states: Secondary | ICD-10-CM | POA: Diagnosis present

## 2020-12-01 DIAGNOSIS — Z01419 Encounter for gynecological examination (general) (routine) without abnormal findings: Secondary | ICD-10-CM

## 2020-12-01 DIAGNOSIS — Z1151 Encounter for screening for human papillomavirus (HPV): Secondary | ICD-10-CM | POA: Diagnosis not present

## 2020-12-01 DIAGNOSIS — Z1231 Encounter for screening mammogram for malignant neoplasm of breast: Secondary | ICD-10-CM | POA: Diagnosis not present

## 2020-12-01 NOTE — Progress Notes (Signed)
Pt present for annual exam. Pt stated that she was doig well.

## 2020-12-01 NOTE — Progress Notes (Signed)
HPI:      [name] is a 65 y.o. G2P1011 who LMP was No LMP recorded. Patient is postmenopausal.  Subjective:   She presents today for her annual examination.  She has no complaints.  She says that she has 85 to 90% of her smell back after having COVID 2 years ago.  She has received 3 COVID vaccination shots and is due for her second booster.    Hx: The following portions of the patient's history were reviewed and updated as appropriate:             She  has a past medical history of Arthritis, Diabetes mellitus without complication ([place]), GERD (gastroesophageal reflux disease), H/O multiple trauma, Hyperlipidemia, Hypertension, Low back pain, Lung collapse, Shoulder pain, Thyroid disease, and Wears partial dentures. She does not have any pertinent problems on file. She  has a past surgical history that includes Thyroid surgery; Bladder surgery; Cesarean section; Tooth extraction (2016); Colonoscopy with propofol (N/A, 04/20/2017); Colonoscopy with propofol (N/A, 08/30/2020); and polypectomy (N/A, 08/30/2020). Her family history includes Aneurysm in her father; Breast cancer in her sister; Diabetes in her mother and sister; Heart attack in her mother; Stroke in her mother. She  reports that she has quit smoking. Her smoking use included cigarettes. She has never used smokeless tobacco. She reports that she does not drink alcohol and does not use drugs. She has a current medication list which includes the following prescription(s): aspirin, atorvastatin, calcium-vitamin d, carvedilol, cholecalciferol, empagliflozin, glipizide, onetouch verio, linagliptin, meloxicam, metformin, suprep bowel prep kit, olopatadine, and quinapril. She is allergic to mevacor [lovastatin].       Review of Systems:  Review of Systems  Constitutional: Denied constitutional symptoms, night sweats, recent illness, fatigue, fever, insomnia and weight loss.  Eyes: Denied eye symptoms, eye pain, photophobia, vision  change and visual disturbance.  Ears/Nose/Throat/Neck: Denied ear, nose, throat or neck symptoms, hearing loss, nasal discharge, sinus congestion and sore throat.  Cardiovascular: Denied cardiovascular symptoms, arrhythmia, chest pain/pressure, edema, exercise intolerance, orthopnea and palpitations.  Respiratory: Denied pulmonary symptoms, asthma, pleuritic pain, productive sputum, cough, dyspnea and wheezing.  Gastrointestinal: Denied, gastro-esophageal reflux, melena, nausea and vomiting.  Genitourinary: Denied genitourinary symptoms including symptomatic vaginal discharge, pelvic relaxation issues, and urinary complaints.  Musculoskeletal: Denied musculoskeletal symptoms, stiffness, swelling, muscle weakness and myalgia.  Dermatologic: Denied dermatology symptoms, rash and scar.  Neurologic: Denied neurology symptoms, dizziness, headache, neck pain and syncope.  Psychiatric: Denied psychiatric symptoms, anxiety and depression.  Endocrine: Denied endocrine symptoms including hot flashes and night sweats.   Meds:   Current Outpatient Medications on File Prior to Visit  Medication Sig Dispense Refill   aspirin 81 MG tablet Take 81 mg by mouth daily.     atorvastatin (LIPITOR) 20 MG tablet Take 1 tablet by mouth once daily 90 tablet 3   CALCIUM-VITAMIN D PO Take 800 mg by mouth daily.     carvedilol (COREG) 6.25 MG tablet Take 1 tablet by mouth twice daily 180 tablet 1   cholecalciferol (VITAMIN D) 1000 UNITS tablet Take 1,000 Units by mouth daily.     empagliflozin (JARDIANCE) 25 MG TABS tablet Take 1 tablet (25 mg total) by mouth daily before breakfast. 90 tablet 1   glipiZIDE (GLUCOTROL) 10 MG tablet TAKE 1 TABLET BY MOUTH TWICE DAILY 30 MINUTES BEFORE A MEAL 180 tablet 1   glucose blood (ONETOUCH VERIO) test strip Use as instructed 100 each 12   linagliptin (TRADJENTA) 5 MG TABS tablet Take  1 tablet (5 mg total) by mouth daily. 90 tablet 1   meloxicam (MOBIC) 7.5 MG tablet Take 1 tablet  (7.5 mg total) by mouth daily. 30 tablet 0   metFORMIN (GLUCOPHAGE) 1000 MG tablet Take 1 tablet by mouth twice daily 180 tablet 1   Na Sulfate-K Sulfate-Mg Sulf (SUPREP BOWEL PREP KIT) 17.5-3.13-1.6 GM/177ML SOLN Take 1 kit by mouth as directed. 354 mL 0   olopatadine (PATANOL) 0.1 % ophthalmic solution INSTILL 1 DROP INTO EACH EYE TWICE DAILY  3   quinapril (ACCUPRIL) 20 MG tablet Take 1 tablet (20 mg total) by mouth at bedtime. 90 tablet 1   No current facility-administered medications on file prior to visit.     Objective:     Vitals:   12/01/20 0918  BP: (!) 145/82  Pulse: 93    Filed Weights   12/01/20 0918  Weight: 236 lb 3.2 oz (107.1 kg)              Physical examination General NAD, Conversant  HEENT Atraumatic; Op clear with mmm.  Normo-cephalic. Pupils reactive. Anicteric sclerae  Thyroid/Neck Smooth without nodularity or enlargement. Normal ROM.  Neck Supple.  Skin No rashes, lesions or ulceration. Normal palpated skin turgor. No nodularity.  Breasts: No masses or discharge.  Symmetric.  No axillary adenopathy.  Lungs: Clear to auscultation.No rales or wheezes. Normal Respiratory effort, no retractions.  Heart: NSR.  No murmurs or rubs appreciated. No periferal edema  Abdomen: Soft.  Non-tender.  No masses.  No HSM. No hernia  Extremities: Moves all appropriately.  Normal ROM for age. No lymphadenopathy.  Neuro: Oriented to PPT.  Normal mood. Normal affect.     Pelvic:   Vulva: Normal appearance.  No lesions.  Vagina: No lesions or abnormalities noted.  Support: Normal pelvic support.  Urethra No masses tenderness or scarring.  Meatus Normal size without lesions or prolapse.  Cervix: Normal appearance.  No lesions.  Anus: Normal exam.  No lesions.  Perineum: Normal exam.  No lesions.        Bimanual   Uterus: Normal size.  Non-tender.  Mobile.  AV.  Adnexae: No masses.  Non-tender to palpation.  Cul-de-sac: Negative for abnormality.     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 Encounter for well woman exam with routine gynecological exam   2. Breast cancer screening by mammogram   3. History of loop electrical excision procedure (LEEP)     Patient doing well.  History of CIN-1 status post LEEP.   Plan:            1.  Basic Screening Recommendations The basic screening recommendations for asymptomatic women were discussed with the patient during her visit.  The age-appropriate recommendations were discussed with her and the rational for the tests reviewed.  When I am informed by the patient that another primary care physician has previously obtained the age-appropriate tests and they are up-to-date, only outstanding tests are ordered and referrals  given as necessary.  Abnormal results of tests will be discussed with her when all of her results are completed.  Routine preventative health maintenance measures emphasized: Exercise/Diet/Weight control, Tobacco Warnings, Alcohol/Substance use risks and Stress Management Pap performed-if normal this can be her last one. Mammogram ordered Patient does annual blood work through her PCP. Orders No orders of the defined types were placed in this encounter.   No orders of the defined types were placed in this encounter.         F/U  No follow-ups on file.  Finis Bud, M.D. 12/01/2020 9:56 AM

## 2020-12-02 ENCOUNTER — Ambulatory Visit: Payer: Medicare Other | Admitting: Family Medicine

## 2020-12-02 LAB — CYTOLOGY - PAP
Comment: NEGATIVE
Diagnosis: NEGATIVE
High risk HPV: NEGATIVE

## 2020-12-16 ENCOUNTER — Other Ambulatory Visit: Payer: Self-pay

## 2020-12-16 ENCOUNTER — Encounter: Payer: Self-pay | Admitting: Family Medicine

## 2020-12-16 ENCOUNTER — Ambulatory Visit (INDEPENDENT_AMBULATORY_CARE_PROVIDER_SITE_OTHER): Payer: Medicare Other | Admitting: Family Medicine

## 2020-12-16 VITALS — BP 142/84 | HR 85 | Temp 97.6°F | Wt 233.9 lb

## 2020-12-16 DIAGNOSIS — E119 Type 2 diabetes mellitus without complications: Secondary | ICD-10-CM

## 2020-12-16 DIAGNOSIS — I10 Essential (primary) hypertension: Secondary | ICD-10-CM

## 2020-12-16 DIAGNOSIS — E113299 Type 2 diabetes mellitus with mild nonproliferative diabetic retinopathy without macular edema, unspecified eye: Secondary | ICD-10-CM | POA: Diagnosis not present

## 2020-12-16 DIAGNOSIS — Z23 Encounter for immunization: Secondary | ICD-10-CM

## 2020-12-16 LAB — POCT GLYCOSYLATED HEMOGLOBIN (HGB A1C): Hemoglobin A1C: 8.2 % — AB (ref 4.0–5.6)

## 2020-12-16 NOTE — Assessment & Plan Note (Signed)
Chronic, stable Encouraged DASH diet Denies problems with medications

## 2020-12-16 NOTE — Assessment & Plan Note (Signed)
bmi 42 -dm2 -htn Discussed importance of healthy weight management Discussed diet and exercise

## 2020-12-16 NOTE — Assessment & Plan Note (Signed)
A1c has gotten higher; pt claims to be without medications for >2 weeks Also noted that lost taste buds s/s covid now returned and eating/tasting everything Recommend smaller/more frequent meals

## 2020-12-16 NOTE — Assessment & Plan Note (Signed)
Has all medication; encourage communication with office if out of medication as we have samples on hand if needed

## 2020-12-16 NOTE — Progress Notes (Signed)
Established patient visit   Patient: Chelsea Gomez   DOB: Apr 22, 1955   65 y.o. Female  MRN: [id number] Visit Date: 12/16/2020  Today's healthcare provider: Gwyneth Sprout, FNP   Chief Complaint  Patient presents with   Diabetes   Hypertension   Hyperlipidemia   Subjective    HPI  Diabetes Mellitus Type II, follow-up  Lab Results  Component Value Date   HGBA1C 8.2 (A) 12/16/2020   HGBA1C 7.4 (A) 08/12/2020   HGBA1C 7.6 (A) 04/14/2020   Last seen for diabetes 3 months ago.  Management since then includes continuing the same treatment. She reports fair compliance with treatment. Pt wasn't able to get refill for several weeks.  She is not having side effects.   Home blood sugar records:  160-169  Episodes of hypoglycemia? No    Current insulin regiment: none Most Recent Eye Exam: 04/28/20  --------------------------------------------------------------------------------------------------- Hypertension, follow-up  BP Readings from Last 3 Encounters:  12/16/20 (!) 142/84  12/01/20 (!) 145/82  08/30/20 123/84   Wt Readings from Last 3 Encounters:  12/16/20 233 lb 14.4 oz (106.1 kg)  12/01/20 236 lb 3.2 oz (107.1 kg)  08/30/20 228 lb (103.4 kg)     She was last seen for hypertension 3 months ago.  BP at that visit was 123/84. Management since that visit includes none. She reports excellent compliance with treatment. She is not having side effects.  She is not exercising. She is not adherent to low salt diet.   Outside blood pressures are not checked.  She does not smoke.  Use of agents associated with hypertension: NSAIDS.   --------------------------------------------------------------------------------------------------- Lipid/Cholesterol, follow-up  Last Lipid Panel: Lab Results  Component Value Date   CHOL 144 08/25/2020   LDLCALC 84 08/25/2020   HDL 41 08/25/2020   TRIG 105 08/25/2020    She was last seen for this 3 months ago.  Management  since that visit includes none.  She reports excellent compliance with treatment. She is not having side effects.   Symptoms: No appetite changes No foot ulcerations  No chest pain No chest pressure/discomfort  No dyspnea No orthopnea  No fatigue No lower extremity edema  No palpitations No paroxysmal nocturnal dyspnea  No nausea No numbness or tingling of extremity  No polydipsia No polyuria  No speech difficulty No syncope   She is following a Regular diet. Current exercise: none  Last metabolic panel Lab Results  Component Value Date   GLUCOSE 163 (H) 08/25/2020   NA 142 08/25/2020   K 4.1 08/25/2020   BUN 12 08/25/2020   CREATININE 0.48 (L) 08/25/2020   EGFR 104 08/25/2020   GFRNONAA 102 04/14/2020   CALCIUM 9.5 08/25/2020   AST 14 08/25/2020   ALT 12 08/25/2020   The 10-year ASCVD risk score (Arnett DK, et al., 2019) is: 21.3%  ---------------------------------------------------------------------------------------------------   Medications: Outpatient Medications Prior to Visit  Medication Sig   aspirin 81 MG tablet Take 81 mg by mouth daily.   atorvastatin (LIPITOR) 20 MG tablet Take 1 tablet by mouth once daily   CALCIUM-VITAMIN D PO Take 800 mg by mouth daily.   carvedilol (COREG) 6.25 MG tablet Take 1 tablet by mouth twice daily   cholecalciferol (VITAMIN D) 1000 UNITS tablet Take 1,000 Units by mouth daily.   empagliflozin (JARDIANCE) 25 MG TABS tablet Take 1 tablet (25 mg total) by mouth daily before breakfast.   glipiZIDE (GLUCOTROL) 10 MG tablet TAKE 1 TABLET BY MOUTH TWICE  DAILY 30 MINUTES BEFORE A MEAL   glucose blood (ONETOUCH VERIO) test strip Use as instructed   linagliptin (TRADJENTA) 5 MG TABS tablet Take 1 tablet (5 mg total) by mouth daily.   meloxicam (MOBIC) 7.5 MG tablet Take 1 tablet (7.5 mg total) by mouth daily.   metFORMIN (GLUCOPHAGE) 1000 MG tablet Take 1 tablet by mouth twice daily   Na Sulfate-K Sulfate-Mg Sulf (SUPREP BOWEL PREP KIT)  17.5-3.13-1.6 GM/177ML SOLN Take 1 kit by mouth as directed.   olopatadine (PATANOL) 0.1 % ophthalmic solution INSTILL 1 DROP INTO EACH EYE TWICE DAILY   quinapril (ACCUPRIL) 20 MG tablet Take 1 tablet (20 mg total) by mouth at bedtime.   No facility-administered medications prior to visit.    Review of Systems     Objective    BP (!) 142/84   Pulse 85   Temp 97.6 F (36.4 C) (Oral)   Wt 233 lb 14.4 oz (106.1 kg)   BMI 42.78 kg/m  {Show previous vital signs (optional):23777}  Physical Exam Vitals and nursing note reviewed.  Constitutional:      General: She is not in acute distress.    Appearance: Normal appearance. She is obese. She is not ill-appearing, toxic-appearing or diaphoretic.  HENT:     Head: Normocephalic and atraumatic.  Cardiovascular:     Rate and Rhythm: Normal rate and regular rhythm.     Pulses: Normal pulses.     Heart sounds: Normal heart sounds. No murmur heard.   No friction rub. No gallop.  Pulmonary:     Effort: Pulmonary effort is normal. No respiratory distress.     Breath sounds: Normal breath sounds. No stridor. No wheezing, rhonchi or rales.  Chest:     Chest wall: No tenderness.  Abdominal:     General: Bowel sounds are normal.     Palpations: Abdomen is soft.     Comments: Adiposity   Musculoskeletal:        General: No swelling, tenderness, deformity or signs of injury. Normal range of motion.     Right lower leg: No edema.     Left lower leg: No edema.  Skin:    General: Skin is warm and dry.     Capillary Refill: Capillary refill takes less than 2 seconds.     Coloration: Skin is not jaundiced or pale.     Findings: No bruising, erythema, lesion or rash.  Neurological:     General: No focal deficit present.     Mental Status: She is alert and oriented to person, place, and time. Mental status is at baseline.     Cranial Nerves: No cranial nerve deficit.     Sensory: No sensory deficit.     Motor: No weakness.     Coordination:  Coordination normal.  Psychiatric:        Mood and Affect: Mood normal.        Behavior: Behavior normal.        Thought Content: Thought content normal.        Judgment: Judgment normal.     Results for orders placed or performed in visit on 12/16/20  POCT glycosylated hemoglobin (Hb A1C)  Result Value Ref Range   Hemoglobin A1C 8.2 (A) 4.0 - 5.6 %   HbA1c POC (<> result, manual entry)     HbA1c, POC (prediabetic range)     HbA1c, POC (controlled diabetic range)      Assessment & Plan     Problem List  Items Addressed This Visit       Cardiovascular and Mediastinum   Hypertension    Chronic, stable Encouraged DASH diet Denies problems with medications        Endocrine   Diabetes mellitus without complication (HCC)    V6P has gotten higher; pt claims to be without medications for >2 weeks Also noted that lost taste buds s/s covid now returned and eating/tasting everything Recommend smaller/more frequent meals      Type 2 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, without long-term current use of insulin ([place]) - Primary    Has all medication; encourage communication with office if out of medication as we have samples on hand if needed       Relevant Orders   POCT glycosylated hemoglobin (Hb A1C) (Completed)     Other   Morbid obesity ([place])    bmi 42 -dm2 -htn Discussed importance of healthy weight management Discussed diet and exercise       Other Visit Diagnoses     Need for influenza vaccination       Relevant Orders   Flu Vaccine QUAD High Dose(Fluad) (Completed)        Return in about 3 months (around 03/18/2021) for T2DM management.      Vonna Kotyk, FNP, have reviewed all documentation for this visit. The documentation on 12/16/20 for the exam, diagnosis, procedures, and orders are all accurate and complete.    Gwyneth Sprout, [place] [phone] (phone) [phone]  (fax)  [place]

## 2021-02-24 LAB — HM MAMMOGRAPHY

## 2021-03-04 ENCOUNTER — Telehealth: Payer: Self-pay | Admitting: Family Medicine

## 2021-03-04 DIAGNOSIS — I10 Essential (primary) hypertension: Secondary | ICD-10-CM

## 2021-03-04 MED ORDER — CARVEDILOL 6.25 MG PO TABS: 6.2500 mg | ORAL_TABLET | Freq: Two times a day (BID) | ORAL | 1 refills | Status: DC

## 2021-03-04 NOTE — Telephone Encounter (Signed)
[facility] faxed refill request for the following medications:  carvedilol (COREG) 6.25 MG tablet   Please advise.

## 2021-03-10 ENCOUNTER — Other Ambulatory Visit: Payer: Self-pay | Admitting: Family Medicine

## 2021-03-10 DIAGNOSIS — E119 Type 2 diabetes mellitus without complications: Secondary | ICD-10-CM

## 2021-03-10 MED ORDER — METFORMIN HCL 1000 MG PO TABS: 1000.0000 mg | ORAL_TABLET | Freq: Two times a day (BID) | ORAL | 0 refills | Status: DC

## 2021-03-10 NOTE — Telephone Encounter (Signed)
Copied from [place] [id number]. Topic: Quick Communication - Rx Refill/Question >> Mar 10, 2021 11:25 AM Tessa Lerner A wrote: Medication: metFORMIN (GLUCOPHAGE) 1000 MG tablet [[id number]] - patient has 0 (zero) tablets remaining   Has the patient contacted their pharmacy? Yes.  The patient has been directed to contact their PCP (Agent: If no, request that the patient contact the pharmacy for the refill. If patient does not wish to contact the pharmacy document the reason why and proceed with request.) (Agent: If yes, when and what did the pharmacy advise?)  Preferred Pharmacy (with phone number or street name): [facility] (N), [place] - [address] [place] ([place]) [place] [address] Phone: [phone] Fax: [phone]  Has the patient been seen for an appointment in the last year OR does the patient have an upcoming appointment? Yes.    Agent: Please be advised that RX refills may take up to 3 business days. We ask that you follow-up with your pharmacy.

## 2021-03-10 NOTE — Telephone Encounter (Signed)
Requested Prescriptions  Pending Prescriptions Disp Refills   metFORMIN (GLUCOPHAGE) 1000 MG tablet 180 tablet 0    Sig: Take 1 tablet (1,000 mg total) by mouth 2 (two) times daily.     Endocrinology:  Diabetes - Biguanides Failed - 03/10/2021 11:38 AM      Failed - Cr in normal range and within 360 days    Creatinine  Date Value Ref Range Status  12/16/2011 0.61 0.60 - 1.30 mg/dL Final   Creatinine, Ser  Date Value Ref Range Status  08/25/2020 0.48 (L) 0.57 - 1.00 mg/dL Final         Failed - HBA1C is between 0 and 7.9 and within 180 days    Hemoglobin A1C  Date Value Ref Range Status  12/16/2020 8.2 (A) 4.0 - 5.6 % Final   Hgb A1c MFr Bld  Date Value Ref Range Status  08/17/2015 7.9 (H) 4.8 - 5.6 % Final    Comment:             Pre-diabetes: 5.7 - 6.4          Diabetes: >6.4          Glycemic control for adults with diabetes: <7.0          Passed - AA eGFR in normal range and within 360 days    EGFR (African American)  Date Value Ref Range Status  12/16/2011 >60  Final   GFR calc Af Amer  Date Value Ref Range Status  04/14/2020 117 >59 mL/min/1.73 Final    Comment:    **In accordance with recommendations from the NKF-ASN Task force,**   [facility] is in the process of updating its eGFR calculation to the   2021 CKD-EPI creatinine equation that estimates kidney function   without a race variable.    EGFR (Non-African Amer.)  Date Value Ref Range Status  12/16/2011 >60  Final    Comment:    eGFR values <3mL/min/1.73 m2 may be an indication of chronic kidney disease (CKD). Calculated eGFR is useful in patients with stable renal function. The eGFR calculation will not be reliable in acutely ill patients when serum creatinine is changing rapidly. It is not useful in  patients on dialysis. The eGFR calculation may not be applicable to patients at the low and high extremes of body sizes, pregnant women, and vegetarians. POTASSIUM,AST - Slight hemolysis, interpret  results with  - caution.    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, without long-term current use of insulin, unspecified laterality [facility])   [place], Vickki Muff, PA-C   11 months ago 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

## 2021-03-30 ENCOUNTER — Ambulatory Visit
Admission: RE | Admit: 2021-03-30 | Discharge: 2021-03-30 | Disposition: A | Discharge status: Home / Self Care | Payer: Medicare Other | Source: Ambulatory Visit | Attending: Family Medicine | Admitting: Family Medicine

## 2021-03-30 ENCOUNTER — Encounter: Payer: Self-pay | Admitting: Family Medicine

## 2021-03-30 ENCOUNTER — Other Ambulatory Visit: Payer: Self-pay

## 2021-03-30 ENCOUNTER — Ambulatory Visit
Admission: RE | Admit: 2021-03-30 | Discharge: 2021-03-30 | Disposition: A | Discharge status: Home / Self Care | Payer: Medicare Other | Attending: Family Medicine | Admitting: Family Medicine

## 2021-03-30 ENCOUNTER — Ambulatory Visit (INDEPENDENT_AMBULATORY_CARE_PROVIDER_SITE_OTHER): Payer: Medicare Other | Admitting: Family Medicine

## 2021-03-30 VITALS — BP 125/59 | HR 88 | Temp 98.9°F | Resp 16 | Wt 231.0 lb

## 2021-03-30 DIAGNOSIS — M19041 Primary osteoarthritis, right hand: Secondary | ICD-10-CM | POA: Diagnosis not present

## 2021-03-30 DIAGNOSIS — W19XXXA Unspecified fall, initial encounter: Secondary | ICD-10-CM | POA: Insufficient documentation

## 2021-03-30 DIAGNOSIS — E119 Type 2 diabetes mellitus without complications: Secondary | ICD-10-CM | POA: Diagnosis not present

## 2021-03-30 DIAGNOSIS — E1159 Type 2 diabetes mellitus with other circulatory complications: Secondary | ICD-10-CM | POA: Diagnosis not present

## 2021-03-30 DIAGNOSIS — R350 Frequency of micturition: Secondary | ICD-10-CM

## 2021-03-30 DIAGNOSIS — E113299 Type 2 diabetes mellitus with mild nonproliferative diabetic retinopathy without macular edema, unspecified eye: Secondary | ICD-10-CM

## 2021-03-30 DIAGNOSIS — I152 Hypertension secondary to endocrine disorders: Secondary | ICD-10-CM

## 2021-03-30 DIAGNOSIS — E1169 Type 2 diabetes mellitus with other specified complication: Secondary | ICD-10-CM | POA: Diagnosis not present

## 2021-03-30 DIAGNOSIS — M25531 Pain in right wrist: Secondary | ICD-10-CM | POA: Insufficient documentation

## 2021-03-30 DIAGNOSIS — E785 Hyperlipidemia, unspecified: Secondary | ICD-10-CM

## 2021-03-30 LAB — POCT URINALYSIS DIPSTICK
Bilirubin, UA: NEGATIVE
Blood, UA: NEGATIVE
Glucose, UA: POSITIVE — AB
Ketones, UA: NEGATIVE
Leukocytes, UA: NEGATIVE
Nitrite, UA: NEGATIVE
Protein, UA: NEGATIVE
Spec Grav, UA: 1.01 (ref 1.010–1.025)
Urobilinogen, UA: 0.2 E.U./dL
pH, UA: 5 (ref 5.0–8.0)

## 2021-03-30 LAB — POCT GLYCOSYLATED HEMOGLOBIN (HGB A1C): Hemoglobin A1C: 7.9 % — AB (ref 4.0–5.6)

## 2021-03-30 MED ORDER — EMPAGLIFLOZIN 25 MG PO TABS: 25.0000 mg | ORAL_TABLET | Freq: Every day | ORAL | 3 refills | Status: DC

## 2021-03-30 MED ORDER — PREDNISONE 10 MG PO TABS: 10.0000 mg | ORAL_TABLET | Freq: Every day | ORAL | 0 refills | Status: DC

## 2021-03-30 MED ORDER — METFORMIN HCL 1000 MG PO TABS: 1000.0000 mg | ORAL_TABLET | Freq: Two times a day (BID) | ORAL | 3 refills | Status: DC

## 2021-03-30 MED ORDER — LISINOPRIL 20 MG PO TABS: 40.0000 mg | ORAL_TABLET | Freq: Every day | ORAL | 3 refills | Status: DC

## 2021-03-30 MED ORDER — MELOXICAM 7.5 MG PO TABS: 7.5000 mg | ORAL_TABLET | Freq: Every day | ORAL | 0 refills | Status: DC

## 2021-03-30 MED ORDER — GLIPIZIDE 10 MG PO TABS: ORAL_TABLET | ORAL | 3 refills | Status: DC

## 2021-03-30 NOTE — Assessment & Plan Note (Signed)
A1c <8 Continue to work on diet and add exercise Pt report of out of medication for 3 wks; however, chart review showed no communication from pharmacy to clinic for metformin Encouraged to call after 72 hours if she does not get a response Pt in agreement

## 2021-03-30 NOTE — Assessment & Plan Note (Signed)
Repeat lipid panel Goal LDL <70

## 2021-03-30 NOTE — Assessment & Plan Note (Signed)
[place] a few weeks ago, when trimming rose bushes Reported that [facility] Slipped on brick Healing abrasions to bilateral hands Slight bruise remains to abdomen

## 2021-03-30 NOTE — Assessment & Plan Note (Signed)
Snuff box pain on palpation No limited ROM

## 2021-03-30 NOTE — Assessment & Plan Note (Signed)
No signs of infection Glucose positive Continue to reinforce diebetic diet and ensure medication compliance

## 2021-03-30 NOTE — Progress Notes (Signed)
Established patient visit   Patient: Chelsea Gomez   DOB: May 25, 1955   66 y.o. Female  MRN: [id number] Visit Date: 03/30/2021  Today's healthcare provider: Gwyneth Sprout, FNP   Chief Complaint  Patient presents with   Diabetes   Hypertension   Subjective    HPI  Diabetes Mellitus Type II, Follow-up  Lab Results  Component Value Date   HGBA1C 8.2 (A) 12/16/2020   HGBA1C 7.4 (A) 08/12/2020   HGBA1C 7.6 (A) 04/14/2020   Wt Readings from Last 3 Encounters:  03/30/21 231 lb (104.8 kg)  12/16/20 233 lb 14.4 oz (106.1 kg)  12/01/20 236 lb 3.2 oz (107.1 kg)   Last seen for diabetes 3 months ago.  Management since then includes none. She reports excellent compliance with treatment. She is not having side effects.  Symptoms: No fatigue No foot ulcerations  No appetite changes No nausea  No paresthesia of the feet  No polydipsia  No polyuria No visual disturbances   No vomiting     Home blood sugar records:  not checked  Episodes of hypoglycemia? No     Current insulin regiment: none Most Recent Eye Exam: within 12 months Current exercise: no regular exercise Current diet habits: well balanced  Pertinent Labs: Lab Results  Component Value Date   CHOL 144 08/25/2020   HDL 41 08/25/2020   LDLCALC 84 08/25/2020   TRIG 105 08/25/2020   CHOLHDL 3.5 08/25/2020   Lab Results  Component Value Date   NA 142 08/25/2020   K 4.1 08/25/2020   CREATININE 0.48 (L) 08/25/2020   EGFR 104 08/25/2020     ---------------------------------------------------------------------------------------------------  Hypertension, follow-up  BP Readings from Last 3 Encounters:  03/30/21 (!) 125/59  12/16/20 (!) 142/84  12/01/20 (!) 145/82   Wt Readings from Last 3 Encounters:  03/30/21 231 lb (104.8 kg)  12/16/20 233 lb 14.4 oz (106.1 kg)  12/01/20 236 lb 3.2 oz (107.1 kg)     She was last seen for hypertension 3 months ago.  BP at that visit was 142/84. Management since  that visit includes none.  She reports excellent compliance with treatment. She is not having side effects.  She is following a Regular diet. She is not exercising. She does not smoke.  Use of agents associated with hypertension: NSAIDS.   Outside blood pressures are not checked. Symptoms: No chest pain No chest pressure  No palpitations No syncope  No dyspnea No orthopnea  No paroxysmal nocturnal dyspnea No lower extremity edema   Pertinent labs: Lab Results  Component Value Date   CHOL 144 08/25/2020   HDL 41 08/25/2020   LDLCALC 84 08/25/2020   TRIG 105 08/25/2020   CHOLHDL 3.5 08/25/2020   Lab Results  Component Value Date   NA 142 08/25/2020   K 4.1 08/25/2020   CREATININE 0.48 (L) 08/25/2020   EGFR 104 08/25/2020   GLUCOSE 163 (H) 08/25/2020   TSH 2.050 08/25/2020     The 10-year ASCVD risk score (Arnett DK, et al., 2019) is: 16.3%   ---------------------------------------------------------------------------------------------------   Medications: Outpatient Medications Prior to Visit  Medication Sig   aspirin 81 MG tablet Take 81 mg by mouth daily.   atorvastatin (LIPITOR) 20 MG tablet Take 1 tablet by mouth once daily   CALCIUM-VITAMIN D PO Take 800 mg by mouth daily.   carvedilol (COREG) 6.25 MG tablet Take 1 tablet (6.25 mg total) by mouth 2 (two) times daily.   cholecalciferol (VITAMIN  D) 1000 UNITS tablet Take 1,000 Units by mouth daily.   glucose blood (ONETOUCH VERIO) test strip Use as instructed   olopatadine (PATANOL) 0.1 % ophthalmic solution INSTILL 1 DROP INTO EACH EYE TWICE DAILY   [DISCONTINUED] empagliflozin (JARDIANCE) 25 MG TABS tablet Take 1 tablet (25 mg total) by mouth daily before breakfast.   [DISCONTINUED] glipiZIDE (GLUCOTROL) 10 MG tablet TAKE 1 TABLET BY MOUTH TWICE DAILY 30 MINUTES BEFORE A MEAL   [DISCONTINUED] meloxicam (MOBIC) 7.5 MG tablet Take 1 tablet (7.5 mg total) by mouth daily.   [DISCONTINUED] metFORMIN (GLUCOPHAGE) 1000  MG tablet Take 1 tablet (1,000 mg total) by mouth 2 (two) times daily.   [DISCONTINUED] quinapril (ACCUPRIL) 20 MG tablet Take 1 tablet (20 mg total) by mouth at bedtime.   [DISCONTINUED] linagliptin (TRADJENTA) 5 MG TABS tablet Take 1 tablet (5 mg total) by mouth daily.   [DISCONTINUED] Na Sulfate-K Sulfate-Mg Sulf (SUPREP BOWEL PREP KIT) 17.5-3.13-1.6 GM/177ML SOLN Take 1 kit by mouth as directed.   No facility-administered medications prior to visit.    Review of Systems     Objective    BP (!) 125/59    Pulse 88    Temp 98.9 F (37.2 C) (Oral)    Resp 16    Wt 231 lb (104.8 kg)    SpO2 98%    BMI 42.25 kg/m    Physical Exam Vitals and nursing note reviewed.  Constitutional:      General: She is not in acute distress.    Appearance: Normal appearance. She is obese. She is not ill-appearing, toxic-appearing or diaphoretic.  HENT:     Head: Normocephalic and atraumatic.  Cardiovascular:     Rate and Rhythm: Normal rate and regular rhythm.     Pulses: Normal pulses.     Heart sounds: Normal heart sounds. No murmur heard.   No friction rub. No gallop.  Pulmonary:     Effort: Pulmonary effort is normal. No respiratory distress.     Breath sounds: Normal breath sounds. No stridor. No wheezing, rhonchi or rales.  Chest:     Chest wall: No tenderness.  Abdominal:     General: Bowel sounds are normal.     Palpations: Abdomen is soft.  Musculoskeletal:        General: No swelling, tenderness, deformity or signs of injury. Normal range of motion.     Right lower leg: No edema.     Left lower leg: No edema.  Skin:    General: Skin is warm and dry.     Capillary Refill: Capillary refill takes less than 2 seconds.     Coloration: Skin is not jaundiced or pale.     Findings: No bruising, erythema, lesion or rash.  Neurological:     General: No focal deficit present.     Mental Status: She is alert and oriented to person, place, and time. Mental status is at baseline.     Cranial  Nerves: No cranial nerve deficit.     Sensory: No sensory deficit.     Motor: No weakness.     Coordination: Coordination normal.  Psychiatric:        Mood and Affect: Mood normal.        Behavior: Behavior normal.        Thought Content: Thought content normal.        Judgment: Judgment normal.     Results for orders placed or performed in visit on 03/30/21  POCT urinalysis dipstick  Result Value Ref Range   Color, UA yellow    Clarity, UA clear    Glucose, UA Positive (A) Negative   Bilirubin, UA negative    Ketones, UA negative    Spec Grav, UA 1.010 1.010 - 1.025   Blood, UA negatigve    pH, UA 5.0 5.0 - 8.0   Protein, UA Negative Negative   Urobilinogen, UA 0.2 0.2 or 1.0 E.U./dL   Nitrite, UA negative    Leukocytes, UA Negative Negative   Appearance     Odor      Assessment & Plan     Problem List Items Addressed This Visit       Cardiovascular and Mediastinum   Hypertension associated with diabetes ([place])    BP stable; continue to reinforce goal of <130/<80       Relevant Medications   metFORMIN (GLUCOPHAGE) 1000 MG tablet   glipiZIDE (GLUCOTROL) 10 MG tablet   lisinopril (ZESTRIL) 20 MG tablet   empagliflozin (JARDIANCE) 25 MG TABS tablet   Other Relevant Orders   Comprehensive metabolic panel     Endocrine   Type 2 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, without long-term current use of insulin (HCC) - Primary    A1c <8 Continue to work on diet and add exercise Pt report of out of medication for 3 wks; however, chart review showed no communication from pharmacy to clinic for metformin Encouraged to call after 72 hours if she does not get a response Pt in agreement      Relevant Medications   metFORMIN (GLUCOPHAGE) 1000 MG tablet   glipiZIDE (GLUCOTROL) 10 MG tablet   lisinopril (ZESTRIL) 20 MG tablet   empagliflozin (JARDIANCE) 25 MG TABS tablet   Other Relevant Orders   POCT glycosylated hemoglobin (Hb  A1C)   Comprehensive metabolic panel   Urine Microalbumin w/creat. ratio   Ambulatory referral to Podiatry   Hyperlipidemia associated with type 2 diabetes mellitus (HCC)    Repeat lipid panel Goal LDL <70      Relevant Medications   metFORMIN (GLUCOPHAGE) 1000 MG tablet   glipiZIDE (GLUCOTROL) 10 MG tablet   lisinopril (ZESTRIL) 20 MG tablet   empagliflozin (JARDIANCE) 25 MG TABS tablet   Other Relevant Orders   Lipid panel   RESOLVED: Diabetes mellitus without complication (HCC)   Relevant Medications   metFORMIN (GLUCOPHAGE) 1000 MG tablet   glipiZIDE (GLUCOTROL) 10 MG tablet   lisinopril (ZESTRIL) 20 MG tablet   empagliflozin (JARDIANCE) 25 MG TABS tablet     Other   Right wrist pain    Snuff box pain on palpation No limited ROM      Relevant Medications   meloxicam (MOBIC) 7.5 MG tablet   predniSONE (DELTASONE) 10 MG tablet   Other Relevant Orders   DG Wrist Complete Right   Fall    Fell a few weeks ago, when trimming rose bushes Reported that [facility] Slipped on brick Healing abrasions to bilateral hands Slight bruise remains to abdomen       Relevant Orders   DG Wrist Complete Right   Urinary frequency    No signs of infection Glucose positive Continue to reinforce diebetic diet and ensure medication compliance      Relevant Orders   POCT urinalysis dipstick (Completed)     Return in about 3 months (around 06/28/2021) for annual examination, chonic disease management.      Vonna Kotyk, FNP, have reviewed all documentation for this visit.  The documentation on 03/30/21 for the exam, diagnosis, procedures, and orders are all accurate and complete.  Patient seen and examined by Tally Joe,  FNP note scribed by [employer], [place], [place] [phone] (phone) [phone] (fax)  [facility]

## 2021-03-30 NOTE — Assessment & Plan Note (Signed)
BP stable; continue to reinforce goal of <130/<80

## 2021-03-31 LAB — COMPREHENSIVE METABOLIC PANEL
ALT: 11 IU/L (ref 0–32)
AST: 13 IU/L (ref 0–40)
Albumin/Globulin Ratio: 1.7 (ref 1.2–2.2)
Albumin: 4.5 g/dL (ref 3.8–4.8)
Alkaline Phosphatase: 86 IU/L (ref 44–121)
BUN/Creatinine Ratio: 15 (ref 12–28)
BUN: 8 mg/dL (ref 8–27)
Bilirubin Total: 0.2 mg/dL (ref 0.0–1.2)
CO2: 22 mmol/L (ref 20–29)
Calcium: 9.6 mg/dL (ref 8.7–10.3)
Chloride: 104 mmol/L (ref 96–106)
Creatinine, Ser: 0.52 mg/dL — ABNORMAL LOW (ref 0.57–1.00)
Globulin, Total: 2.7 g/dL (ref 1.5–4.5)
Glucose: 194 mg/dL — ABNORMAL HIGH (ref 70–99)
Potassium: 4.2 mmol/L (ref 3.5–5.2)
Sodium: 144 mmol/L (ref 134–144)
Total Protein: 7.2 g/dL (ref 6.0–8.5)
eGFR: 102 mL/min/{1.73_m2} (ref >59)

## 2021-03-31 LAB — LIPID PANEL
Chol/HDL Ratio: 3.3 ratio (ref 0.0–4.4)
Cholesterol, Total: 132 mg/dL (ref 100–199)
HDL: 40 mg/dL (ref >39)
LDL Chol Calc (NIH): 73 mg/dL (ref 0–99)
Triglycerides: 102 mg/dL (ref 0–149)
VLDL Cholesterol Cal: 19 mg/dL (ref 5–40)

## 2021-03-31 LAB — MICROALBUMIN / CREATININE URINE RATIO
Creatinine, Urine: 40.6 mg/dL
Microalb/Creat Ratio: 18 mg/g creat (ref 0–29)
Microalbumin, Urine: 7.2 ug/mL

## 2021-04-01 ENCOUNTER — Telehealth: Payer: Self-pay

## 2021-04-01 NOTE — Telephone Encounter (Signed)
Copied from [place] [id number]. Topic: General - Other >> Apr 01, 2021 10:30 AM Chelsea Gomez A wrote: Reason for CRM: The patient would like to be contacted by a member of clinical staff when possible about results from their recent lab work 03/30/21  Please contact further when possible

## 2021-04-01 NOTE — Telephone Encounter (Signed)
LMTCB, okay for [facility] nurse triage to advise patient of PCP message below. KW   Hi Ms Chelsea Gomez,   Kidney and liver function is normal; outside of elevated blood glucose.   Cholesterol looks great! We are almost at goal of bad/LDL cholesterol <70; it was 73. I think we can make it there with dietary changes as we just survived the holidays. I will hold off on medication changes.   I will get your urine labs back to you when the result.   It was a pleasure to see you in the office the other day.   Please let us know if you have any questions.   Thank you,   Tally Joe, FNP

## 2021-04-04 NOTE — Telephone Encounter (Signed)
Pt given lab results per notes of Tally Joe, NP on 03/31/21. Pt verbalized understanding. She asked for the urine and x-ray results. Results of both given per notes of Tally Joe, NP on 03/31/21. Patient verbalized understanding.  Hi Ms Jeane,   Kidney and liver function is normal; outside of elevated blood glucose.   Cholesterol looks great! We are almost at goal of bad/LDL cholesterol <70; it was 73. I think we can make it there with dietary changes as we just survived the holidays. I will hold off on medication changes.   I will get your urine labs back to you when the result.   It was a pleasure to see you in the office the other day.   Please let us know if you have any questions.   Thank you,   Tally Joe, FNP  Written by Gwyneth Sprout, FNP on 03/31/2021  7:57 AM EST Normal urine. Very encouraging. Keep up the good work.   Please let us know if you have any questions.  ...  Written by Gwyneth Sprout, FNP on 03/31/2021  5:05 PM EST   No break or healing break, fracture; arthritis of the bone, thumb seen where the three bones connect in the wrist.   Continue anti-inflammatory medications to assist.   Please let us know if you have any questions.   Thank you,   Tally Joe, FNP  Written by Gwyneth Sprout, FNP on 03/31/2021  3:41 PM EST

## 2021-04-06 ENCOUNTER — Other Ambulatory Visit: Payer: Self-pay

## 2021-04-06 ENCOUNTER — Encounter: Payer: Self-pay | Admitting: Podiatrist

## 2021-04-06 ENCOUNTER — Ambulatory Visit (INDEPENDENT_AMBULATORY_CARE_PROVIDER_SITE_OTHER): Payer: Medicare Other | Admitting: Podiatrist

## 2021-04-06 DIAGNOSIS — E119 Type 2 diabetes mellitus without complications: Secondary | ICD-10-CM | POA: Diagnosis not present

## 2021-04-06 MED ORDER — DOXYCYCLINE HYCLATE 100 MG PO TABS: 100.0000 mg | ORAL_TABLET | Freq: Two times a day (BID) | ORAL | 0 refills | Status: DC

## 2021-04-06 NOTE — Progress Notes (Signed)
Subjective: Chelsea Gomez is a 66 y.o. female patient with history of diabetes who presents to office today for a diabetic foot evaluation.  Patient states that she doesn't check her glucose daily and her last HgA1c was 7.8.    Patient denies any new cramping, numbness, burning or tingling in the legs.  Patient Active Problem List   Diagnosis Date Noted   Hyperlipidemia associated with type 2 diabetes mellitus ([place]) 03/30/2021   Hypertension associated with diabetes ([place]) 03/30/2021   Right wrist pain 03/30/2021   Fall 03/30/2021   Urinary frequency 03/30/2021   Hx of colonic polyps    Benign neoplasm of cecum    Type 2 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, without long-term current use of insulin ([place]) 04/23/2019   Morbid obesity ([place]) 12/19/2018   Mild nonproliferative diabetic retinopathy ([place]) 08/07/2018   GERD (gastroesophageal reflux disease) 08/14/2014   Hyperlipidemia    Hypertension    Lumbar radiculitis 06/19/2012   Current Outpatient Medications on File Prior to Visit  Medication Sig Dispense Refill   aspirin 81 MG tablet Take 81 mg by mouth daily.     atorvastatin (LIPITOR) 20 MG tablet Take 1 tablet by mouth once daily 90 tablet 3   CALCIUM-VITAMIN D PO Take 800 mg by mouth daily.     carvedilol (COREG) 6.25 MG tablet Take 1 tablet (6.25 mg total) by mouth 2 (two) times daily. 180 tablet 1   cholecalciferol (VITAMIN D) 1000 UNITS tablet Take 1,000 Units by mouth daily.     empagliflozin (JARDIANCE) 25 MG TABS tablet Take 1 tablet (25 mg total) by mouth daily before breakfast. 90 tablet 3   glipiZIDE (GLUCOTROL) 10 MG tablet TAKE 1 TABLET BY MOUTH TWICE DAILY 30 MINUTES BEFORE A MEAL 180 tablet 3   glucose blood (ONETOUCH VERIO) test strip Use as instructed 100 each 12   lisinopril (ZESTRIL) 20 MG tablet Take 2 tablets (40 mg total) by mouth daily. 180 tablet 3   meloxicam (MOBIC) 7.5 MG tablet Take 1 tablet (7.5 mg total) by mouth  daily. 30 tablet 0   metFORMIN (GLUCOPHAGE) 1000 MG tablet Take 1 tablet (1,000 mg total) by mouth 2 (two) times daily. 180 tablet 3   olopatadine (PATANOL) 0.1 % ophthalmic solution INSTILL 1 DROP INTO EACH EYE TWICE DAILY  3   predniSONE (DELTASONE) 10 MG tablet Take 1 tablet (10 mg total) by mouth daily with breakfast. 5 tablet 0   No current facility-administered medications on file prior to visit.   Allergies  Allergen Reactions   Mevacor [Lovastatin] Other (See Comments)    Muscle cramps    Objective: General: Patient is awake, alert, and oriented x 3 and in no acute distress.  Integument: Skin is warm, dry and supple bilateral. Digital nails are unremarkable with no evidence of ingrown toenail and no signs of infection. No open lesions or preulcerative lesions present bilateral. Remaining integument unremarkable.  Vasculature:  Dorsalis Pedis pulse 2/4 bilateral. Posterior Tibial pulse  1/4 bilateral.  Capillary fill time <3 sec 1-5 bilateral. Positive hair growth to the level of the digits. Temperature gradient within normal limits. Mild varicosities present bilateral. Minimal ankle edema present bilateral.   Neurology: The patient has intact sensation measured with a 5.07/10g Semmes Weinstein Monofilament at all pedal sites bilateral (10/10 sites) . Vibratory sensation intact bilateral with tuning fork. No Babinski sign present bilateral.   Musculoskeletal: No symptomatic pedal deformities noted bilateral. Muscular strength 5/5 in all lower  extremity muscular groups bilateral without pain on range of motion . No tenderness with calf compression bilateral.  Assessment and Plan: Problem List Items Addressed This Visit   None Visit Diagnoses     Encounter for diabetic foot exam ([place])    -  Primary       -Examined patient. -Discussed and educated patient on diabetic foot care, especially with  regards to the vascular, neurological and musculoskeletal systems.  -Stressed  the importance of good glycemic control and the detriment of not  controlling glucose levels in relation to the foot. -Patient advised to call the office if any problems or questions arise in the future.

## 2021-04-06 NOTE — Patient Instructions (Signed)

## 2021-04-12 ENCOUNTER — Telehealth: Payer: Self-pay

## 2021-04-12 NOTE — Telephone Encounter (Signed)
Contacted patient who stated that she has been advised by our staff of results. KW

## 2021-04-12 NOTE — Telephone Encounter (Signed)
Copied from [place] [id number]. Topic: Quick Communication - Lab Results (Clinic Use ONLY) >> Apr 04, 2021  8:24 AM Elenora Fender, Laverda Sorenson, [place] wrote: Patient would like a call back regarding her lab & x-ray results. >> Apr 04, 2021  8:27 AM Elenora Fender, Kristeen Miss A, [place] wrote: Patient called stating that her MyChart was acting up and was unable to access lab/ xray results. She is asking for a return call. Please call on cell [phone].

## 2021-05-04 ENCOUNTER — Ambulatory Visit: Payer: Self-pay

## 2021-05-04 ENCOUNTER — Ambulatory Visit (INDEPENDENT_AMBULATORY_CARE_PROVIDER_SITE_OTHER): Payer: Medicare Other | Admitting: Family Medicine

## 2021-05-04 ENCOUNTER — Other Ambulatory Visit: Payer: Self-pay

## 2021-05-04 ENCOUNTER — Encounter: Payer: Self-pay | Admitting: Family Medicine

## 2021-05-04 VITALS — BP 153/61 | HR 98 | Resp 16 | Wt 231.0 lb

## 2021-05-04 DIAGNOSIS — R3915 Urgency of urination: Secondary | ICD-10-CM

## 2021-05-04 DIAGNOSIS — M545 Low back pain, unspecified: Secondary | ICD-10-CM

## 2021-05-04 DIAGNOSIS — R142 Eructation: Secondary | ICD-10-CM | POA: Diagnosis not present

## 2021-05-04 DIAGNOSIS — R310 Gross hematuria: Secondary | ICD-10-CM | POA: Diagnosis not present

## 2021-05-04 LAB — POCT URINALYSIS DIPSTICK
Glucose, UA: NEGATIVE
Ketones, UA: NEGATIVE
Leukocytes, UA: NEGATIVE
Nitrite, UA: NEGATIVE
Protein, UA: NEGATIVE
Spec Grav, UA: 1.01 (ref 1.010–1.025)
Urobilinogen, UA: 1 E.U./dL
pH, UA: 8.5 — AB (ref 5.0–8.0)

## 2021-05-04 NOTE — Telephone Encounter (Signed)
? ?  Chief Complaint: Urine "is pink." Low back pain ?Symptoms: Above ?Frequency: Started 2 days ago ?Pertinent Negatives: Patient denies fever ?Disposition: [] ED /[] Urgent Care (no appt availability in office) / [x] Appointment(In office/virtual)/ []  [place] [facility]/ [] Home Care/ [] Refused Recommended Disposition /[] [place] [facility]/ []  Follow-up with PCP ?Additional Notes:   ?Reason for Disposition ? Side (flank) or back pain present ? ?Answer Assessment - Initial Assessment Questions ?1. COLOR of URINE: "Describe the color of the urine."  (e.g., tea-colored, pink, red, blood clots, bloody) ?    Pink ?2. ONSET: "When did the bleeding start?"  ?    2 days ?3. EPISODES: "How many times has there been blood in the urine?" or "How many times today?" ?    Almost every time ?4. PAIN with URINATION: "Is there any pain with passing your urine?" If Yes, ask: "How bad is the pain?"  (Scale 1-10; or mild, moderate, severe) ?   - MILD - complains slightly about urination hurting ?   - MODERATE - interferes with normal activities   ?   - SEVERE - excruciating, unwilling or unable to urinate because of the pain  ?    No ?5. FEVER: "Do you have a fever?" If Yes, ask: "What is your temperature, how was it measured, and when did it start?" ?    No ?6. ASSOCIATED SYMPTOMS: "Are you passing urine more frequently than usual?" ?    Urgency ?7. OTHER SYMPTOMS: "Do you have any other symptoms?" (e.g., back/flank pain, abdominal pain, vomiting) ?    Back pain ?8. PREGNANCY: "Is there any chance you are pregnant?" "When was your last menstrual period?" ?    No ? ?Protocols used: Urine - Blood In-A-AH ? ?

## 2021-05-04 NOTE — Progress Notes (Signed)
?  ? ?I,Roshena L Chambers,acting as a scribe for Lelon Huh, MD.,have documented all relevant documentation on the behalf of Lelon Huh, MD,as directed by  Lelon Huh, MD while in the presence of Lelon Huh, MD.  ? ? ?Established patient visit ? ? ?Patient: Chelsea Gomez   DOB: 12/19/1955   66 y.o. Female  MRN: [id number] ?Visit Date: 05/04/2021 ? ?Today's healthcare provider: Lelon Huh, MD  ? ?Chief Complaint  ?Patient presents with  ? Urinary Urgency  ? ?Subjective  ?  ?HPI  ?Urinary urgency: ?Patient reports having urinary urgency and lower back pain for 2 days. She also states that her urine was pink in the toilet bowl yesterday. No pain with urination. ? ?Has also had some right low back pain for a few weeks.  ? ?Patient complains of excessive belching for the last week.  ? ?No change in diet   ? ? ? ?Medications: ?Outpatient Medications Prior to Visit  ?Medication Sig  ? aspirin 81 MG tablet Take 81 mg by mouth daily.  ? atorvastatin (LIPITOR) 20 MG tablet Take 1 tablet by mouth once daily  ? CALCIUM-VITAMIN D PO Take 800 mg by mouth daily.  ? carvedilol (COREG) 6.25 MG tablet Take 1 tablet (6.25 mg total) by mouth 2 (two) times daily.  ? cholecalciferol (VITAMIN D) 1000 UNITS tablet Take 1,000 Units by mouth daily.  ? empagliflozin (JARDIANCE) 25 MG TABS tablet Take 1 tablet (25 mg total) by mouth daily before breakfast.  ? glipiZIDE (GLUCOTROL) 10 MG tablet TAKE 1 TABLET BY MOUTH TWICE DAILY 30 MINUTES BEFORE A MEAL  ? glucose blood (ONETOUCH VERIO) test strip Use as instructed  ? linagliptin (TRADJENTA) 5 MG TABS tablet Take 5 mg by mouth daily.  ? meloxicam (MOBIC) 7.5 MG tablet Take 1 tablet (7.5 mg total) by mouth daily.  ? metFORMIN (GLUCOPHAGE) 1000 MG tablet Take 1 tablet (1,000 mg total) by mouth 2 (two) times daily.  ? olopatadine (PATANOL) 0.1 % ophthalmic solution INSTILL 1 DROP INTO EACH EYE TWICE DAILY  ? quinapril (ACCUPRIL) 20 MG tablet Take 20 mg by mouth at bedtime.  ?  lisinopril (ZESTRIL) 20 MG tablet Take 2 tablets (40 mg total) by mouth daily. (Patient not taking: Reported on 05/04/2021)  ? [DISCONTINUED] predniSONE (DELTASONE) 10 MG tablet Take 1 tablet (10 mg total) by mouth daily with breakfast. (Patient not taking: Reported on 05/04/2021)  ? ?No facility-administered medications prior to visit.  ? ? ?Review of Systems  ?Constitutional:  Negative for appetite change, chills, fatigue and fever.  ?Respiratory:  Negative for chest tightness and shortness of breath.   ?Cardiovascular:  Negative for chest pain and palpitations.  ?Gastrointestinal:  Negative for abdominal pain, nausea and vomiting.  ?Genitourinary:   ?     Pink colored urine and urgency  ?Neurological:  Negative for dizziness and weakness.  ? ? ?  Objective  ?  ?BP (!) 153/61 (BP Location: Left Arm, Patient Position: Sitting, Cuff Size: Large)   Pulse 98   Resp 16   Wt 231 lb (104.8 kg)   SpO2 99% Comment: room air  BMI 42.25 kg/m?  ? ?Today's Vitals  ? 05/04/21 1120 05/04/21 1122  ?BP: (!) 152/71 (!) 153/61  ?Pulse: 98   ?Resp: 16   ?SpO2: 99%   ?Weight: 231 lb (104.8 kg)   ? ?Body mass index is 42.25 kg/m?.  ? ? ?Physical Exam  ? ? ?General: Appearance:    Severely obese female in no  acute distress  ?Eyes:    PERRL, conjunctiva/corneas clear, EOM's intact       ?Lungs:     Clear to auscultation bilaterally, respirations unlabored  ?GI:   Occasional prominent vocal belching.   ?Heart:    Normal heart rate. Normal rhythm. No murmurs, rubs, or gallops.    ?MS:   All extremities are intact.  Tender mid back bilaterally.   ?Neurologic:   Awake, alert, oriented x 3. No apparent focal neurological defect.   ?   ? ?Results for orders placed or performed in visit on 05/04/21  ?POCT Urinalysis Dipstick  ?Result Value Ref Range  ? Color, UA clear   ? Clarity, UA yellow   ? Glucose, UA Negative Negative  ? Bilirubin, UA small   ? Ketones, UA negative   ? Spec Grav, UA 1.010 1.010 - 1.025  ? Blood, UA trace (non hemolyzed)    ? pH, UA 8.5 (A) 5.0 - 8.0  ? Protein, UA Negative Negative  ? Urobilinogen, UA 1.0 0.2 or 1.0 E.U./dL  ? Nitrite, UA negative   ? Leukocytes, UA Negative Negative  ? Appearance    ? Odor    ?  ? ? Assessment & Plan  ?  ? ?1. Gross hematuria ? ?- US Renal; Future ?- Urine Culture ? ?2. Eructation ? ?- US Abdomen Complete; Future ? ?3. Urinary urgency ? ?- US Abdomen Complete; Future ?- US Renal; Future ?- Urine Culture ? ?4. Acute right-sided low back pain without sciatica ? ? ?   ? ?The entirety of the information documented in the History of Present Illness, Review of Systems and Physical Exam were personally obtained by me. Portions of this information were initially documented by the CMA and reviewed by me for thoroughness and accuracy.   ? ? ?Lelon Huh, MD  ?[facility] ?[phone] (phone) ?[phone] (fax) ? ?[facility]  ?

## 2021-05-07 LAB — URINE CULTURE

## 2021-05-13 ENCOUNTER — Ambulatory Visit
Admission: RE | Admit: 2021-05-13 | Discharge: 2021-05-13 | Disposition: A | Discharge status: Home / Self Care | Payer: Medicare Other | Source: Ambulatory Visit | Attending: Family Medicine | Admitting: Family Medicine

## 2021-05-13 ENCOUNTER — Other Ambulatory Visit: Payer: Self-pay

## 2021-05-13 ENCOUNTER — Ambulatory Visit: Payer: Medicare Other

## 2021-05-13 DIAGNOSIS — R310 Gross hematuria: Secondary | ICD-10-CM | POA: Diagnosis present

## 2021-05-13 DIAGNOSIS — R3915 Urgency of urination: Secondary | ICD-10-CM | POA: Diagnosis present

## 2021-05-13 DIAGNOSIS — R142 Eructation: Secondary | ICD-10-CM | POA: Diagnosis present

## 2021-05-17 ENCOUNTER — Telehealth: Payer: Self-pay

## 2021-05-17 NOTE — Telephone Encounter (Signed)
Copied from [place] [id number]. Topic: General - Call Back - No Documentation ?>> May 17, 2021  9:57 AM Erick Blinks wrote: ?Reason for CRM: Pt wants to discuss her lab results, please advise if [facility] may disclose.  ? ?Best contact: [phone] ?>> May 17, 2021  9:58 AM Erick Blinks wrote: ?Imaging results, not labs  ?

## 2021-05-18 LAB — HM DIABETES EYE EXAM

## 2021-05-18 NOTE — Telephone Encounter (Signed)
Pt called in about imaging results. I let her know it's waiting for review. ?

## 2021-05-18 NOTE — Telephone Encounter (Signed)
The pelvis (transabdominal) ultrasound has been reviewed and resulted By Tally Joe, FNP.  Chart shows that she reviewed Chelsea Gomez's message via mychart.  ? ?It looks like the abdominal ultrasound  has not been review. Please review and advise of recommendations. Abdominal ultrasound impression states:  ?IMPRESSION: ?1. No acute abnormality. ?2. Nonobstructive 8 mm left renal stone. No hydronephrosis. ?  ?

## 2021-05-19 ENCOUNTER — Telehealth: Payer: Self-pay

## 2021-05-19 NOTE — Telephone Encounter (Signed)
Patient advised and verbalized understanding. Patient denies seeing anymore blood.  ? ?Patient reports she is still having belching and gas. She would like to proceed with GI referral. Please schedule appt.  ?

## 2021-05-19 NOTE — Telephone Encounter (Signed)
-----   Message from Birdie Sons, MD sent at 05/18/2021  3:42 PM EDT ----- ?Ultrasound shows small stone on left ,is almost certainly the source of blood she saw. If she has any more blood in urine she should call for urology referral.  ?Otherwise ultrasound of abdomen is normal. No explanation for gas or belching. Recommend referral to GI if she is still having this. Also recommend she try gluten free and lactose free diet for a few weeks.  ? ?

## 2021-05-23 ENCOUNTER — Telehealth: Payer: Self-pay

## 2021-05-23 NOTE — Telephone Encounter (Signed)
Scheduled for 09/01/2021 ?

## 2021-06-06 ENCOUNTER — Ambulatory Visit (INDEPENDENT_AMBULATORY_CARE_PROVIDER_SITE_OTHER): Payer: Medicare Other

## 2021-06-06 VITALS — Wt 231.0 lb

## 2021-06-06 DIAGNOSIS — Z0000 Encounter for general adult medical examination without abnormal findings: Secondary | ICD-10-CM | POA: Diagnosis not present

## 2021-06-06 NOTE — Patient Instructions (Addendum)
Ms. Marner , ?Thank you for taking time to come for your Medicare Wellness Visit. I appreciate your ongoing commitment to your health goals. Please review the following plan we discussed and let me know if I can assist you in the future.  ? ?Screening recommendations/referrals: ?Colonoscopy: 08/30/20 ?Mammogram: 02/24/21 ?Bone Density: had one done last year at a health fair- was normal ?Recommended yearly ophthalmology/optometry visit for glaucoma screening and checkup ?Recommended yearly dental visit for hygiene and checkup ? ?Vaccinations: ?Influenza vaccine: 12/16/20 ?Pneumococcal vaccine: 12/17/19 ?Tdap vaccine: 09/18/18 ?Shingles vaccine: n.d   ?Covid-19:05/29/19, 06/26/19, 02/11/20 ? ?Advanced directives: no ? ?Conditions/risks identified: none ? ?Next appointment: Follow up in one year for your annual wellness visit - 06/08/22 @ 3:05pm by phone ? ? ?Preventive Care 67 Years and Older, Female ?Preventive care refers to lifestyle choices and visits with your health care provider that can promote health and wellness. ?What does preventive care include? ?A yearly physical exam. This is also called an annual well check. ?Dental exams once or twice a year. ?Routine eye exams. Ask your health care provider how often you should have your eyes checked. ?Personal lifestyle choices, including: ?Daily care of your teeth and gums. ?Regular physical activity. ?Eating a healthy diet. ?Avoiding tobacco and drug use. ?Limiting alcohol use. ?Practicing safe sex. ?Taking low-dose aspirin every day. ?Taking vitamin and mineral supplements as recommended by your health care provider. ?What happens during an annual well check? ?The services and screenings done by your health care provider during your annual well check will depend on your age, overall health, lifestyle risk factors, and family history of disease. ?Counseling  ?Your health care provider may ask you questions about your: ?Alcohol use. ?Tobacco use. ?Drug use. ?Emotional  well-being. ?Home and relationship well-being. ?Sexual activity. ?Eating habits. ?History of falls. ?Memory and ability to understand (cognition). ?Work and work Statistician. ?Reproductive health. ?Screening  ?You may have the following tests or measurements: ?Height, weight, and BMI. ?Blood pressure. ?Lipid and cholesterol levels. These may be checked every 5 years, or more frequently if you are over 42 years old. ?Skin check. ?Lung cancer screening. You may have this screening every year starting at age 23 if you have a 30-pack-year history of smoking and currently smoke or have quit within the past 15 years. ?Fecal occult blood test (FOBT) of the stool. You may have this test every year starting at age 29. ?Flexible sigmoidoscopy or colonoscopy. You may have a sigmoidoscopy every 5 years or a colonoscopy every 10 years starting at age 17. ?Hepatitis C blood test. ?Hepatitis B blood test. ?Sexually transmitted disease (STD) testing. ?Diabetes screening. This is done by checking your blood sugar (glucose) after you have not eaten for a while (fasting). You may have this done every 1-3 years. ?Bone density scan. This is done to screen for osteoporosis. You may have this done starting at age 97. ?Mammogram. This may be done every 1-2 years. Talk to your health care provider about how often you should have regular mammograms. ?Talk with your health care provider about your test results, treatment options, and if necessary, the need for more tests. ?Vaccines  ?Your health care provider may recommend certain vaccines, such as: ?Influenza vaccine. This is recommended every year. ?Tetanus, diphtheria, and acellular pertussis (Tdap, Td) vaccine. You may need a Td booster every 10 years. ?Zoster vaccine. You may need this after age 76. ?Pneumococcal 13-valent conjugate (PCV13) vaccine. One dose is recommended after age 50. ?Pneumococcal polysaccharide (PPSV23) vaccine. One dose  is recommended after age 65. ?Talk to your  health care provider about which screenings and vaccines you need and how often you need them. ?This information is not intended to replace advice given to you by your health care provider. Make sure you discuss any questions you have with your health care provider. ?Document Released: 03/19/2015 Document Revised: 11/10/2015 Document Reviewed: 12/22/2014 ?Elsevier Interactive Patient Education ? 2017 [place]. ? ?Fall Prevention in the Home ?Falls can cause injuries. They can happen to people of all ages. There are many things you can do to make your home safe and to help prevent falls. ?What can I do on the outside of my home? ?Regularly fix the edges of walkways and driveways and fix any cracks. ?Remove anything that might make you trip as you walk through a door, such as a raised step or threshold. ?Trim any bushes or trees on the path to your home. ?Use bright outdoor lighting. ?Clear any walking paths of anything that might make someone trip, such as rocks or tools. ?Regularly check to see if handrails are loose or broken. Make sure that both sides of any steps have handrails. ?Any raised decks and porches should have guardrails on the edges. ?Have any leaves, snow, or ice cleared regularly. ?Use sand or salt on walking paths during winter. ?Clean up any spills in your garage right away. This includes oil or grease spills. ?What can I do in the bathroom? ?Use night lights. ?Install grab bars by the toilet and in the tub and shower. Do not use towel bars as grab bars. ?Use non-skid mats or decals in the tub or shower. ?If you need to sit down in the shower, use a plastic, non-slip stool. ?Keep the floor dry. Clean up any water that spills on the floor as soon as it happens. ?Remove soap buildup in the tub or shower regularly. ?Attach bath mats securely with double-sided non-slip rug tape. ?Do not have throw rugs and other things on the floor that can make you trip. ?What can I do in the bedroom? ?Use night  lights. ?Make sure that you have a light by your bed that is easy to reach. ?Do not use any sheets or blankets that are too big for your bed. They should not hang down onto the floor. ?Have a firm chair that has side arms. You can use this for support while you get dressed. ?Do not have throw rugs and other things on the floor that can make you trip. ?What can I do in the kitchen? ?Clean up any spills right away. ?Avoid walking on wet floors. ?Keep items that you use a lot in easy-to-reach places. ?If you need to reach something above you, use a strong step stool that has a grab bar. ?Keep electrical cords out of the way. ?Do not use floor polish or wax that makes floors slippery. If you must use wax, use non-skid floor wax. ?Do not have throw rugs and other things on the floor that can make you trip. ?What can I do with my stairs? ?Do not leave any items on the stairs. ?Make sure that there are handrails on both sides of the stairs and use them. Fix handrails that are broken or loose. Make sure that handrails are as long as the stairways. ?Check any carpeting to make sure that it is firmly attached to the stairs. Fix any carpet that is loose or worn. ?Avoid having throw rugs at the top or bottom of the stairs.  If you do have throw rugs, attach them to the floor with carpet tape. ?Make sure that you have a light switch at the top of the stairs and the bottom of the stairs. If you do not have them, ask someone to add them for you. ?What else can I do to help prevent falls? ?Wear shoes that: ?Do not have high heels. ?Have rubber bottoms. ?Are comfortable and fit you well. ?Are closed at the toe. Do not wear sandals. ?If you use a stepladder: ?Make sure that it is fully opened. Do not climb a closed stepladder. ?Make sure that both sides of the stepladder are locked into place. ?Ask someone to hold it for you, if possible. ?Clearly mark and make sure that you can see: ?Any grab bars or handrails. ?First and last  steps. ?Where the edge of each step is. ?Use tools that help you move around (mobility aids) if they are needed. These include: ?Canes. ?Walkers. ?Scooters. ?Crutches. ?Turn on the lights when you go into a dark a

## 2021-06-06 NOTE — Progress Notes (Signed)
?Virtual Visit via Telephone Note ? ?I connected with  Chelsea Gomez on 06/06/21 at  2:00 PM EDT by telephone and verified that I am speaking with the correct person using two identifiers. ? ?Location: ?Patient: home ?Provider: [facility] ?Persons participating in the virtual visit: patient/Nurse Health Advisor ?  ?I discussed the limitations, risks, security and privacy concerns of performing an evaluation and management service by telephone and the availability of in person appointments. The patient expressed understanding and agreed to proceed. ? ?Interactive audio and video telecommunications were attempted between this nurse and patient, however failed, due to patient having technical difficulties OR patient did not have access to video capability.  We continued and completed visit with audio only. ? ?Some vital signs may be absent or patient reported.  ? ?Dionisio David, LPN ? ?Subjective:  ? Chelsea Gomez is a 66 y.o. female who presents for Medicare Annual (Subsequent) preventive examination. ? ?Review of Systems    ? ?  ? ?   ?Objective:  ?  ?There were no vitals filed for this visit. ?There is no height or weight on file to calculate BMI. ? ? ?  08/30/2020  ?  9:14 AM 04/20/2017  ?  7:30 AM 08/14/2014  ? 11:18 AM  ?Advanced Directives  ?Does Patient Have a Medical Advance Directive? No No Yes  ?Type of Advance Directive   Healthcare Power of Attorney  ?Does patient want to make changes to medical advance directive?   No - Patient declined  ?Copy of [place] in Chart?   No - copy requested  ?Would patient like information on creating a medical advance directive? No - Patient declined    ? ? ?Current Medications (verified) ?Outpatient Encounter Medications as of 06/06/2021  ?Medication Sig  ? aspirin 81 MG tablet Take 81 mg by mouth daily.  ? atorvastatin (LIPITOR) 20 MG tablet Take 1 tablet by mouth once daily  ? CALCIUM-VITAMIN D PO Take 800 mg by mouth daily.  ? carvedilol (COREG) 6.25 MG  tablet Take 1 tablet (6.25 mg total) by mouth 2 (two) times daily.  ? cholecalciferol (VITAMIN D) 1000 UNITS tablet Take 1,000 Units by mouth daily.  ? doxycycline (VIBRA-TABS) 100 MG tablet Take 100 mg by mouth 2 (two) times daily.  ? empagliflozin (JARDIANCE) 25 MG TABS tablet Take 1 tablet (25 mg total) by mouth daily before breakfast.  ? glipiZIDE (GLUCOTROL) 10 MG tablet TAKE 1 TABLET BY MOUTH TWICE DAILY 30 MINUTES BEFORE A MEAL  ? glucose blood (ONETOUCH VERIO) test strip Use as instructed  ? linagliptin (TRADJENTA) 5 MG TABS tablet Take 5 mg by mouth daily.  ? lisinopril (ZESTRIL) 20 MG tablet Take 2 tablets (40 mg total) by mouth daily. (Patient not taking: Reported on 05/04/2021)  ? meloxicam (MOBIC) 7.5 MG tablet Take 1 tablet (7.5 mg total) by mouth daily.  ? metFORMIN (GLUCOPHAGE) 1000 MG tablet Take 1 tablet (1,000 mg total) by mouth 2 (two) times daily.  ? olopatadine (PATANOL) 0.1 % ophthalmic solution INSTILL 1 DROP INTO EACH EYE TWICE DAILY  ? quinapril (ACCUPRIL) 20 MG tablet Take 20 mg by mouth at bedtime.  ? ?No facility-administered encounter medications on file as of 06/06/2021.  ? ? ?Allergies (verified) ?Mevacor [lovastatin]  ? ?History: ?Past Medical History:  ?Diagnosis Date  ? Arthritis   ? Left knee  ? Diabetes mellitus without complication ([place])   ? GERD (gastroesophageal reflux disease)   ? H/O multiple trauma   ?  Hyperlipidemia   ? Hypertension   ? Low back pain   ? Lung collapse   ? Shoulder pain   ? left  ? Thyroid disease   ? Wears partial dentures   ? ?Past Surgical History:  ?Procedure Laterality Date  ? BLADDER SURGERY    ? CESAREAN SECTION    ? COLONOSCOPY WITH PROPOFOL N/A 04/20/2017  ? Procedure: COLONOSCOPY WITH PROPOFOL;  Surgeon: Jonathon Bellows, MD;  Location: [facility] ENDOSCOPY;  Service: Gastroenterology;  Laterality: N/A;  ? COLONOSCOPY WITH PROPOFOL N/A 08/30/2020  ? Procedure: COLONOSCOPY WITH PROPOFOL;  Surgeon: Lucilla Lame, MD;  Location: [place];  Service:  Endoscopy;  Laterality: N/A;  ? POLYPECTOMY N/A 08/30/2020  ? Procedure: POLYPECTOMY;  Surgeon: Lucilla Lame, MD;  Location: [place];  Service: Endoscopy;  Laterality: N/A;  ? THYROID SURGERY    ? TOOTH EXTRACTION  2016  ? ?Family History  ?Problem Relation Age of Onset  ? Heart attack Mother   ? Stroke Mother   ? Diabetes Mother   ? Aneurysm Father   ? Breast cancer Sister   ? Diabetes Sister   ? Ovarian cancer Neg Hx   ? Colon cancer Neg Hx   ? ?Social History  ? ?Socioeconomic History  ? Marital status: Single  ?  Spouse name: Not on file  ? Number of children: Not on file  ? Years of education: Not on file  ? Highest education level: Not on file  ?Occupational History  ? Not on file  ?Tobacco Use  ? Smoking status: Former  ?  Types: Cigarettes  ? Smokeless tobacco: Never  ?Vaping Use  ? Vaping Use: Never used  ?Substance and Sexual Activity  ? Alcohol use: No  ? Drug use: No  ? Sexual activity: Not Currently  ?  Birth control/protection: Post-menopausal  ?Other Topics Concern  ? Not on file  ?Social History Narrative  ? Not on file  ? ?Social Determinants of Health  ? ?Financial Resource Strain: Not on file  ?Food Insecurity: Not on file  ?Transportation Needs: Not on file  ?Physical Activity: Not on file  ?Stress: Not on file  ?Social Connections: Not on file  ? ? ?Tobacco Counseling ?Counseling given: Not Answered ? ? ?Clinical Intake: ? ?Pre-visit preparation completed: Yes ? ?Pain : No/denies pain ? ?  ? ?Diabetes: Yes ?CBG done?: No ?Did pt. bring in CBG monitor from home?: No ? ?How often do you need to have someone help you when you read instructions, pamphlets, or other written materials from your doctor or pharmacy?: 1 - Never ? ?Diabetic?yes ?Nutrition Risk Assessment: ? ?Has the patient had any N/V/D within the last 2 months?  No  ?Does the patient have any non-healing wounds?  No  ?Has the patient had any unintentional weight loss or weight gain?  No  ? ?Diabetes: ? ?Is the patient  diabetic?  Yes  ?If diabetic, was a CBG obtained today?  No  ?Did the patient bring in their glucometer from home?  No  ?How often do you monitor your CBG's? Once per week.  ? ?Financial Strains and Diabetes Management: ? ?Are you having any financial strains with the device, your supplies or your medication? No .  ?Does the patient want to be seen by Chronic Care Management for management of their diabetes?  No  ?Would the patient like to be referred to a Nutritionist or for Diabetic Management?  No  ? ?Diabetic Exams: ? ?Diabetic Eye Exam:  Completed 05/18/21.  Pt has been advised about the importance in completing this exam.  ? ?Diabetic Foot Exam: Completed 08/13/19. Pt has been advised about the importance in completing this exam.  ?Interpreter Needed?: No ? ?Information entered by :: Kirke Shaggy, LPN ? ? ?Activities of Daily Living ? ?  08/30/2020  ?  8:54 AM 08/12/2020  ? 10:39 AM  ?In your present state of health, do you have any difficulty performing the following activities:  ?Hearing? 0 1  ?Vision? 0 1  ?Difficulty concentrating or making decisions? 0 0  ?Walking or climbing stairs? 0 0  ?Dressing or bathing? 0 0  ?Doing errands, shopping?  0  ? ? ?Patient Care Team: ?Gwyneth Sprout, FNP as PCP - General (Family Medicine) ? ?Indicate any recent Medical Services you may have received from other than [facility] providers in the past year (date may be approximate). ? ?   ?Assessment:  ? This is a routine wellness examination for Chelsea Gomez. ? ?Hearing/Vision screen ?No results found. ? ?Dietary issues and exercise activities discussed: ?  ? ? Goals Addressed   ?None ?  ? ?Depression Screen ? ?  08/12/2020  ? 10:39 AM 05/22/2019  ?  4:48 PM 03/11/2019  ? 12:04 PM 09/18/2018  ?  9:13 AM 03/14/2018  ? 11:33 AM  ?PHQ 2/9 Scores  ?PHQ - 2 Score 0 0 0 0 0  ?PHQ- 9 Score 0   2   ?  ?Fall Risk ? ?  08/12/2020  ? 10:39 AM 05/22/2019  ?  4:49 PM 03/11/2019  ? 12:03 PM 09/18/2018  ?  9:14 AM 03/14/2018  ? 11:33 AM  ?Fall Risk   ?Falls in the past  year? 0 0 0 0 0  ?Number falls in past yr: 0   0   ?Injury with Fall? 0   0   ?Risk for fall due to : No Fall Risks  Other (Comment)    ?Follow up Falls evaluation completed  Falls prevention discussed    ? ? ?FALL RISK P

## 2021-06-22 ENCOUNTER — Other Ambulatory Visit: Payer: Self-pay | Admitting: Family Medicine

## 2021-06-22 ENCOUNTER — Telehealth: Payer: Self-pay | Admitting: Family Medicine

## 2021-06-22 DIAGNOSIS — E113299 Type 2 diabetes mellitus with mild nonproliferative diabetic retinopathy without macular edema, unspecified eye: Secondary | ICD-10-CM

## 2021-06-22 MED ORDER — LINAGLIPTIN 5 MG PO TABS: 5.0000 mg | ORAL_TABLET | Freq: Every day | ORAL | 1 refills | Status: DC

## 2021-06-22 NOTE — Telephone Encounter (Signed)
[facility] faxed refill request for the following medications: ? ?linagliptin (TRADJENTA) 5 MG TABS tablet  ? ?Please advise. ? ?

## 2021-06-22 NOTE — Telephone Encounter (Signed)
Patient unsure if she is taking lisinopril. Patient will call back to let us know.  ?

## 2021-06-28 NOTE — Progress Notes (Signed)
? ? ?I,Chelsea Gomez,acting as a scribe for Chelsea Sprout, FNP.,have documented all relevant documentation on the behalf of Chelsea Sprout, FNP,as directed by  Chelsea Sprout, FNP while in the presence of Chelsea Sprout, FNP. ? ? ?Complete physical exam ? ? ?Patient: Chelsea Gomez   DOB: [birth year]   66 y.o. Female  MRN: [id number] ?Visit Date: 06/29/2021 ? ?Today's healthcare provider: Gwyneth Sprout, FNP  ?Re Introduced to nurse practitioner role and practice setting.  All questions answered.  Discussed provider/patient relationship and expectations. ? ? ?Chief Complaint  ?Patient presents with  ? Annual Exam  ? ?Subjective  ?  ?HPI  ?Chelsea Gomez is a 66 y.o. female who presents today for a complete physical exam.  ?She reports consuming a general diet. The patient does not participate in regular exercise at present. She generally feels well. She reports sleeping well. She does have additional problems to discuss today.  ? ?NHA- 06/06/2021 ? ?Last reported ?Pap-12/01/20 ?Mammo- ordered 12/01/20 (Order placed by [facility])  ?Colonoscopy- 08/30/20 ? ?Past Medical History:  ?Diagnosis Date  ? Arthritis   ? Left knee  ? Diabetes mellitus without complication ([place])   ? GERD (gastroesophageal reflux disease)   ? H/O multiple trauma   ? Hyperlipidemia   ? Hypertension   ? Low back pain   ? Lung collapse   ? Shoulder pain   ? left  ? Thyroid disease   ? Wears partial dentures   ? ?Past Surgical History:  ?Procedure Laterality Date  ? BLADDER SURGERY    ? CESAREAN SECTION    ? COLONOSCOPY WITH PROPOFOL N/A 04/20/2017  ? Procedure: COLONOSCOPY WITH PROPOFOL;  Surgeon: Jonathon Bellows, MD;  Location: [facility];  Service: Gastroenterology;  Laterality: N/A;  ? COLONOSCOPY WITH PROPOFOL N/A 08/30/2020  ? Procedure: COLONOSCOPY WITH PROPOFOL;  Surgeon: Lucilla Lame, MD;  Location: [place];  Service: Endoscopy;  Laterality: N/A;  ? POLYPECTOMY N/A 08/30/2020  ? Procedure: POLYPECTOMY;  Surgeon: Lucilla Lame, MD;   Location: [place];  Service: Endoscopy;  Laterality: N/A;  ? THYROID SURGERY    ? TOOTH EXTRACTION  2016  ? ?Social History  ? ?Socioeconomic History  ? Marital status: Single  ?  Spouse name: Not on file  ? Number of children: Not on file  ? Years of education: Not on file  ? Highest education level: Not on file  ?Occupational History  ? Not on file  ?Tobacco Use  ? Smoking status: Former  ?  Types: Cigarettes  ? Smokeless tobacco: Never  ?Vaping Use  ? Vaping Use: Never used  ?Substance and Sexual Activity  ? Alcohol use: No  ? Drug use: No  ? Sexual activity: Not Currently  ?  Birth control/protection: Post-menopausal  ?Other Topics Concern  ? Not on file  ?Social History Narrative  ? Not on file  ? ?Social Determinants of Health  ? ?Financial Resource Strain: Low Risk   ? Difficulty of Paying Living Expenses: Not hard at all  ?Food Insecurity: No Food Insecurity  ? Worried About Charity fundraiser in the Last Year: Never true  ? Ran Out of Food in the Last Year: Never true  ?Transportation Needs: No Transportation Needs  ? Lack of Transportation (Medical): No  ? Lack of Transportation (Non-Medical): No  ?Physical Activity: Inactive  ? Days of Exercise per Week: 0 days  ? Minutes of Exercise per Session: 0 min  ?Stress: No Stress  Concern Present  ? Feeling of Stress : Not at all  ?Social Connections: Moderately Integrated  ? Frequency of Communication with Friends and Family: More than three times a week  ? Frequency of Social Gatherings with Friends and Family: More than three times a week  ? Attends Religious Services: More than 4 times per year  ? Active Member of Clubs or Organizations: Yes  ? Attends Archivist Meetings: 1 to 4 times per year  ? Marital Status: Never married  ?Intimate Partner Violence: Not At Risk  ? Fear of Current or Ex-Partner: No  ? Emotionally Abused: No  ? Physically Abused: No  ? Sexually Abused: No  ? ?Family Status  ?Relation Name Status  ? Mother  Deceased   ? Father  Deceased  ? Sister  Alive  ?     dx at age 80  ? Neg Hx  (Not Specified)  ? ?Family History  ?Problem Relation Age of Onset  ? Heart attack Mother   ? Stroke Mother   ? Diabetes Mother   ? Aneurysm Father   ? Breast cancer Sister   ? Diabetes Sister   ? Ovarian cancer Neg Hx   ? Colon cancer Neg Hx   ? ?Allergies  ?Allergen Reactions  ? Mevacor [Lovastatin] Other (See Comments)  ?  Muscle cramps  ?  ?Patient Care Team: ?Chelsea Sprout, FNP as PCP - General (Family Medicine)  ? ?Medications: ?Outpatient Medications Prior to Visit  ?Medication Sig Note  ? aspirin 81 MG tablet Take 81 mg by mouth daily.   ? CALCIUM-VITAMIN D PO Take 800 mg by mouth daily.   ? carvedilol (COREG) 6.25 MG tablet Take 1 tablet (6.25 mg total) by mouth 2 (two) times daily.   ? empagliflozin (JARDIANCE) 25 MG TABS tablet Take 1 tablet (25 mg total) by mouth daily before breakfast.   ? glipiZIDE (GLUCOTROL) 10 MG tablet TAKE 1 TABLET BY MOUTH TWICE DAILY 30 MINUTES BEFORE A MEAL   ? glucose blood (ONETOUCH VERIO) test strip Use as instructed   ? linagliptin (TRADJENTA) 5 MG TABS tablet Take 1 tablet (5 mg total) by mouth daily.   ? lisinopril (ZESTRIL) 20 MG tablet Take 2 tablets (40 mg total) by mouth daily.   ? meloxicam (MOBIC) 7.5 MG tablet Take 1 tablet (7.5 mg total) by mouth daily.   ? metFORMIN (GLUCOPHAGE) 1000 MG tablet Take 1 tablet (1,000 mg total) by mouth 2 (two) times daily.   ? olopatadine (PATANOL) 0.1 % ophthalmic solution INSTILL 1 DROP INTO EACH EYE TWICE DAILY   ? [DISCONTINUED] atorvastatin (LIPITOR) 20 MG tablet Take 1 tablet by mouth once daily   ? [DISCONTINUED] quinapril (ACCUPRIL) 20 MG tablet Take 20 mg by mouth at bedtime. 06/29/2021: recalled  ? ?No facility-administered medications prior to visit.  ? ? ?Review of Systems  ?HENT:  Positive for congestion.   ?Musculoskeletal:  Positive for back pain.  ?Allergic/Immunologic: Positive for environmental allergies.  ? ?Last CBC ?Lab Results  ?Component Value  Date  ? WBC 7.8 08/25/2020  ? HGB 14.1 08/25/2020  ? HCT 43.8 08/25/2020  ? MCV 88 08/25/2020  ? MCH 28.3 08/25/2020  ? RDW 11.8 08/25/2020  ? PLT 316 08/25/2020  ? ?Last metabolic panel ?Lab Results  ?Component Value Date  ? GLUCOSE 194 (H) 03/30/2021  ? NA 144 03/30/2021  ? K 4.2 03/30/2021  ? CL 104 03/30/2021  ? CO2 22 03/30/2021  ? BUN 8 03/30/2021  ?  CREATININE 0.52 (L) 03/30/2021  ? EGFR 102 03/30/2021  ? CALCIUM 9.6 03/30/2021  ? PHOS 3.5 02/16/2016  ? PROT 7.2 03/30/2021  ? ALBUMIN 4.5 03/30/2021  ? LABGLOB 2.7 03/30/2021  ? AGRATIO 1.7 03/30/2021  ? BILITOT 0.2 03/30/2021  ? ALKPHOS 86 03/30/2021  ? AST 13 03/30/2021  ? ALT 11 03/30/2021  ? ANIONGAP 12 12/16/2011  ? ?Last lipids ?Lab Results  ?Component Value Date  ? CHOL 132 03/30/2021  ? HDL 40 03/30/2021  ? [place] 73 03/30/2021  ? TRIG 102 03/30/2021  ? CHOLHDL 3.3 03/30/2021  ? ?Last hemoglobin A1c ?Lab Results  ?Component Value Date  ? HGBA1C 7.9 (A) 03/30/2021  ? ?Last thyroid functions ?Lab Results  ?Component Value Date  ? TSH 2.050 08/25/2020  ? ?  ? Objective  ? ?  ?BP 116/60 (BP Location: Left Arm, Patient Position: Sitting, Cuff Size: Large)   Pulse 76   Temp 98.5 ?F (36.9 ?C) (Oral)   Resp 16   Ht _0  (1.575 m)   Wt 230 lb (104.3 kg)   SpO2 96%   BMI 42.07 kg/m?  ?BP Readings from Last 3 Encounters:  ?06/29/21 116/60  ?05/04/21 (!) 153/61  ?03/30/21 (!) 125/59  ? ?Wt Readings from Last 3 Encounters:  ?06/29/21 230 lb (104.3 kg)  ?06/06/21 231 lb (104.8 kg)  ?05/04/21 231 lb (104.8 kg)  ? ?  ? ? ?Physical Exam ?Vitals and nursing note reviewed.  ?Constitutional:   ?   General: She is awake. She is not in acute distress. ?   Appearance: Normal appearance. She is well-developed and well-groomed. She is obese. She is not ill-appearing, toxic-appearing or diaphoretic.  ?HENT:  ?   Head: Normocephalic and atraumatic.  ?   Jaw: There is normal jaw occlusion. No trismus, tenderness, swelling or pain on movement.  ?   Right Ear: Hearing,  tympanic membrane, ear canal and external ear normal. There is no impacted cerumen.  ?   Left Ear: Hearing, tympanic membrane, ear canal and external ear normal. There is no impacted cerumen.  ?   Nose: Nose normal. No

## 2021-06-29 ENCOUNTER — Ambulatory Visit (INDEPENDENT_AMBULATORY_CARE_PROVIDER_SITE_OTHER): Payer: Medicare Other | Admitting: Family Medicine

## 2021-06-29 ENCOUNTER — Encounter: Payer: Self-pay | Admitting: Family Medicine

## 2021-06-29 VITALS — BP 116/60 | HR 76 | Temp 98.5°F | Resp 16 | Ht 62.0 in | Wt 230.0 lb

## 2021-06-29 DIAGNOSIS — I152 Hypertension secondary to endocrine disorders: Secondary | ICD-10-CM

## 2021-06-29 DIAGNOSIS — E785 Hyperlipidemia, unspecified: Secondary | ICD-10-CM

## 2021-06-29 DIAGNOSIS — E1169 Type 2 diabetes mellitus with other specified complication: Secondary | ICD-10-CM

## 2021-06-29 DIAGNOSIS — E1159 Type 2 diabetes mellitus with other circulatory complications: Secondary | ICD-10-CM | POA: Diagnosis not present

## 2021-06-29 DIAGNOSIS — Z0000 Encounter for general adult medical examination without abnormal findings: Secondary | ICD-10-CM | POA: Diagnosis not present

## 2021-06-29 DIAGNOSIS — Z23 Encounter for immunization: Secondary | ICD-10-CM

## 2021-06-29 MED ORDER — ATORVASTATIN CALCIUM 20 MG PO TABS: 20.0000 mg | ORAL_TABLET | Freq: Every day | ORAL | 3 refills | Status: DC

## 2021-06-29 NOTE — Assessment & Plan Note (Signed)
Chronic, stable ?Denies CP ?Denies SOB/ DOE ?Denies low blood pressure/hypotension ?Denies vision changes ?No LE Edema noted on exam ?Continue medication, Coreg 6.25 mg BID; Lisinopril 40 mg QD ('20mg'$  tablets) ?Denies side effects ?RTC 3 months ?Seek emergent care if you develop chest pain or chest pressure ? ?

## 2021-06-29 NOTE — Assessment & Plan Note (Signed)
Consent received; provided VIS to patient ?2/2 vaccination today; series complete ?

## 2021-06-29 NOTE — Assessment & Plan Note (Signed)
Chronic, stable ?Body mass index is 42.07 kg/m?. ?Discussed importance of healthy weight management ?Discussed diet and exercise ? ?

## 2021-06-29 NOTE — Assessment & Plan Note (Signed)
UTD on vision; has planned cataract repair in May and June ?UTD on dental ?Things to do to keep yourself healthy  ?- Exercise at least 30-45 minutes a day, 3-4 days a week.  ?- Eat a low-fat diet with lots of fruits and vegetables, up to 7-9 servings per day.  ?- Seatbelts can save your life. Wear them always.  ?- Smoke detectors on every level of your home, check batteries every year.  ?- Eye Doctor - have an eye exam every 1-2 years  ?- Safe sex - if you may be exposed to STDs, use a condom.  ?- Alcohol -  If you drink, do it moderately, less than 2 drinks per day.  ?- [place]. Choose someone to speak for you if you are not able.  ?- Depression is common in our stressful world.If you're feeling down or losing interest in things you normally enjoy, please come in for a visit.  ?- Violence - If anyone is threatening or hurting you, please call immediately. ? ? ?

## 2021-06-29 NOTE — Assessment & Plan Note (Signed)
Chronic, previously stable ?We recommend diet low in saturated fat and regular exercise - 30 min at least 5 times per week ?Continue lipitor 20 mg ?ASCVD risk elevated; recommend use of 81 mg ASA ?The 10-year ASCVD risk score (Arnett DK, et al., 2019) is: 12.9% ?  Values used to calculate the score: ?    Age: 66 years ?    Sex: Female ?    Is Non-Hispanic African American: Yes ?    Diabetic: Yes ?    Tobacco smoker: No ?    Systolic Blood Pressure: 734 mmHg ?    Is BP treated: Yes ?    HDL Cholesterol: 40 mg/dL ?    Total Cholesterol: 132 mg/dL ? ?

## 2021-06-30 LAB — LIPID PANEL
Chol/HDL Ratio: 3.6 ratio (ref 0.0–4.4)
Cholesterol, Total: 142 mg/dL (ref 100–199)
HDL: 39 mg/dL — ABNORMAL LOW (ref >39)
LDL Chol Calc (NIH): 81 mg/dL (ref 0–99)
Triglycerides: 123 mg/dL (ref 0–149)
VLDL Cholesterol Cal: 22 mg/dL (ref 5–40)

## 2021-06-30 LAB — CBC WITH DIFFERENTIAL/PLATELET
Basophils Absolute: 0.1 10*3/uL (ref 0.0–0.2)
Basos: 1 %
EOS (ABSOLUTE): 0.1 10*3/uL (ref 0.0–0.4)
Eos: 2 %
Hematocrit: 41.9 % (ref 34.0–46.6)
Hemoglobin: 14.1 g/dL (ref 11.1–15.9)
Immature Grans (Abs): 0 10*3/uL (ref 0.0–0.1)
Immature Granulocytes: 0 %
Lymphocytes Absolute: 3.2 10*3/uL — ABNORMAL HIGH (ref 0.7–3.1)
Lymphs: 46 %
MCH: 29.1 pg (ref 26.6–33.0)
MCHC: 33.7 g/dL (ref 31.5–35.7)
MCV: 87 fL (ref 79–97)
Monocytes Absolute: 0.6 10*3/uL (ref 0.1–0.9)
Monocytes: 9 %
Neutrophils Absolute: 2.9 10*3/uL (ref 1.4–7.0)
Neutrophils: 42 %
Platelets: 323 10*3/uL (ref 150–450)
RBC: 4.84 x10E6/uL (ref 3.77–5.28)
RDW: 11.7 % (ref 11.7–15.4)
WBC: 6.9 10*3/uL (ref 3.4–10.8)

## 2021-06-30 LAB — COMPREHENSIVE METABOLIC PANEL
ALT: 10 IU/L (ref 0–32)
AST: 13 IU/L (ref 0–40)
Albumin/Globulin Ratio: 1.5 (ref 1.2–2.2)
Albumin: 4.4 g/dL (ref 3.8–4.8)
Alkaline Phosphatase: 91 IU/L (ref 44–121)
BUN/Creatinine Ratio: 24 (ref 12–28)
BUN: 13 mg/dL (ref 8–27)
Bilirubin Total: 0.3 mg/dL (ref 0.0–1.2)
CO2: 24 mmol/L (ref 20–29)
Calcium: 9.8 mg/dL (ref 8.7–10.3)
Chloride: 100 mmol/L (ref 96–106)
Creatinine, Ser: 0.55 mg/dL — ABNORMAL LOW (ref 0.57–1.00)
Globulin, Total: 3 g/dL (ref 1.5–4.5)
Glucose: 271 mg/dL — ABNORMAL HIGH (ref 70–99)
Potassium: 4.4 mmol/L (ref 3.5–5.2)
Sodium: 141 mmol/L (ref 134–144)
Total Protein: 7.4 g/dL (ref 6.0–8.5)
eGFR: 101 mL/min/{1.73_m2} (ref >59)

## 2021-06-30 LAB — TSH+FREE T4
Free T4: 1.18 ng/dL (ref 0.82–1.77)
TSH: 1.31 u[IU]/mL (ref 0.450–4.500)

## 2021-06-30 LAB — HEMOGLOBIN A1C
Est. average glucose Bld gHb Est-mCnc: 217 mg/dL
Hgb A1c MFr Bld: 9.2 % — ABNORMAL HIGH (ref 4.8–5.6)

## 2021-08-05 ENCOUNTER — Other Ambulatory Visit: Payer: Self-pay | Admitting: Family Medicine

## 2021-08-05 DIAGNOSIS — M25531 Pain in right wrist: Secondary | ICD-10-CM

## 2021-08-05 NOTE — Telephone Encounter (Signed)
Requested Prescriptions  Pending Prescriptions Disp Refills  . meloxicam (MOBIC) 7.5 MG tablet [Pharmacy Med Name: Meloxicam 7.5 MG Oral Tablet] 30 tablet 0    Sig: Take 1 tablet by mouth once daily     Analgesics:  COX2 Inhibitors Failed - 08/05/2021 11:58 AM      Failed - Manual Review: Labs are only required if the patient has taken medication for more than 8 weeks.      Failed - Cr in normal range and within 360 days    Creatinine  Date Value Ref Range Status  12/16/2011 0.61 0.60 - 1.30 mg/dL Final   Creatinine, Ser  Date Value Ref Range Status  06/29/2021 0.55 (L) 0.57 - 1.00 mg/dL Final         Passed - HGB in normal range and within 360 days    Hemoglobin  Date Value Ref Range Status  06/29/2021 14.1 11.1 - 15.9 g/dL Final         Passed - HCT in normal range and within 360 days    Hematocrit  Date Value Ref Range Status  06/29/2021 41.9 34.0 - 46.6 % Final         Passed - AST in normal range and within 360 days    AST  Date Value Ref Range Status  06/29/2021 13 0 - 40 IU/L Final   SGOT(AST)  Date Value Ref Range Status  12/16/2011 71 (H) 15 - 37 Unit/L Final         Passed - ALT in normal range and within 360 days    ALT  Date Value Ref Range Status  06/29/2021 10 0 - 32 IU/L Final   SGPT (ALT)  Date Value Ref Range Status  12/16/2011 47 12 - 78 U/L Final         Passed - eGFR is 30 or above and within 360 days    EGFR (African American)  Date Value Ref Range Status  12/16/2011 >60  Final   GFR calc Af Amer  Date Value Ref Range Status  04/14/2020 117 >59 mL/min/1.73 Final    Comment:    **In accordance with recommendations from the NKF-ASN Task force,**   [facility] is in the process of updating its eGFR calculation to the   2021 CKD-EPI creatinine equation that estimates kidney function   without a race variable.    EGFR (Non-African Amer.)  Date Value Ref Range Status  12/16/2011 >60  Final    Comment:    eGFR values <73mL/min/1.73 m2 may  be an indication of chronic kidney disease (CKD). Calculated eGFR is useful in patients with stable renal function. The eGFR calculation will not be reliable in acutely ill patients when serum creatinine is changing rapidly. It is not useful in  patients on dialysis. The eGFR calculation may not be applicable to patients at the low and high extremes of body sizes, pregnant women, and vegetarians. POTASSIUM,AST - Slight hemolysis, interpret results with  - caution.    GFR calc non Af Amer  Date Value Ref Range Status  04/14/2020 102 >59 mL/min/1.73 Final   eGFR  Date Value Ref Range Status  06/29/2021 101 >59 mL/min/1.73 Final         Passed - Patient is not pregnant      Passed - Valid encounter within last 12 months    Recent Outpatient Visits          1 month ago Annual physical exam   [place],  Jaci Standard, FNP   3 months ago Gross hematuria   [facility] Birdie Sons, MD   4 months ago Type 2 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, without long-term current use of insulin, unspecified laterality [facility])   [facility] Tally Joe T, FNP   7 months ago Type 2 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, without long-term current use of insulin, unspecified laterality [facility])   [facility] Tally Joe T, FNP   11 months ago Type 2 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, without long-term current use of insulin, unspecified laterality [facility])   [place], Vickki Muff, PA-C      Future Appointments            In 1 month Gwyneth Sprout, [place], [place]   In 4 months Amalia Hailey, Nyoka Lint, MD [facility]

## 2021-08-31 ENCOUNTER — Other Ambulatory Visit: Payer: Self-pay

## 2021-09-01 ENCOUNTER — Ambulatory Visit (INDEPENDENT_AMBULATORY_CARE_PROVIDER_SITE_OTHER): Payer: Medicare Other | Admitting: Gastroenterology

## 2021-09-01 ENCOUNTER — Other Ambulatory Visit: Payer: Self-pay

## 2021-09-01 ENCOUNTER — Encounter: Payer: Self-pay | Admitting: Gastroenterology

## 2021-09-01 VITALS — BP 110/72 | HR 97 | Temp 98.9°F | Ht 62.0 in | Wt 233.4 lb

## 2021-09-01 DIAGNOSIS — R140 Abdominal distension (gaseous): Secondary | ICD-10-CM

## 2021-09-01 DIAGNOSIS — R142 Eructation: Secondary | ICD-10-CM

## 2021-09-01 NOTE — Patient Instructions (Signed)
Low-FODMAP Eating Plan  FODMAP stands for fermentable oligosaccharides, disaccharides, monosaccharides, and polyols. These are sugars that are hard for some people to digest. A low-FODMAP eating plan may help some people who have irritable bowel syndrome (IBS) and certain other bowel (intestinal) diseases to manage their symptoms. This meal plan can be complicated to follow. Work with a diet and nutrition specialist (dietitian) to make a low-FODMAP eating plan that is right for you. A dietitian can help make sure that you get enough nutrition from this diet. What are tips for following this plan? Reading food labels Check labels for hidden FODMAPs such as: High-fructose syrup. Honey. Agave. Natural fruit flavors. Onion or garlic powder. Choose low-FODMAP foods that contain 3-4 grams of fiber per serving. Check food labels for serving sizes. Eat only one serving at a time to make sure FODMAP levels stay low. Shopping Shop with a list of foods that are recommended on this diet and make a meal plan. Meal planning Follow a low-FODMAP eating plan for up to 6 weeks, or as told by your health care provider or dietitian. To follow the eating plan: Eliminate high-FODMAP foods from your diet completely. Choose only low-FODMAP foods to eat. You will do this for 2-6 weeks. Gradually reintroduce high-FODMAP foods into your diet one at a time. Most people should wait a few days before introducing the next new high-FODMAP food into their meal plan. Your dietitian can recommend how quickly you may reintroduce foods. Keep a daily record of what and how much you eat and drink. Make note of any symptoms that you have after eating. Review your daily record with a dietitian regularly to identify which foods you can eat and which foods you should avoid. General tips Drink enough fluid each day to keep your urine pale yellow. Avoid processed foods. These often have added sugar and may be high in FODMAPs. Avoid  most dairy products, whole grains, and sweeteners. Work with a dietitian to make sure you get enough fiber in your diet. Avoid high FODMAP foods at meals to manage symptoms. Recommended foods Fruits Bananas, oranges, tangerines, lemons, limes, blueberries, raspberries, strawberries, grapes, cantaloupe, honeydew melon, kiwi, papaya, passion fruit, and pineapple. Limited amounts of dried cranberries, banana chips, and shredded coconut. Vegetables Eggplant, zucchini, cucumber, peppers, green beans, bean sprouts, lettuce, arugula, kale, Swiss chard, spinach, collard greens, bok choy, summer squash, potato, and tomato. Limited amounts of corn, carrot, and sweet potato. Green parts of scallions. Grains Gluten-free grains, such as rice, oats, buckwheat, quinoa, corn, polenta, and millet. Gluten-free pasta, bread, or cereal. Rice noodles. Corn tortillas. Meats and other proteins Unseasoned beef, pork, poultry, or fish. Eggs. Bacon. Tofu (firm) and tempeh. Limited amounts of nuts and seeds, such as almonds, walnuts, brazil nuts, pecans, peanuts, nut butters, pumpkin seeds, chia seeds, and sunflower seeds. Dairy Lactose-free milk, yogurt, and kefir. Lactose-free cottage cheese and ice cream. Non-dairy milks, such as almond, coconut, hemp, and rice milk. Non-dairy yogurt. Limited amounts of goat cheese, brie, mozzarella, parmesan, swiss, and other hard cheeses. Fats and oils Butter-free spreads. Vegetable oils, such as olive, canola, and sunflower oil. Seasoning and other foods Artificial sweeteners with names that do not end in "ol," such as aspartame, saccharine, and stevia. Maple syrup, white table sugar, raw sugar, brown sugar, and molasses. Mayonnaise, soy sauce, and tamari. Fresh basil, coriander, parsley, rosemary, and thyme. Beverages Water and mineral water. Sugar-sweetened soft drinks. Small amounts of orange juice or cranberry juice. Black and green tea. Most dry wines.   Coffee. The items listed  above may not be a complete list of foods and beverages you can eat. Contact a dietitian for more information. Foods to avoid Fruits Fresh, dried, and juiced forms of apple, pear, watermelon, peach, plum, cherries, apricots, blackberries, boysenberries, figs, nectarines, and mango. Avocado. Vegetables Chicory root, artichoke, asparagus, cabbage, snow peas, Brussels sprouts, broccoli, sugar snap peas, mushrooms, celery, and cauliflower. Onions, garlic, leeks, and the white part of scallions. Grains Wheat, including kamut, durum, and semolina. Barley and bulgur. Couscous. Wheat-based cereals. Wheat noodles, bread, crackers, and pastries. Meats and other proteins Fried or fatty meat. Sausage. Cashews and pistachios. Soybeans, baked beans, black beans, chickpeas, kidney beans, fava beans, navy beans, lentils, black-eyed peas, and split peas. Dairy Milk, yogurt, ice cream, and soft cheese. Cream and sour cream. Milk-based sauces. Custard. Buttermilk. Soy milk. Seasoning and other foods Any sugar-free gum or candy. Foods that contain artificial sweeteners such as sorbitol, mannitol, isomalt, or xylitol. Foods that contain honey, high-fructose corn syrup, or agave. Bouillon, vegetable stock, beef stock, and chicken stock. Garlic and onion powder. Condiments made with onion, such as hummus, chutney, pickles, relish, salad dressing, and salsa. Tomato paste. Beverages Chicory-based drinks. Coffee substitutes. Chamomile tea. Fennel tea. Sweet or fortified wines such as port or sherry. Diet soft drinks made with isomalt, mannitol, maltitol, sorbitol, or xylitol. Apple, pear, and mango juice. Juices with high-fructose corn syrup. The items listed above may not be a complete list of foods and beverages you should avoid. Contact a dietitian for more information. Summary FODMAP stands for fermentable oligosaccharides, disaccharides, monosaccharides, and polyols. These are sugars that are hard for some people to  digest. A low-FODMAP eating plan is a short-term diet that helps to ease symptoms of certain bowel diseases. The eating plan usually lasts up to 6 weeks. After that, high-FODMAP foods are reintroduced gradually and one at a time. This can help you find out which foods may be causing symptoms. A low-FODMAP eating plan can be complicated. It is best to work with a dietitian who has experience with this type of plan. This information is not intended to replace advice given to you by your health care provider. Make sure you discuss any questions you have with your health care provider. Document Revised: 07/10/2019 Document Reviewed: 07/10/2019 Elsevier Patient Education  [employer]. Charcoal tablets or capsules What is this medication? CHARCOAL (CHAR kole) is a dietary supplement. It is used to absorb gases in the stomach that cause stomach gas. Do not use this supplement to treat poisonings or overdose. The FDA has not approved this supplement for any medical use. This medicine may be used for other purposes; ask your health care provider or pharmacist if you have questions. COMMON BRAND NAME(S): Charcoal Plus DS, CharcoCap, CharcoCaps Anti-Gas What should I tell my care team before I take this medication? They need to know if you have any of these conditions: food or medicine poisoning have frequent heartburn or gas have recently traveled to another country stomach or intestinal disease an unusual or allergic reaction to charcoal, other medicines, foods, dyes, or preservatives pregnant or trying to get pregnant breast-feeding How should I use this medication? Take by mouth with a glass of water. Follow the directions on the package label or use as directed by a health care provider. Do not take this medicine more often than directed. Talk to your pediatrician regarding the use of this medicine in children. Special care may be needed. Overdosage: If you think you  have taken too much of  this medicine contact a poison control center or emergency room at once. NOTE: This medicine is only for you. Do not share this medicine with others. What if I miss a dose? If you miss a dose, take it as soon as you can. If it is almost time for your next dose, take only that dose. Do not take double or extra doses. What may interact with this medication? Do not take this medicine with any of the following medications: ipecac This medicine may also interact with the following medications: acarbose aripiprazole birth control pills carbamazepine dapsone digoxin olanzapine phenothiazines like chlorpromazine, mesoridazine, prochlorperazine, thioridazine phenytoin pindolol some herbal medicines or dietary supplements theophylline ursodeoxycholic acid This list may not describe all possible interactions. Give your health care provider a list of all the medicines, herbs, non-prescription drugs, or dietary supplements you use. Also tell them if you smoke, drink alcohol, or use illegal drugs. Some items may interact with your medicine. What should I watch for while using this medication? Tell your doctor or healthcare professional if your symptoms do not start to get better or if they get worse. See your doctor if your symptoms last for 3 days. Do not use this medicine to treat a poisoning or overdose. Get emergency help. This medicine may bind to other medicines or dairy products in the stomach. Do not take any other medicines for at least 2 hours before or after taking this medicine. Do not eat or drink milk, cheese, or other diary for at least 2 hours before or after taking this medicine. Herbal or dietary supplements are not regulated like medicines. Rigid quality control standards are not required for dietary supplements. The purity and strength of these products can vary. The safety and effect of this dietary supplement for a certain disease or illness is not well known. This product is not  intended to diagnose, treat, cure or prevent any disease. The Food and Drug Administration suggests the following to help consumers protect themselves: Always read product labels and follow directions. Natural does not mean a product is safe for humans to take. Look for products that include USP after the ingredient name. This means that the manufacturer followed the standards of the [place] Pharmacopoeia. Supplements made or sold by a nationally known food or drug company are more likely to be made under tight controls. You can write to the company for more information about how the product was made. What side effects may I notice from receiving this medication? Side effects that you should report to your doctor or health care professional as soon as possible: allergic reactions like skin rash, itching or hives, swelling of the face, lips, or tongue Side effects that usually do not require medical attention (report to your doctor or health care professional if they continue or are bothersome): constipation dark stools dark tongue diarrhea or vomiting This list may not describe all possible side effects. Call your doctor for medical advice about side effects. You may report side effects to FDA at 1-800-FDA-1088. Where should I keep my medication? Keep out of the reach of children. Store at room temperature between 15 and 30 degrees C (59 and 86 degrees F). Protect from heat and moisture. Keep tightly closed. Throw away any unused medicine after the expiration date. NOTE: This sheet is a summary. It may not cover all possible information. If you have questions about this medicine, talk to your doctor, pharmacist, or health care provider.  2023 Elsevier/Gold  Standard (2019-07-03 00:00:00)

## 2021-09-01 NOTE — Progress Notes (Signed)
Chelsea Bellows MD, MRCP(U.K) [address]  [place]  [place], [place] [address]  Main: [phone]  Fax: [phone]   Gastroenterology Consultation  Referring Provider:     Birdie Sons, MD Primary Care Physician:  Chelsea Sprout, FNP Primary Gastroenterologist:  Dr. Jonathon Gomez  Reason for Consultation:    Excessive gas        HPI:   Chelsea Gomez is a 66 y.o. y/o female referred for consultation & management  by Chelsea Sprout, FNP.    She states that she has had issues with bloating and belching for many months if not years.  The gas is not foul-smelling.  No complaints of real early satiety.  She is not on any injectable GLP-1 analogs.  No nausea no vomiting no heartburn She was referred in March 2023 for belching and excessive gas. Last colonoscopy in June 2022 showed 2 sessile polyps in the cecum 2 to 3 mm in size resected nonbleeding internal hemorrhoids.  One of the polyps was a tubular adenoma.  06/23/2021 HbA1c 9.2, hemoglobin 14.1, CMP was normal except elevated glucose.  She is on Jardiance glipizide Tradjenta meloxicam metformin.  Consumes sweeteners in her coffee which contain artificial sweeteners, sweet tea and soda on a daily basis  Past Medical History:  Diagnosis Date   Arthritis    Left knee   Diabetes mellitus without complication (HCC)    GERD (gastroesophageal reflux disease)    H/O multiple trauma    Hyperlipidemia    Hypertension    Low back pain    Lung collapse    Shoulder pain    left   Thyroid disease    Wears partial dentures     Past Surgical History:  Procedure Laterality Date   BLADDER SURGERY     CESAREAN SECTION     COLONOSCOPY WITH PROPOFOL N/A 04/20/2017   Procedure: COLONOSCOPY WITH PROPOFOL;  Surgeon: Chelsea Bellows, MD;  Location: [facility] ENDOSCOPY;  Service: Gastroenterology;  Laterality: N/A;   COLONOSCOPY WITH PROPOFOL N/A 08/30/2020   Procedure: COLONOSCOPY WITH PROPOFOL;  Surgeon: Lucilla Lame, MD;  Location: [place];  Service: Endoscopy;  Laterality: N/A;   POLYPECTOMY N/A 08/30/2020   Procedure: POLYPECTOMY;  Surgeon: Lucilla Lame, MD;  Location: [place];  Service: Endoscopy;  Laterality: N/A;   THYROID SURGERY     TOOTH EXTRACTION  2016    Prior to Admission medications   Medication Sig Start Date End Date Taking? Authorizing Provider  aspirin 81 MG tablet Take 81 mg by mouth daily.    [provider]  atorvastatin (LIPITOR) 20 MG tablet Take 1 tablet (20 mg total) by mouth daily. 06/29/21   Chelsea Sprout, FNP  CALCIUM-VITAMIN D PO Take 800 mg by mouth daily.    [provider]  carvedilol (COREG) 6.25 MG tablet Take 1 tablet (6.25 mg total) by mouth 2 (two) times daily. 03/04/21   Chelsea Sprout, FNP  Difluprednate 0.05 % EMUL Place 1 drop into the left eye 4 (four) times daily. 07/29/21   [provider]  empagliflozin (JARDIANCE) 25 MG TABS tablet Take 1 tablet (25 mg total) by mouth daily before breakfast. 03/30/21   Chelsea Sprout, FNP  gatifloxacin (ZYMAXID) 0.5 % SOLN Place 1 drop into the left eye 4 (four) times daily. 07/29/21   [provider]  glipiZIDE (GLUCOTROL) 10 MG tablet TAKE 1 TABLET BY MOUTH TWICE DAILY 30 MINUTES BEFORE A MEAL 03/30/21  Tally Joe T, FNP  glucose blood [facility] VERIO) test strip Use as instructed 12/17/19   Trinna Post, PA-C  ketorolac (ACULAR) 0.5 % ophthalmic solution SMARTSIG:In Eye(s) 07/29/21   [provider]  linagliptin (TRADJENTA) 5 MG TABS tablet Take 1 tablet (5 mg total) by mouth daily. 06/22/21   Chelsea Sprout, FNP  lisinopril (ZESTRIL) 20 MG tablet Take 2 tablets (40 mg total) by mouth daily. 03/30/21   Chelsea Sprout, FNP  meloxicam (MOBIC) 7.5 MG tablet Take 1 tablet by mouth once daily 08/05/21   Chelsea Sprout, FNP  metFORMIN (GLUCOPHAGE) 1000 MG tablet Take 1 tablet (1,000 mg total) by mouth 2 (two) times daily. 03/30/21   Chelsea Sprout, FNP  olopatadine (PATANOL) 0.1 %  ophthalmic solution INSTILL 1 DROP INTO EACH EYE TWICE DAILY 08/02/17   [provider]    Family History  Problem Relation Age of Onset   Heart attack Mother    Stroke Mother    Diabetes Mother    Aneurysm Father    Breast cancer Sister    Diabetes Sister    Ovarian cancer Neg Hx    Colon cancer Neg Hx      Social History   Tobacco Use   Smoking status: Former    Types: Cigarettes   Smokeless tobacco: Never  Vaping Use   Vaping Use: Never used  Substance Use Topics   Alcohol use: No   Drug use: No    Allergies as of 09/01/2021 - Review Complete 06/29/2021  Allergen Reaction Noted   Mevacor [lovastatin] Other (See Comments) 09/03/2013    Review of Systems:    All systems reviewed and negative except where noted in HPI.   Physical Exam:  There were no vitals taken for this visit. No LMP recorded. Patient is postmenopausal. Psych:  Alert and cooperative. Normal mood and affect. General:   Alert,  Well-developed, well-nourished, pleasant and cooperative in NAD Head:  Normocephalic and atraumatic. Eyes:  Sclera clear, no icterus.   Conjunctiva pink. Abdomen:  Normal bowel sounds.  No bruits.  Soft, non-tender and non-distended without masses, hepatosplenomegaly or hernias noted.  No guarding or rebound tenderness.    Neurologic:  Alert and oriented x3;  grossly normal neurologically. Psych:  Alert and cooperative. Normal mood and affect.  Imaging Studies: No results found.  Assessment and Plan:   Chelsea Gomez is a 67 y.o. y/o female has been referred for belching excessive gas.  Differentials are wide ranging from gastroparesis related to uncontrolled diabetes versus gastroparesis/bloating related to medications for her diabetes, artificial sweeteners and sugars   Plan 1.  Avoid all artificial sugars and sweeteners trial of low FODMAP diet trial o 2.  EGD to rule out gastric outlet obstruction 3.  Charcoal tablets as needed   I have discussed  alternative options, risks & benefits,  which include, but are not limited to, bleeding, infection, perforation,respiratory complication & drug reaction.  The patient agrees with this plan & written consent will be obtained.     Follo/w up in  as needed  Dr Chelsea Bellows MD,MRCP(U.K)

## 2021-09-22 ENCOUNTER — Other Ambulatory Visit: Payer: Self-pay | Admitting: Family Medicine

## 2021-09-22 DIAGNOSIS — M25531 Pain in right wrist: Secondary | ICD-10-CM

## 2021-09-26 NOTE — Progress Notes (Unsigned)
I,Roshena L Chambers,acting as a scribe for Gwyneth Sprout, FNP.,have documented all relevant documentation on the behalf of Gwyneth Sprout, FNP,as directed by  Gwyneth Sprout, FNP while in the presence of Gwyneth Sprout, FNP.   Established patient visit   Patient: Chelsea Gomez   DOB: 06-07-1955   66 y.o. Female  MRN: [id number] Visit Date: 09/27/2021  Today's healthcare provider: Gwyneth Sprout, FNP  Re Introduced to nurse practitioner role and practice setting.  All questions answered.  Discussed provider/patient relationship and expectations.   Chief Complaint  Patient presents with   Diabetes   Hypertension   Subjective    HPI  Diabetes Mellitus Type II, Follow-up  Lab Results  Component Value Date   HGBA1C 8.1 (A) 09/27/2021   HGBA1C 9.2 (H) 06/29/2021   HGBA1C 7.9 (A) 03/30/2021   Wt Readings from Last 3 Encounters:  09/27/21 230 lb (104.3 kg)  09/01/21 233 lb 6.4 oz (105.9 kg)  06/29/21 230 lb (104.3 kg)   Last seen for diabetes 3 months ago.  Management since then includes recommend balanced, lower carb meals. Smaller meal size, adding snacks. Choosing water as drink of choice and increasing purposeful exercise. She reports fair compliance with treatment. She is not having side effects.  Symptoms: No fatigue No foot ulcerations  No appetite changes No nausea  No paresthesia of the feet  No polydipsia  No polyuria Yes visual disturbances   No vomiting     Home blood sugar records:  blood sugars are not checked  Episodes of hypoglycemia? No    Current insulin regiment: none Most Recent Eye Exam: <1 year ago Current exercise: none Current diet habits: in general, an "unhealthy" diet  Pertinent Labs: Lab Results  Component Value Date   CHOL 142 06/29/2021   HDL 39 (L) 06/29/2021   LDLCALC 81 06/29/2021   TRIG 123 06/29/2021   CHOLHDL 3.6 06/29/2021   Lab Results  Component Value Date   NA 141 06/29/2021   K 4.4 06/29/2021   CREATININE 0.55 (L)  06/29/2021   EGFR 101 06/29/2021   LABMICR 7.2 03/30/2021     ---------------------------------------------------------------------------------------------------   Hypertension, follow-up  BP Readings from Last 3 Encounters:  09/27/21 (!) 97/58  09/01/21 110/72  06/29/21 116/60   Wt Readings from Last 3 Encounters:  09/27/21 230 lb (104.3 kg)  09/01/21 233 lb 6.4 oz (105.9 kg)  06/29/21 230 lb (104.3 kg)     She was last seen for hypertension 3  months  ago.  BP at that visit was 116/60. Management since that visit includes continue same medication.  She reports good compliance with treatment. She is not having side effects.  She is following a Regular diet. She is not exercising. She does not smoke.  Use of agents associated with hypertension: NSAIDS.   Outside blood pressures are none. Symptoms: No chest pain No chest pressure  No palpitations No syncope  No dyspnea No orthopnea  No paroxysmal nocturnal dyspnea No lower extremity edema   Pertinent labs Lab Results  Component Value Date   CHOL 142 06/29/2021   HDL 39 (L) 06/29/2021   LDLCALC 81 06/29/2021   TRIG 123 06/29/2021   CHOLHDL 3.6 06/29/2021   Lab Results  Component Value Date   NA 141 06/29/2021   K 4.4 06/29/2021   CREATININE 0.55 (L) 06/29/2021   EGFR 101 06/29/2021   GLUCOSE 271 (H) 06/29/2021   TSH 1.310 06/29/2021  The 10-year ASCVD risk score (Arnett DK, et al., 2019) is: 9.9%  ---------------------------------------------------------------------------------------------------   Medications: Outpatient Medications Prior to Visit  Medication Sig   acetaminophen (TYLENOL) 500 MG tablet Take 500 mg by mouth every 6 (six) hours as needed.   aspirin 81 MG tablet Take 81 mg by mouth daily.   atorvastatin (LIPITOR) 20 MG tablet Take 1 tablet (20 mg total) by mouth daily.   CALCIUM-VITAMIN D PO Take 800 mg by mouth daily.   carboxymethylcellulose (REFRESH PLUS) 0.5 % SOLN 1 drop 3 (three)  times daily as needed.   carvedilol (COREG) 6.25 MG tablet Take 1 tablet (6.25 mg total) by mouth 2 (two) times daily.   Charcoal Activated (ACTIVATED CHARCOAL PO) Take 1,040 mg by mouth daily.   empagliflozin (JARDIANCE) 25 MG TABS tablet Take 1 tablet (25 mg total) by mouth daily before breakfast.   fexofenadine (ALLEGRA) 180 MG tablet Take 180 mg by mouth daily.   glipiZIDE (GLUCOTROL) 10 MG tablet TAKE 1 TABLET BY MOUTH TWICE DAILY 30 MINUTES BEFORE A MEAL   glucose blood (ONETOUCH VERIO) test strip Use as instructed   linagliptin (TRADJENTA) 5 MG TABS tablet Take 1 tablet (5 mg total) by mouth daily.   lisinopril (ZESTRIL) 20 MG tablet Take 2 tablets (40 mg total) by mouth daily.   meloxicam (MOBIC) 7.5 MG tablet Take 1 tablet by mouth once daily   metFORMIN (GLUCOPHAGE) 1000 MG tablet Take 1 tablet (1,000 mg total) by mouth 2 (two) times daily.   [DISCONTINUED] quinapril (ACCUPRIL) 20 MG tablet Take 20 mg by mouth at bedtime.   Difluprednate 0.05 % EMUL Place 1 drop into the left eye 4 (four) times daily. (Patient not taking: Reported on 09/27/2021)   gatifloxacin (ZYMAXID) 0.5 % SOLN Place 1 drop into the left eye 4 (four) times daily. (Patient not taking: Reported on 09/27/2021)   ketorolac (ACULAR) 0.5 % ophthalmic solution SMARTSIG:In Eye(s) (Patient not taking: Reported on 09/27/2021)   olopatadine (PATANOL) 0.1 % ophthalmic solution INSTILL 1 DROP INTO EACH EYE TWICE DAILY (Patient not taking: Reported on 09/27/2021)   No facility-administered medications prior to visit.    Review of Systems  Constitutional:  Negative for appetite change, chills, fatigue and fever.  Respiratory:  Negative for chest tightness and shortness of breath.   Cardiovascular:  Negative for chest pain and palpitations.  Gastrointestinal:  Negative for abdominal pain, nausea and vomiting.  Neurological:  Negative for dizziness and weakness.    Last CBC Lab Results  Component Value Date   WBC 6.9  06/29/2021   HGB 14.1 06/29/2021   HCT 41.9 06/29/2021   MCV 87 06/29/2021   MCH 29.1 06/29/2021   RDW 11.7 06/29/2021   PLT 323 06/29/2021   Last metabolic panel Lab Results  Component Value Date   GLUCOSE 271 (H) 06/29/2021   NA 141 06/29/2021   K 4.4 06/29/2021   CL 100 06/29/2021   CO2 24 06/29/2021   BUN 13 06/29/2021   CREATININE 0.55 (L) 06/29/2021   EGFR 101 06/29/2021   CALCIUM 9.8 06/29/2021   PHOS 3.5 02/16/2016   PROT 7.4 06/29/2021   ALBUMIN 4.4 06/29/2021   LABGLOB 3.0 06/29/2021   AGRATIO 1.5 06/29/2021   BILITOT 0.3 06/29/2021   ALKPHOS 91 06/29/2021   AST 13 06/29/2021   ALT 10 06/29/2021   ANIONGAP 12 12/16/2011   Last lipids Lab Results  Component Value Date   CHOL 142 06/29/2021   HDL 39 (L) 06/29/2021  LDLCALC 81 06/29/2021   TRIG 123 06/29/2021   CHOLHDL 3.6 06/29/2021   Last hemoglobin A1c Lab Results  Component Value Date   HGBA1C 8.1 (A) 09/27/2021   Last thyroid functions Lab Results  Component Value Date   TSH 1.310 06/29/2021   Last vitamin D No results found for: "25OHVITD2", "25OHVITD3", "VD25OH" Last vitamin B12 and Folate No results found for: "VITAMINB12", "FOLATE"     Objective    BP (!) 97/58 (BP Location: Left Arm, Patient Position: Sitting, Cuff Size: Large)   Pulse (!) 103   Temp 98.6 F (37 C) (Oral)   Resp 16   Wt 230 lb (104.3 kg)   SpO2 95% Comment: room air  BMI 42.07 kg/m  BP Readings from Last 3 Encounters:  09/27/21 (!) 97/58  09/01/21 110/72  06/29/21 116/60   Wt Readings from Last 3 Encounters:  09/27/21 230 lb (104.3 kg)  09/01/21 233 lb 6.4 oz (105.9 kg)  06/29/21 230 lb (104.3 kg)   SpO2 Readings from Last 3 Encounters:  09/27/21 95%  06/29/21 96%  05/04/21 99%      Physical Exam Vitals and nursing note reviewed.  Constitutional:      General: She is not in acute distress.    Appearance: Normal appearance. She is obese. She is not ill-appearing, toxic-appearing or diaphoretic.   HENT:     Head: Normocephalic and atraumatic.  Cardiovascular:     Rate and Rhythm: Normal rate and regular rhythm.     Pulses: Normal pulses.     Heart sounds: Normal heart sounds. No murmur heard.    No friction rub. No gallop.  Pulmonary:     Effort: Pulmonary effort is normal. No respiratory distress.     Breath sounds: Normal breath sounds. No stridor. No wheezing, rhonchi or rales.  Chest:     Chest wall: No tenderness.  Abdominal:     General: Bowel sounds are normal.     Palpations: Abdomen is soft.  Musculoskeletal:        General: No swelling, tenderness, deformity or signs of injury. Normal range of motion.     Right lower leg: No edema.     Left lower leg: No edema.  Skin:    General: Skin is warm and dry.     Capillary Refill: Capillary refill takes less than 2 seconds.     Coloration: Skin is not jaundiced or pale.     Findings: No bruising, erythema, lesion or rash.  Neurological:     General: No focal deficit present.     Mental Status: She is alert and oriented to person, place, and time. Mental status is at baseline.     Cranial Nerves: No cranial nerve deficit.     Sensory: No sensory deficit.     Motor: No weakness.     Coordination: Coordination normal.  Psychiatric:        Mood and Affect: Mood normal.        Behavior: Behavior normal.        Thought Content: Thought content normal.        Judgment: Judgment normal.     Results for orders placed or performed in visit on 09/27/21  POCT HgB A1C  Result Value Ref Range   Hemoglobin A1C 8.1 (A) 4.0 - 5.6 %   Est. 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 Smaller meal size,  adding snacks. Choosing water as drink of choice and increasing purposeful exercise. A1c today 8.1% On statin- lipitor 20 mg DM meds- jardiance 25 mg, glipizide 10 mg BID, tradjenta 5 mg, metformin 1000 mg BID Was taking both accupril 20 mg, expired >1 year ago; and lisinopril 40 mg (2-20s); likely cause of hypotension; clarification provided, stop accupril- continue lisinopril, RTC if BP does not improve >100/>60       Relevant Orders   POCT HgB A1C (Completed)     Other   Screening for osteoporosis    Due for screening for OP; card provided with information for scheduling Please call and schedule your mammogram:  [facility]  [address], [place],  [place]  [address] Get Driving Directions Main: [phone]  Sunday:Closed Monday:7:20 AM - 5:00 PM Tuesday:7:20 AM - 5:00 PM Wednesday:7:20 AM - 5:00 PM Thursday:7:20 AM - 5:00 PM Friday:7:20 AM - 4:30 PM Saturday:Closed       Relevant Orders   DG Bone Density     Return in about 3 months (around 12/28/2021) for chonic disease management.      Vonna Kotyk, FNP, have reviewed all documentation for this visit. The documentation on 09/27/21 for the exam, diagnosis, procedures, and orders are all accurate and complete.    Gwyneth Sprout, [place] [phone] (phone) [phone] (fax)  [place]

## 2021-09-27 ENCOUNTER — Encounter: Payer: Self-pay | Admitting: Family Medicine

## 2021-09-27 ENCOUNTER — Ambulatory Visit (INDEPENDENT_AMBULATORY_CARE_PROVIDER_SITE_OTHER): Payer: Medicare Other | Admitting: Family Medicine

## 2021-09-27 VITALS — BP 97/58 | HR 103 | Temp 98.6°F | Resp 16 | Wt 230.0 lb

## 2021-09-27 DIAGNOSIS — E113299 Type 2 diabetes mellitus with mild nonproliferative diabetic retinopathy without macular edema, unspecified eye: Secondary | ICD-10-CM

## 2021-09-27 DIAGNOSIS — Z13820 Encounter for screening for osteoporosis: Secondary | ICD-10-CM | POA: Diagnosis not present

## 2021-09-27 LAB — POCT GLYCOSYLATED HEMOGLOBIN (HGB A1C)
Est. average glucose Bld gHb Est-mCnc: 186
Hemoglobin A1C: 8.1 % — AB (ref 4.0–5.6)

## 2021-09-27 NOTE — Assessment & Plan Note (Signed)
Due for screening for OP; card provided with information for scheduling Please call and schedule your mammogram:  [facility]  [address], [place],  [place]  [address] Get Driving Directions Main: [phone]  Sunday:Closed Monday:7:20 AM - 5:00 PM Tuesday:7:20 AM - 5:00 PM Wednesday:7:20 AM - 5:00 PM Thursday:7:20 AM - 5:00 PM Friday:7:20 AM - 4:30 PM Saturday:Closed

## 2021-09-27 NOTE — Assessment & Plan Note (Signed)
Chronic, improved, however, not at goal of A1c <8% Continue to recommend balanced, lower carb meals. Smaller meal size, adding snacks. Choosing water as drink of choice and increasing purposeful exercise. A1c today 8.1% On statin- lipitor 20 mg DM meds- jardiance 25 mg, glipizide 10 mg BID, tradjenta 5 mg, metformin 1000 mg BID Was taking both accupril 20 mg, expired >1 year ago; and lisinopril 40 mg (2-20s); likely cause of hypotension; clarification provided, stop accupril- continue lisinopril, RTC if BP does not improve >100/>60

## 2021-10-03 ENCOUNTER — Ambulatory Visit: Payer: Medicare Other | Admitting: Anesthesiology

## 2021-10-03 ENCOUNTER — Encounter: Payer: Self-pay | Admitting: Gastroenterology

## 2021-10-03 ENCOUNTER — Ambulatory Visit
Admission: RE | Admit: 2021-10-03 | Discharge: 2021-10-03 | Disposition: A | Discharge status: Home / Self Care | Payer: Medicare Other | Source: Ambulatory Visit | Attending: Gastroenterology | Admitting: Gastroenterology

## 2021-10-03 ENCOUNTER — Encounter
Admission: RE | Disposition: A | Discharge status: Home / Self Care | Payer: Self-pay | Source: Ambulatory Visit | Attending: Gastroenterology

## 2021-10-03 DIAGNOSIS — R142 Eructation: Secondary | ICD-10-CM

## 2021-10-03 DIAGNOSIS — G709 Myoneural disorder, unspecified: Secondary | ICD-10-CM | POA: Insufficient documentation

## 2021-10-03 DIAGNOSIS — E119 Type 2 diabetes mellitus without complications: Secondary | ICD-10-CM | POA: Diagnosis not present

## 2021-10-03 DIAGNOSIS — I10 Essential (primary) hypertension: Secondary | ICD-10-CM | POA: Diagnosis not present

## 2021-10-03 DIAGNOSIS — K3184 Gastroparesis: Secondary | ICD-10-CM

## 2021-10-03 DIAGNOSIS — Z87891 Personal history of nicotine dependence: Secondary | ICD-10-CM | POA: Insufficient documentation

## 2021-10-03 DIAGNOSIS — G4730 Sleep apnea, unspecified: Secondary | ICD-10-CM | POA: Insufficient documentation

## 2021-10-03 DIAGNOSIS — R110 Nausea: Secondary | ICD-10-CM | POA: Diagnosis present

## 2021-10-03 DIAGNOSIS — R140 Abdominal distension (gaseous): Secondary | ICD-10-CM

## 2021-10-03 HISTORY — PX: ESOPHAGOGASTRODUODENOSCOPY (EGD) WITH PROPOFOL: SHX5813

## 2021-10-03 LAB — GLUCOSE, CAPILLARY: Glucose-Capillary: 182 mg/dL — ABNORMAL HIGH (ref 70–99)

## 2021-10-03 SURGERY — ESOPHAGOGASTRODUODENOSCOPY (EGD) WITH PROPOFOL
Anesthesia: General

## 2021-10-03 MED ORDER — LIDOCAINE HCL (CARDIAC) PF 100 MG/5ML IV SOSY
PREFILLED_SYRINGE | INTRAVENOUS | Status: DC | PRN
  Administered 2021-10-03: 100 mg via INTRAVENOUS

## 2021-10-03 MED ORDER — SODIUM CHLORIDE 0.9 % IV SOLN: INTRAVENOUS | Status: DC

## 2021-10-03 MED ORDER — GLYCOPYRROLATE 0.2 MG/ML IJ SOLN
INTRAMUSCULAR | Status: DC | PRN
  Administered 2021-10-03: .2 mg via INTRAVENOUS

## 2021-10-03 MED ORDER — PROPOFOL 10 MG/ML IV BOLUS
INTRAVENOUS | Status: DC | PRN
  Administered 2021-10-03: 50 mg via INTRAVENOUS

## 2021-10-03 MED ORDER — PROPOFOL 500 MG/50ML IV EMUL
INTRAVENOUS | Status: DC | PRN
  Administered 2021-10-03: 145 ug/kg/min via INTRAVENOUS

## 2021-10-03 NOTE — Transfer of Care (Signed)
Immediate Anesthesia Transfer of Care Note  Patient: Chelsea Gomez  Procedure(s) Performed: ESOPHAGOGASTRODUODENOSCOPY (EGD) WITH PROPOFOL  Patient Location: Endoscopy Unit  Anesthesia Type:General  Level of Consciousness: awake, drowsy and patient cooperative  Airway & Oxygen Therapy: Patient Spontanous Breathing and Patient connected to face mask oxygen  Post-op Assessment: Report given to RN and Post -op Vital signs reviewed and stable  Post vital signs: Reviewed and stable  Last Vitals:  Vitals Value Taken Time  BP    Temp    Pulse 93 10/03/21 0841  Resp 24 10/03/21 0841  SpO2 100 % 10/03/21 0841  Vitals shown include unvalidated device data.  Last Pain:  Vitals:   10/03/21 0737  TempSrc: Temporal         Complications: No notable events documented.

## 2021-10-03 NOTE — Op Note (Signed)
[facility] Gastroenterology Patient Name: Chelsea Gomez Procedure Date: 10/03/2021 8:19 AM MRN: [id number] Account #: [id number] Date of Birth: [birth year] Admit Type: Outpatient Age: 66 Room: [facility] ENDO ROOM 1 Gender: Female Note Status: Finalized Instrument Name: Upper Endoscope [id number] Procedure:             Upper GI endoscopy Indications:           Exclusion of gastroparesis, Nausea Providers:             Jonathon Bellows MD, MD Referring MD:          Jaci Standard. Rollene Rotunda (Referring MD) Medicines:             Monitored Anesthesia Care Complications:         No immediate complications. Procedure:             Pre-Anesthesia Assessment:                        - Prior to the procedure, a History and Physical was                         performed, and patient medications, allergies and                         sensitivities were reviewed. The patient's tolerance                         of previous anesthesia was reviewed.                        - The risks and benefits of the procedure and the                         sedation options and risks were discussed with the                         patient. All questions were answered and informed                         consent was obtained.                        - ASA Grade Assessment: II - A patient with mild                         systemic disease.                        After obtaining informed consent, the endoscope was                         passed under direct vision. Throughout the procedure,                         the patient's blood pressure, pulse, and oxygen                         saturations were monitored continuously. The Endoscope  was introduced through the mouth, and advanced to the                         third part of duodenum. The upper GI endoscopy was                         accomplished with ease. The patient tolerated the                         procedure well. Findings:      The  esophagus was normal.      The stomach was normal.      The examined duodenum was normal. Impression:            - Normal esophagus.                        - Normal stomach.                        - Normal examined duodenum.                        - No specimens collected. Recommendation:        - Discharge patient to home (with escort).                        - Gastroparesis diet.                        - Continue present medications.                        - Return to my office as previously scheduled. Procedure Code(s):     --- Professional ---                        [phone], Esophagogastroduodenoscopy, flexible,                         transoral; diagnostic, including collection of                         specimen(s) by brushing or washing, when performed                         (separate procedure) CPT copyright 2019 American Medical Association. All rights reserved. The codes documented in this report are preliminary and upon coder review may  be revised to meet current compliance requirements. Jonathon Bellows, MD Jonathon Bellows MD, MD 10/03/2021 8:36:44 AM This report has been signed electronically. Number of Addenda: 0 Note Initiated On: 10/03/2021 8:19 AM Estimated Blood Loss:  Estimated blood loss: none.      [facility]

## 2021-10-03 NOTE — Anesthesia Postprocedure Evaluation (Signed)
Anesthesia Post Note  Patient: Chelsea Gomez  Procedure(s) Performed: ESOPHAGOGASTRODUODENOSCOPY (EGD) WITH PROPOFOL  Patient location during evaluation: Endoscopy Anesthesia Type: General Level of consciousness: awake and alert Pain management: pain level controlled Vital Signs Assessment: post-procedure vital signs reviewed and stable Respiratory status: spontaneous breathing, nonlabored ventilation, respiratory function stable and patient connected to nasal cannula oxygen Cardiovascular status: blood pressure returned to baseline and stable Postop Assessment: no apparent nausea or vomiting Anesthetic complications: no   No notable events documented.   Last Vitals:  Vitals:   10/03/21 0849 10/03/21 0857  BP: 116/81 121/80  Pulse: 93 91  Resp: (!) 24 18  Temp:    SpO2: 100% 99%    Last Pain:  Vitals:   10/03/21 0857  TempSrc:   PainSc: 0-No pain                 Precious Haws Josselyn Harkins

## 2021-10-03 NOTE — Anesthesia Preprocedure Evaluation (Signed)
Anesthesia Evaluation  Patient identified by MRN, date of birth, ID band Patient awake    Reviewed: Allergy & Precautions, NPO status , Patient's Chart, lab work & pertinent test results  History of Anesthesia Complications Negative for: history of anesthetic complications  Airway Mallampati: III  TM Distance: >3 FB Neck ROM: full    Dental  (+) Chipped, Poor Dentition, Missing   Pulmonary neg shortness of breath, sleep apnea , former smoker,    Pulmonary exam normal        Cardiovascular Exercise Tolerance: Good hypertension, (-) anginaNormal cardiovascular exam     Neuro/Psych  Neuromuscular disease negative psych ROS   GI/Hepatic Neg liver ROS, GERD  Controlled,  Endo/Other  diabetes, Type 2  Renal/GU negative Renal ROS  negative genitourinary   Musculoskeletal   Abdominal   Peds  Hematology negative hematology ROS (+)   Anesthesia Other Findings Past Medical History: No date: Arthritis     Comment:  Left knee No date: Diabetes mellitus without complication (HCC) No date: GERD (gastroesophageal reflux disease) No date: H/O multiple trauma No date: Hyperlipidemia No date: Hypertension No date: Low back pain No date: Lung collapse No date: Shoulder pain     Comment:  left No date: Thyroid disease No date: Wears partial dentures  Past Surgical History: No date: BLADDER SURGERY No date: CATARACT EXTRACTION, BILATERAL No date: CESAREAN SECTION 04/20/2017: COLONOSCOPY WITH PROPOFOL; N/A     Comment:  Procedure: COLONOSCOPY WITH PROPOFOL;  Surgeon: Jonathon Bellows, MD;  Location: [facility] ENDOSCOPY;  Service:               Gastroenterology;  Laterality: N/A; 08/30/2020: COLONOSCOPY WITH PROPOFOL; N/A     Comment:  Procedure: COLONOSCOPY WITH PROPOFOL;  Surgeon: Lucilla Lame, MD;  Location: [facility];  Service:               Endoscopy;  Laterality: N/A; 08/30/2020:  POLYPECTOMY; N/A     Comment:  Procedure: POLYPECTOMY;  Surgeon: Lucilla Lame, MD;                Location: [facility];  Service: Endoscopy;                Laterality: N/A; No date: THYROID SURGERY 03/06/2014: TOOTH EXTRACTION     Reproductive/Obstetrics negative OB ROS                             Anesthesia Physical Anesthesia Plan  ASA: 3  Anesthesia Plan: General   Post-op Pain Management:    Induction: Intravenous  PONV Risk Score and Plan: Propofol infusion and TIVA  Airway Management Planned: Natural Airway and Nasal Cannula  Additional Equipment:   Intra-op Plan:   Post-operative Plan:   Informed Consent: I have reviewed the patients History and Physical, chart, labs and discussed the procedure including the risks, benefits and alternatives for the proposed anesthesia with the patient or authorized representative who has indicated his/her understanding and acceptance.     Dental Advisory Given  Plan Discussed with: Anesthesiologist, CRNA and Surgeon  Anesthesia Plan Comments: (Patient consented for risks of anesthesia including but not limited to:  - adverse reactions to medications - risk of airway placement if required - damage to eyes, teeth, lips or other oral mucosa - nerve  damage due to positioning  - sore throat or hoarseness - Damage to heart, brain, nerves, lungs, other parts of body or loss of life  Patient voiced understanding.)        Anesthesia Quick Evaluation

## 2021-10-03 NOTE — H&P (Signed)
Jonathon Bellows, MD [address], [place], [place], [place], [address] [address] [place], [place], [place], [address] Phone: [phone]  Fax: [phone]  Primary Care Physician:  Gwyneth Sprout, FNP   Pre-Procedure History & Physical: HPI:  DEMESHA BOORMAN is a 66 y.o. female is here for an endoscopy    Past Medical History:  Diagnosis Date   Arthritis    Left knee   Diabetes mellitus without complication (HCC)    GERD (gastroesophageal reflux disease)    H/O multiple trauma    Hyperlipidemia    Hypertension    Low back pain    Lung collapse    Shoulder pain    left   Thyroid disease    Wears partial dentures     Past Surgical History:  Procedure Laterality Date   BLADDER SURGERY     CATARACT EXTRACTION, BILATERAL     CESAREAN SECTION     COLONOSCOPY WITH PROPOFOL N/A 04/20/2017   Procedure: COLONOSCOPY WITH PROPOFOL;  Surgeon: Jonathon Bellows, MD;  Location: [facility] ENDOSCOPY;  Service: Gastroenterology;  Laterality: N/A;   COLONOSCOPY WITH PROPOFOL N/A 08/30/2020   Procedure: COLONOSCOPY WITH PROPOFOL;  Surgeon: Lucilla Lame, MD;  Location: [place];  Service: Endoscopy;  Laterality: N/A;   POLYPECTOMY N/A 08/30/2020   Procedure: POLYPECTOMY;  Surgeon: Lucilla Lame, MD;  Location: [place];  Service: Endoscopy;  Laterality: N/A;   THYROID SURGERY     TOOTH EXTRACTION  03/06/2014    Prior to Admission medications   Medication Sig Start Date End Date Taking? Authorizing Provider  aspirin 81 MG tablet Take 81 mg by mouth daily.   Yes [provider]  CALCIUM-VITAMIN D PO Take 800 mg by mouth daily.   Yes [provider]  carvedilol (COREG) 6.25 MG tablet Take 1 tablet (6.25 mg total) by mouth 2 (two) times daily. 03/04/21  Yes Gwyneth Sprout, FNP  empagliflozin (JARDIANCE) 25 MG TABS tablet Take 1 tablet (25 mg total) by mouth daily before breakfast. 03/30/21  Yes Gwyneth Sprout, FNP  fexofenadine (ALLEGRA) 180 MG tablet  Take 180 mg by mouth daily.   Yes [provider]  glipiZIDE (GLUCOTROL) 10 MG tablet TAKE 1 TABLET BY MOUTH TWICE DAILY 30 MINUTES BEFORE A MEAL 03/30/21  Yes Gwyneth Sprout, FNP  linagliptin (TRADJENTA) 5 MG TABS tablet Take 1 tablet (5 mg total) by mouth daily. 06/22/21  Yes Tally Joe T, FNP  lisinopril (ZESTRIL) 20 MG tablet Take 2 tablets (40 mg total) by mouth daily. 03/30/21  Yes Tally Joe T, FNP  meloxicam (MOBIC) 7.5 MG tablet Take 1 tablet by mouth once daily 09/22/21  Yes Tally Joe T, FNP  metFORMIN (GLUCOPHAGE) 1000 MG tablet Take 1 tablet (1,000 mg total) by mouth 2 (two) times daily. 03/30/21  Yes Tally Joe T, FNP  acetaminophen (TYLENOL) 500 MG tablet Take 500 mg by mouth every 6 (six) hours as needed.    [provider]  atorvastatin (LIPITOR) 20 MG tablet Take 1 tablet (20 mg total) by mouth daily. 06/29/21   Gwyneth Sprout, FNP  carboxymethylcellulose (REFRESH PLUS) 0.5 % SOLN 1 drop 3 (three) times daily as needed.    [provider]  Charcoal Activated (ACTIVATED CHARCOAL PO) Take 1,040 mg by mouth daily.    [provider]  Difluprednate 0.05 % EMUL Place 1 drop into the left eye 4 (four) times daily. Patient not taking: Reported on 09/27/2021 07/29/21  [provider]  gatifloxacin (ZYMAXID) 0.5 % SOLN Place 1 drop into the left eye 4 (four) times daily. Patient not taking: Reported on 09/27/2021 07/29/21   [provider]  glucose blood (ONETOUCH VERIO) test strip Use as instructed 12/17/19   Trinna Post, PA-C  ketorolac (ACULAR) 0.5 % ophthalmic solution SMARTSIG:In Eye(s) Patient not taking: Reported on 09/27/2021 07/29/21   [provider]  olopatadine (PATANOL) 0.1 % ophthalmic solution INSTILL 1 DROP INTO EACH EYE TWICE DAILY Patient not taking: Reported on 09/27/2021 08/02/17   [provider]    Allergies as of 09/01/2021 - Review Complete 09/01/2021  Allergen Reaction Noted   Mevacor  [lovastatin] Other (See Comments) 09/03/2013    Family History  Problem Relation Age of Onset   Heart attack Mother    Stroke Mother    Diabetes Mother    Aneurysm Father    Breast cancer Sister    Diabetes Sister    Ovarian cancer Neg Hx    Colon cancer Neg Hx     Social History   Socioeconomic History   Marital status: Single    Spouse name: Not on file   Number of children: Not on file   Years of education: Not on file   Highest education level: Not on file  Occupational History   Not on file  Tobacco Use   Smoking status: Former    Types: Cigarettes   Smokeless tobacco: Never  Vaping Use   Vaping Use: Never used  Substance and Sexual Activity   Alcohol use: No   Drug use: No   Sexual activity: Not Currently    Birth control/protection: Post-menopausal  Other Topics Concern   Not on file  Social History Narrative   Not on file   Social Determinants of Health   Financial Resource Strain: Low Risk  (06/06/2021)   Overall Financial Resource Strain (CARDIA)    Difficulty of Paying Living Expenses: Not hard at all  Food Insecurity: No Food Insecurity (06/06/2021)   Hunger Vital Sign    Worried About Running Out of Food in the Last Year: Never true    [place] in the Last Year: Never true  Transportation Needs: No Transportation Needs (06/06/2021)   PRAPARE - Hydrologist (Medical): No    Lack of Transportation (Non-Medical): No  Physical Activity: Inactive (06/06/2021)   Exercise Vital Sign    Days of Exercise per Week: 0 days    Minutes of Exercise per Session: 0 min  Stress: No Stress Concern Present (06/06/2021)   [place]    Feeling of Stress : Not at all  Social Connections: Moderately Integrated (06/06/2021)   Social Connection and Isolation Panel [NHANES]    Frequency of Communication with Friends and Family: More than three times a week    Frequency of  Social Gatherings with Friends and Family: More than three times a week    Attends Religious Services: More than 4 times per year    Active Member of [employer] or Organizations: Yes    Attends Archivist Meetings: 1 to 4 times per year    Marital Status: Never married  Intimate Partner Violence: Not At Risk (06/06/2021)   Humiliation, Afraid, Rape, and Kick questionnaire    Fear of Current or Ex-Partner: No    Emotionally Abused: No    Physically Abused: No    Sexually Abused: No  Review of Systems: See HPI, otherwise negative ROS  Physical Exam: BP (!) 141/97   Pulse 88   Temp (!) 96 F (35.6 C) (Temporal)   Resp 16   Ht '5\' 2"'$  (1.575 m)   Wt 104.3 kg   SpO2 97%   BMI 42.07 kg/m  General:   Alert,  pleasant and cooperative in NAD Head:  Normocephalic and atraumatic. Neck:  Supple; no masses or thyromegaly. Lungs:  Clear throughout to auscultation, normal respiratory effort.    Heart:  +S1, +S2, Regular rate and rhythm, No edema. Abdomen:  Soft, nontender and nondistended. Normal bowel sounds, without guarding, and without rebound.   Neurologic:  Alert and  oriented x4;  grossly normal neurologically.  Impression/Plan: MAELANI YARBRO is here for an endoscopy  to be performed for  evaluation of rule out gastric outlet obstruction for gastroparesis    Risks, benefits, limitations, and alternatives regarding endoscopy have been reviewed with the patient.  Questions have been answered.  All parties agreeable.   Jonathon Bellows, MD  10/03/2021, 8:27 AM

## 2021-10-04 ENCOUNTER — Encounter: Payer: Self-pay | Admitting: Gastroenterology

## 2021-10-06 LAB — HM DIABETES EYE EXAM

## 2021-10-18 ENCOUNTER — Other Ambulatory Visit: Payer: Self-pay | Admitting: Family Medicine

## 2021-10-18 DIAGNOSIS — I10 Essential (primary) hypertension: Secondary | ICD-10-CM

## 2021-10-18 NOTE — Telephone Encounter (Signed)
Requested Prescriptions  Pending Prescriptions Disp Refills  . carvedilol (COREG) 6.25 MG tablet [Pharmacy Med Name: Carvedilol 6.25 MG Oral Tablet] 180 tablet 0    Sig: Take 1 tablet by mouth twice daily     Cardiovascular: Beta Blockers 3 Failed - 10/18/2021 11:06 AM      Failed - Cr in normal range and within 360 days    Creatinine  Date Value Ref Range Status  12/16/2011 0.61 0.60 - 1.30 mg/dL Final   Creatinine, Ser  Date Value Ref Range Status  06/29/2021 0.55 (L) 0.57 - 1.00 mg/dL Final         Passed - AST in normal range and within 360 days    AST  Date Value Ref Range Status  06/29/2021 13 0 - 40 IU/L Final   SGOT(AST)  Date Value Ref Range Status  12/16/2011 71 (H) 15 - 37 Unit/L Final         Passed - ALT in normal range and within 360 days    ALT  Date Value Ref Range Status  06/29/2021 10 0 - 32 IU/L Final   SGPT (ALT)  Date Value Ref Range Status  12/16/2011 47 12 - 78 U/L Final         Passed - Last BP in normal range    BP Readings from Last 1 Encounters:  10/03/21 121/80         Passed - Last Heart Rate in normal range    Pulse Readings from Last 1 Encounters:  10/03/21 91         Passed - Valid encounter within last 6 months    Recent Outpatient Visits          3 weeks ago Type 2 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, without long-term current use of insulin, unspecified laterality [facility])   [facility] Gwyneth Sprout, FNP   3 months ago Annual physical exam   [facility] Tally Joe T, FNP   5 months ago Gross hematuria   [facility] Birdie Sons, MD   6 months ago Type 2 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, without long-term current use of insulin, unspecified laterality [facility])   [facility] Tally Joe T, FNP   10 months ago Type 2 diabetes mellitus with mild nonproliferative retinopathy  without macular edema, without long-term current use of insulin, unspecified laterality [facility])   [facility] Gwyneth Sprout, FNP      Future Appointments            In 1 month Amalia Hailey, Nyoka Lint, MD [facility]   In 2 months Gwyneth Sprout, [place], [place]

## 2021-11-16 ENCOUNTER — Ambulatory Visit
Admission: RE | Admit: 2021-11-16 | Discharge: 2021-11-16 | Disposition: A | Discharge status: Home / Self Care | Payer: Medicare Other | Source: Ambulatory Visit | Attending: Family Medicine | Admitting: Family Medicine

## 2021-11-16 DIAGNOSIS — Z780 Asymptomatic menopausal state: Secondary | ICD-10-CM | POA: Insufficient documentation

## 2021-11-16 DIAGNOSIS — Z13820 Encounter for screening for osteoporosis: Secondary | ICD-10-CM | POA: Insufficient documentation

## 2021-11-16 DIAGNOSIS — E119 Type 2 diabetes mellitus without complications: Secondary | ICD-10-CM | POA: Diagnosis not present

## 2021-11-16 NOTE — Progress Notes (Signed)
Normal bone density screening; normal bone strength noted. Can use Vit D 800 IU and Calcium 1200 mg to assist regular exercise for bone health. Can repeat DEXA in 3 years if desired.  Please let us know if you have any questions.  Thank you,  Tally Joe, FNP

## 2021-12-07 ENCOUNTER — Encounter: Payer: Self-pay | Admitting: Obstetrics and Gynecology

## 2021-12-07 ENCOUNTER — Ambulatory Visit (INDEPENDENT_AMBULATORY_CARE_PROVIDER_SITE_OTHER): Payer: Medicare Other | Admitting: Obstetrics and Gynecology

## 2021-12-07 VITALS — BP 97/64 | HR 67 | Ht 62.0 in | Wt 227.7 lb

## 2021-12-07 DIAGNOSIS — Z23 Encounter for immunization: Secondary | ICD-10-CM

## 2021-12-07 DIAGNOSIS — Z01419 Encounter for gynecological examination (general) (routine) without abnormal findings: Secondary | ICD-10-CM

## 2021-12-07 DIAGNOSIS — Z1231 Encounter for screening mammogram for malignant neoplasm of breast: Secondary | ICD-10-CM

## 2021-12-07 NOTE — Progress Notes (Signed)
Patients presents for annual exam today. She states doing well. Patient is due for mammogram, ordered. Annual labs are declined. Patient states no other questions or concerns at this time. Flu shot administered.

## 2021-12-07 NOTE — Progress Notes (Signed)
HPI:      [name] is a 66 y.o. G2P1011 who LMP was No LMP recorded. Patient is postmenopausal.  Subjective:   She presents today for her annual examination.  She has no GYN complaints.  She has aged out of Pap smears.    Hx: The following portions of the patient's history were reviewed and updated as appropriate:             She  has a past medical history of Arthritis, Diabetes mellitus without complication ([place]), GERD (gastroesophageal reflux disease), H/O multiple trauma, Hyperlipidemia, Hypertension, Low back pain, Lung collapse, Shoulder pain, Thyroid disease, and Wears partial dentures. She does not have any pertinent problems on file. She  has a past surgical history that includes Thyroid surgery; Bladder surgery; Cesarean section; Tooth extraction (03/06/2014); Colonoscopy with propofol (N/A, 04/20/2017); Colonoscopy with propofol (N/A, 08/30/2020); polypectomy (N/A, 08/30/2020); Cataract extraction, bilateral; and Esophagogastroduodenoscopy (egd) with propofol (N/A, 10/03/2021). Her family history includes Aneurysm in her father; Breast cancer in her sister; Diabetes in her mother and sister; Heart attack in her mother; Stroke in her mother. She  reports that she has quit smoking. Her smoking use included cigarettes. She has never used smokeless tobacco. She reports that she does not drink alcohol and does not use drugs. She has a current medication list which includes the following prescription(s): acetaminophen, aspirin, atorvastatin, calcium-vitamin d, carboxymethylcellulose, carvedilol, charcoal activated, empagliflozin, fexofenadine, glipizide, ketorolac, linagliptin, lisinopril, meloxicam, metformin, and onetouch verio. She is allergic to mevacor [lovastatin].       Review of Systems:  Review of Systems  Constitutional: Denied constitutional symptoms, night sweats, recent illness, fatigue, fever, insomnia and weight loss.  Eyes: Denied eye symptoms, eye pain,  photophobia, vision change and visual disturbance.  Ears/Nose/Throat/Neck: Denied ear, nose, throat or neck symptoms, hearing loss, nasal discharge, sinus congestion and sore throat.  Cardiovascular: Denied cardiovascular symptoms, arrhythmia, chest pain/pressure, edema, exercise intolerance, orthopnea and palpitations.  Respiratory: Denied pulmonary symptoms, asthma, pleuritic pain, productive sputum, cough, dyspnea and wheezing.  Gastrointestinal: Denied, gastro-esophageal reflux, melena, nausea and vomiting.  Genitourinary: Denied genitourinary symptoms including symptomatic vaginal discharge, pelvic relaxation issues, and urinary complaints.  Musculoskeletal: Denied musculoskeletal symptoms, stiffness, swelling, muscle weakness and myalgia.  Dermatologic: She has a spot on her left side several inches below her axilla that she says is dark in color and seems to be getting bigger.  Neurologic: Denied neurology symptoms, dizziness, headache, neck pain and syncope.  Psychiatric: Denied psychiatric symptoms, anxiety and depression.  Endocrine: Denied endocrine symptoms including hot flashes and night sweats.   Meds:   Current Outpatient Medications on File Prior to Visit  Medication Sig Dispense Refill   acetaminophen (TYLENOL) 500 MG tablet Take 500 mg by mouth every 6 (six) hours as needed.     aspirin 81 MG tablet Take 81 mg by mouth daily.     atorvastatin (LIPITOR) 20 MG tablet Take 1 tablet (20 mg total) by mouth daily. 90 tablet 3   CALCIUM-VITAMIN D PO Take 800 mg by mouth daily.     carboxymethylcellulose (REFRESH PLUS) 0.5 % SOLN 1 drop 3 (three) times daily as needed.     carvedilol (COREG) 6.25 MG tablet Take 1 tablet by mouth twice daily 180 tablet 0   Charcoal Activated (ACTIVATED CHARCOAL PO) Take 1,040 mg by mouth daily.     empagliflozin (JARDIANCE) 25 MG TABS tablet Take 1 tablet (25 mg total) by mouth daily before breakfast. 90 tablet 3  fexofenadine (ALLEGRA) 180 MG  tablet Take 180 mg by mouth daily.     glipiZIDE (GLUCOTROL) 10 MG tablet TAKE 1 TABLET BY MOUTH TWICE DAILY 30 MINUTES BEFORE A MEAL 180 tablet 3   ketorolac (ACULAR) 0.5 % ophthalmic solution      linagliptin (TRADJENTA) 5 MG TABS tablet Take 1 tablet (5 mg total) by mouth daily. 90 tablet 1   lisinopril (ZESTRIL) 20 MG tablet Take 2 tablets (40 mg total) by mouth daily. 180 tablet 3   meloxicam (MOBIC) 7.5 MG tablet Take 1 tablet by mouth once daily 30 tablet 0   metFORMIN (GLUCOPHAGE) 1000 MG tablet Take 1 tablet (1,000 mg total) by mouth 2 (two) times daily. 180 tablet 3   glucose blood (ONETOUCH VERIO) test strip Use as instructed 100 each 12   No current facility-administered medications on file prior to visit.     Objective:     Vitals:   12/07/21 0926  BP: 97/64  Pulse: 67    Filed Weights   12/07/21 0926  Weight: 227 lb 11.2 oz (103.3 kg)              Physical examination General NAD, Conversant  HEENT Atraumatic; Op clear with mmm.  Normo-cephalic. Pupils reactive. Anicteric sclerae  Thyroid/Neck Smooth without nodularity or enlargement. Normal ROM.  Neck Supple.  Skin 1 cm inclusion cyst noted on her left chest.  Inclusion body removed.  No bleeding.  Breasts: No masses or discharge.  Symmetric.  No axillary adenopathy.  Lungs: Clear to auscultation.No rales or wheezes. Normal Respiratory effort, no retractions.  Heart: NSR.  No murmurs or rubs appreciated. No periferal edema  Abdomen: Soft.  Non-tender.  No masses.  No HSM. No hernia  Extremities: Moves all appropriately.  Normal ROM for age. No lymphadenopathy.  Neuro: Oriented to PPT.  Normal mood. Normal affect.     Pelvic:   Vulva: Normal appearance.  No lesions.  Vagina: No lesions or abnormalities noted.  Support: Normal pelvic support.  Urethra No masses tenderness or scarring.  Meatus Normal size without lesions or prolapse.  Cervix: Normal appearance.  No lesions.  Anus: Normal exam.  No lesions.   Perineum: Normal exam.  No lesions.        Bimanual   Uterus: Normal size.  Non-tender.  Mobile.  AV.  Adnexae: No masses.  Non-tender to palpation.  Cul-de-sac: Negative for abnormality.   Exam limited by patient body habitus  Assessment:    G2P1011 Patient Active Problem List   Diagnosis Date Noted   Screening for osteoporosis 09/27/2021   Need for vaccination against Streptococcus pneumoniae 06/29/2021   Annual physical exam 06/29/2021   Need for pneumococcal 20-valent conjugate vaccination 06/29/2021   Hyperlipidemia associated with type 2 diabetes mellitus ([place]) 03/30/2021   Hypertension associated with diabetes ([place]) 03/30/2021   Right wrist pain 03/30/2021   Fall 03/30/2021   Urinary frequency 03/30/2021   Hx of colonic polyps    Benign neoplasm of cecum    Type 2 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 Well woman exam with routine gynecological exam   2. Screening mammogram for breast cancer   3. Need for influenza vaccination  Plan:            1.  Basic Screening Recommendations The basic screening recommendations for asymptomatic women were discussed with the patient during her visit.  The age-appropriate recommendations were discussed with her and the rational for the tests reviewed.  When I am informed by the patient that another primary care physician has previously obtained the age-appropriate tests and they are up-to-date, only outstanding tests are ordered and referrals given as necessary.  Abnormal results of tests will be discussed with her when all of her results are completed.  Routine preventative health maintenance measures emphasized:  Exercise/Diet/Weight control, Tobacco Warnings, Alcohol/Substance use risks and Stress Management Mammogram ordered 2.  Expectant management of inclusion cyst.  If it returns or does not resolve she is to contact us in the next few weeks and I have advised her on dermatology referral.  Orders Orders Placed This Encounter  Procedures   MM DIGITAL SCREENING BILATERAL   Flu vaccine greater than or equal to 3yo preservative free IM    No orders of the defined types were placed in this encounter.         F/U  Return in about 1 year (around 12/08/2022) for Annual Physical.  Finis Bud, M.D. 12/07/2021 9:55 AM

## 2021-12-22 NOTE — Progress Notes (Unsigned)
Established patient visit   Patient: Chelsea Gomez   DOB: 1956-01-16   66 y.o. Female  MRN: [id number] Visit Date: 12/28/2021  Today's healthcare provider: Gwyneth Sprout, FNP   No chief complaint on file.  Subjective    HPI  Diabetes Mellitus Type II, Follow-up  Lab Results  Component Value Date   HGBA1C 8.1 (A) 09/27/2021   HGBA1C 9.2 (H) 06/29/2021   HGBA1C 7.9 (A) 03/30/2021   Wt Readings from Last 3 Encounters:  12/07/21 227 lb 11.2 oz (103.3 kg)  10/03/21 230 lb (104.3 kg)  09/27/21 230 lb (104.3 kg)   Last seen for diabetes 3 months ago.  Management since then includes jardiance 25 mg, glipizide 10 mg BID, tradjenta 5 mg, metformin 1000 mg BID. She reports {excellent/good/fair/poor:19665} compliance with treatment. She {is/is not:21021397} having side effects. {document side effects if present:1} Symptoms: {Yes/No:20286} fatigue {Yes/No:20286} foot ulcerations  {Yes/No:20286} appetite changes {Yes/No:20286} nausea  {Yes/No:20286} paresthesia of the feet  {Yes/No:20286} polydipsia  {Yes/No:20286} polyuria {Yes/No:20286} visual disturbances   {Yes/No:20286} vomiting     Home blood sugar records: {diabetes glucometry results:16657}  Episodes of hypoglycemia? {Yes/No:20286} {enter symptoms and frequency of symptoms if yes:1}   Current insulin regiment: {enter 'none' or type of insulin and number of units taken with each dose of each insulin formulation that the patient is taking:1} Most Recent Eye Exam: *** {Current exercise:16438:::1} {Current diet habits:16563:::1}  Pertinent Labs: Lab Results  Component Value Date   CHOL 142 06/29/2021   HDL 39 (L) 06/29/2021   LDLCALC 81 06/29/2021   TRIG 123 06/29/2021   CHOLHDL 3.6 06/29/2021   Lab Results  Component Value Date   NA 141 06/29/2021   K 4.4 06/29/2021   CREATININE 0.55 (L) 06/29/2021   EGFR 101 06/29/2021   LABMICR 7.2 03/30/2021      ---------------------------------------------------------------------------------------------------   Medications: Outpatient Medications Prior to Visit  Medication Sig   acetaminophen (TYLENOL) 500 MG tablet Take 500 mg by mouth every 6 (six) hours as needed.   aspirin 81 MG tablet Take 81 mg by mouth daily.   atorvastatin (LIPITOR) 20 MG tablet Take 1 tablet (20 mg total) by mouth daily.   CALCIUM-VITAMIN D PO Take 800 mg by mouth daily.   carboxymethylcellulose (REFRESH PLUS) 0.5 % SOLN 1 drop 3 (three) times daily as needed.   carvedilol (COREG) 6.25 MG tablet Take 1 tablet by mouth twice daily   Charcoal Activated (ACTIVATED CHARCOAL PO) Take 1,040 mg by mouth daily.   empagliflozin (JARDIANCE) 25 MG TABS tablet Take 1 tablet (25 mg total) by mouth daily before breakfast.   fexofenadine (ALLEGRA) 180 MG tablet Take 180 mg by mouth daily.   glipiZIDE (GLUCOTROL) 10 MG tablet TAKE 1 TABLET BY MOUTH TWICE DAILY 30 MINUTES BEFORE A MEAL   glucose blood (ONETOUCH VERIO) test strip Use as instructed   ketorolac (ACULAR) 0.5 % ophthalmic solution    linagliptin (TRADJENTA) 5 MG TABS tablet Take 1 tablet (5 mg total) by mouth daily.   lisinopril (ZESTRIL) 20 MG tablet Take 2 tablets (40 mg total) by mouth daily.   meloxicam (MOBIC) 7.5 MG tablet Take 1 tablet by mouth once daily   metFORMIN (GLUCOPHAGE) 1000 MG tablet Take 1 tablet (1,000 mg total) by mouth 2 (two) times daily.   No facility-administered medications prior to visit.    Review of Systems  {Labs  Heme  Chem  Endocrine  Serology  Results Review (optional):23779}   Objective  There were no vitals taken for this visit. {Show previous vital signs (optional):23777}  Physical Exam  ***  No results found for any visits on 12/28/21.  Assessment & Plan     ***  No follow-ups on file.      {provider attestation***:1}   Elise T Payne, FNP  [facility] [phone] (phone) [phone]  (fax)  [facility]  

## 2021-12-28 ENCOUNTER — Encounter: Payer: Self-pay | Admitting: Family Medicine

## 2021-12-28 ENCOUNTER — Ambulatory Visit (INDEPENDENT_AMBULATORY_CARE_PROVIDER_SITE_OTHER): Payer: Medicare Other | Admitting: Family Medicine

## 2021-12-28 VITALS — BP 105/68 | HR 78 | Temp 98.5°F | Resp 16 | Ht 62.0 in | Wt 228.0 lb

## 2021-12-28 DIAGNOSIS — E113299 Type 2 diabetes mellitus with mild nonproliferative diabetic retinopathy without macular edema, unspecified eye: Secondary | ICD-10-CM

## 2021-12-28 MED ORDER — ONETOUCH VERIO VI STRP: ORAL_STRIP | 12 refills | Status: AC

## 2021-12-28 NOTE — Assessment & Plan Note (Signed)
Chronic, previously elevated >7% Repeat A1c On statin 20 mg On ace- lisinopri 20 mg  Continue jardiance 25, glipizide 10, tradjenta 5, metformin 1000 mg Continue to recommend balanced, lower carb meals. Smaller meal size, adding snacks. Choosing water as drink of choice and increasing purposeful exercise.

## 2021-12-29 LAB — HEMOGLOBIN A1C
Est. average glucose Bld gHb Est-mCnc: 171 mg/dL
Hgb A1c MFr Bld: 7.6 % — ABNORMAL HIGH (ref 4.8–5.6)

## 2021-12-29 NOTE — Progress Notes (Signed)
A1c improved; now 7.6%  Continue to recommend balanced, lower carb meals. Smaller meal size, adding snacks. Choosing water as drink of choice and increasing purposeful exercise.  Keep up the hard work over the holidays.  Gwyneth Sprout, [address] [place], [place] [address] [phone] (phone) [phone] (fax) [place]

## 2022-02-17 ENCOUNTER — Other Ambulatory Visit: Payer: Self-pay | Admitting: Family Medicine

## 2022-02-17 DIAGNOSIS — E1169 Type 2 diabetes mellitus with other specified complication: Secondary | ICD-10-CM

## 2022-02-17 DIAGNOSIS — I152 Hypertension secondary to endocrine disorders: Secondary | ICD-10-CM

## 2022-03-11 ENCOUNTER — Other Ambulatory Visit: Payer: Self-pay | Admitting: Family Medicine

## 2022-03-11 DIAGNOSIS — I10 Essential (primary) hypertension: Secondary | ICD-10-CM

## 2022-03-28 ENCOUNTER — Ambulatory Visit: Payer: Medicare Other | Admitting: Family Medicine

## 2022-03-28 NOTE — Progress Notes (Unsigned)
I,Koralyn Prestage R Welda Azzarello,acting as a Education administrator for Gwyneth Sprout, FNP.,have documented all relevant documentation on the behalf of Gwyneth Sprout, FNP,as directed by  Gwyneth Sprout, FNP while in the presence of Gwyneth Sprout, FNP.  Established patient visit  Patient: Chelsea Gomez   DOB: [birth year]   67 y.o. Female  MRN: [id number] Visit Date: 03/29/2022  Today's healthcare provider: Gwyneth Sprout, FNP  Re Introduced to nurse practitioner role and practice setting.  All questions answered.  Discussed provider/patient relationship and expectations.  Subjective    HPI  Diabetes Mellitus Type II, Follow-up  Lab Results  Component Value Date   HGBA1C 8.3 (A) 03/29/2022   HGBA1C 7.6 (H) 12/28/2021   HGBA1C 8.1 (A) 09/27/2021   Wt Readings from Last 3 Encounters:  03/29/22 225 lb (102.1 kg)  12/28/21 228 lb (103.4 kg)  12/07/21 227 lb 11.2 oz (103.3 kg)   Last seen for diabetes 3 months ago.  Management since then includes: On statin 20 mg On ace- lisinopri 20 mg  Continue jardiance 25, glipizide 10, tradjenta 5, metformin 1000 mg Continue to recommend balanced, lower carb meals. Smaller meal size, adding snacks. Choosing water as drink of choice and increasing purposeful exercise. She reports excellent compliance with treatment. She is not having side effects.  Episodes of hypoglycemia? No   Pertinent Labs: Lab Results  Component Value Date   CHOL 142 06/29/2021   HDL 39 (L) 06/29/2021   LDLCALC 81 06/29/2021   TRIG 123 06/29/2021   CHOLHDL 3.6 06/29/2021   Lab Results  Component Value Date   NA 141 06/29/2021   K 4.4 06/29/2021   CREATININE 0.55 (L) 06/29/2021   EGFR 101 06/29/2021   LABMICR 7.2 03/30/2021   MICRALBCREAT 18 03/30/2021     ---------------------------------------------------------------------------------------------------   Medications: Outpatient Medications Prior to Visit  Medication Sig   acetaminophen (TYLENOL) 500 MG tablet Take 500 mg by mouth  every 6 (six) hours as needed.   aspirin 81 MG tablet Take 81 mg by mouth daily.   atorvastatin (LIPITOR) 20 MG tablet Take 1 tablet (20 mg total) by mouth daily.   CALCIUM-VITAMIN D PO Take 800 mg by mouth daily.   carboxymethylcellulose (REFRESH PLUS) 0.5 % SOLN 1 drop 3 (three) times daily as needed.   carvedilol (COREG) 6.25 MG tablet Take 1 tablet by mouth twice daily   Charcoal Activated (ACTIVATED CHARCOAL PO) Take 1,040 mg by mouth daily.   empagliflozin (JARDIANCE) 25 MG TABS tablet Take 1 tablet (25 mg total) by mouth daily before breakfast.   fexofenadine (ALLEGRA) 180 MG tablet Take 180 mg by mouth daily.   glipiZIDE (GLUCOTROL) 10 MG tablet TAKE 1 TABLET BY MOUTH TWICE DAILY 30 MINUTES BEFORE A MEAL   glucose blood (ONETOUCH VERIO) test strip Use as instructed   glucose blood (ONETOUCH VERIO) test strip Use as instructed   ketorolac (ACULAR) 0.5 % ophthalmic solution    linagliptin (TRADJENTA) 5 MG TABS tablet Take 1 tablet (5 mg total) by mouth daily.   lisinopril (ZESTRIL) 20 MG tablet Take 2 tablets (40 mg total) by mouth daily.   meloxicam (MOBIC) 7.5 MG tablet Take 1 tablet by mouth once daily   metFORMIN (GLUCOPHAGE) 1000 MG tablet Take 1 tablet (1,000 mg total) by mouth 2 (two) times daily.   No facility-administered medications prior to visit.    Review of Systems    Objective    BP 124/64 (BP Location: Right Arm, Patient Position:  Sitting, Cuff Size: Large)   Pulse 84   Temp 98.5 F (36.9 C) (Oral)   Wt 225 lb (102.1 kg)   SpO2 98%   BMI 41.15 kg/m   Physical Exam Vitals and nursing note reviewed.  Constitutional:      General: She is not in acute distress.    Appearance: Normal appearance. She is obese. She is not ill-appearing, toxic-appearing or diaphoretic.  HENT:     Head: Normocephalic and atraumatic.  Cardiovascular:     Rate and Rhythm: Normal rate and regular rhythm.     Pulses: Normal pulses.     Heart sounds: Normal heart sounds. No murmur  heard.    No friction rub. No gallop.  Pulmonary:     Effort: Pulmonary effort is normal. No respiratory distress.     Breath sounds: Normal breath sounds. No stridor. No wheezing, rhonchi or rales.  Chest:     Chest wall: No tenderness.  Musculoskeletal:        General: No swelling, tenderness, deformity or signs of injury. Normal range of motion.     Right lower leg: No edema.     Left lower leg: No edema.  Skin:    General: Skin is warm and dry.     Capillary Refill: Capillary refill takes less than 2 seconds.     Coloration: Skin is not jaundiced or pale.     Findings: No bruising, erythema, lesion or rash.  Neurological:     General: No focal deficit present.     Mental Status: She is alert and oriented to person, place, and time. Mental status is at baseline.     Cranial Nerves: No cranial nerve deficit.     Sensory: No sensory deficit.     Motor: No weakness.     Coordination: Coordination normal.  Psychiatric:        Mood and Affect: Mood normal.        Behavior: Behavior normal.        Thought Content: Thought content normal.        Judgment: Judgment normal.     Results for orders placed or performed in visit on 03/29/22  POCT HgB A1C  Result Value Ref Range   Hemoglobin A1C 8.3 (A) 4.0 - 5.6 %   HbA1c POC (<> result, manual entry)     HbA1c, POC (prediabetic range)     HbA1c, POC (controlled diabetic range)      Assessment & Plan     Problem List Items Addressed This Visit       Cardiovascular and Mediastinum   Hypertension associated with diabetes ([place])    Chronic, stable Goal <130/<80 Continue Coreg 6.25 mg BID with 20 mg Lisinopril         Endocrine   Type 2 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, without long-term current use of insulin ([facility]) - Primary    Chronic, previously elevated; unfortunately has worsened in setting of dietary non adherence  Return in 3 months Defer medication changes at this time   Management since then includes: On statin 20 mg On ace- lisinopri 20 mg  Continue jardiance 25, glipizide 10, tradjenta 5, metformin 1000 mg Continue to recommend balanced, lower carb meals. Smaller meal size, adding snacks. Choosing water as drink of choice and increasing purposeful exercise. She reports excellent compliance with treatment. She is not having side effects.  Episodes of hypoglycemia? No       Relevant Orders   POCT  HgB A1C (Completed)   Urine Microalbumin w/creat. ratio     Other   Morbid obesity (HCC)    Chronic, stable Body mass index is 41.15 kg/m. Discussed importance of healthy weight management Discussed diet and exercise       Return in about 3 months (around 06/28/2022) for chonic disease management, T2DM management.     Vonna Kotyk, FNP, have reviewed all documentation for this visit. The documentation on 03/29/22 for the exam, diagnosis, procedures, and orders are all accurate and complete.  Gwyneth Sprout, [place] [phone] (phone) [phone] (fax)  [place]

## 2022-03-29 ENCOUNTER — Encounter: Payer: Self-pay | Admitting: Family Medicine

## 2022-03-29 ENCOUNTER — Ambulatory Visit (INDEPENDENT_AMBULATORY_CARE_PROVIDER_SITE_OTHER): Payer: Medicare Other | Admitting: Family Medicine

## 2022-03-29 VITALS — BP 124/64 | HR 84 | Temp 98.5°F | Wt 225.0 lb

## 2022-03-29 DIAGNOSIS — E113299 Type 2 diabetes mellitus with mild nonproliferative diabetic retinopathy without macular edema, unspecified eye: Secondary | ICD-10-CM | POA: Diagnosis not present

## 2022-03-29 DIAGNOSIS — I152 Hypertension secondary to endocrine disorders: Secondary | ICD-10-CM

## 2022-03-29 DIAGNOSIS — E1159 Type 2 diabetes mellitus with other circulatory complications: Secondary | ICD-10-CM | POA: Diagnosis not present

## 2022-03-29 LAB — POCT GLYCOSYLATED HEMOGLOBIN (HGB A1C): Hemoglobin A1C: 8.3 % — AB (ref 4.0–5.6)

## 2022-03-29 NOTE — Assessment & Plan Note (Signed)
Chronic, stable Body mass index is 41.15 kg/m. Discussed importance of healthy weight management Discussed diet and exercise

## 2022-03-29 NOTE — Assessment & Plan Note (Signed)
Chronic, previously elevated; unfortunately has worsened in setting of dietary non adherence  Return in 3 months Defer medication changes at this time  Management since then includes: On statin 20 mg On ace- lisinopri 20 mg  Continue jardiance 25, glipizide 10, tradjenta 5, metformin 1000 mg Continue to recommend balanced, lower carb meals. Smaller meal size, adding snacks. Choosing water as drink of choice and increasing purposeful exercise. She reports excellent compliance with treatment. She is not having side effects.  Episodes of hypoglycemia? No

## 2022-03-29 NOTE — Assessment & Plan Note (Signed)
Chronic, stable Goal <130/<80 Continue Coreg 6.25 mg BID with 20 mg Lisinopril

## 2022-03-29 NOTE — Patient Instructions (Signed)
The CDC recommends two doses of Shingrix (the new shingles vaccine) separated by 2 to 6 months for adults age 68 years and older. I recommend checking with your insurance plan regarding coverage for this vaccine.    

## 2022-03-30 LAB — MICROALBUMIN / CREATININE URINE RATIO
Creatinine, Urine: 58.5 mg/dL
Microalb/Creat Ratio: 16 mg/g creat (ref 0–29)
Microalbumin, Urine: 9.6 ug/mL

## 2022-03-30 LAB — SPECIMEN STATUS REPORT

## 2022-06-08 ENCOUNTER — Other Ambulatory Visit: Payer: Self-pay | Admitting: Family Medicine

## 2022-06-08 DIAGNOSIS — E113299 Type 2 diabetes mellitus with mild nonproliferative diabetic retinopathy without macular edema, unspecified eye: Secondary | ICD-10-CM

## 2022-06-08 NOTE — Telephone Encounter (Signed)
Requested Prescriptions  Pending Prescriptions Disp Refills   TRADJENTA 5 MG TABS tablet [Pharmacy Med Name: Tradjenta 5 MG Oral Tablet] 90 tablet 0    Sig: Take 1 tablet by mouth once daily     Endocrinology:  Diabetes - DPP-4 Inhibitors - linagliptin Failed - 06/08/2022 12:18 PM      Failed - HBA1C is between 0 and 7.9 and within 180 days    Hemoglobin A1C  Date Value Ref Range Status  03/29/2022 8.3 (A) 4.0 - 5.6 % Final   Hgb A1c MFr Bld  Date Value Ref Range Status  12/28/2021 7.6 (H) 4.8 - 5.6 % Final    Comment:             Prediabetes: 5.7 - 6.4          Diabetes: >6.4          Glycemic control for adults with diabetes: <7.0          Passed - Valid encounter within last 6 months    Recent Outpatient Visits           2 months ago Type 2 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, without long-term current use of insulin, unspecified laterality [facility])   [place] Tally Joe T, FNP   5 months ago Type 2 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, without long-term current use of insulin, unspecified laterality [facility])   [place] Tally Joe T, FNP   8 months ago Type 2 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, without long-term current use of insulin, unspecified laterality [facility])   [place] Gwyneth Sprout, FNP   11 months ago Annual physical exam   [facility] Gwyneth Sprout, FNP   1 year ago Gross hematuria   [facility] Birdie Sons, MD       Future Appointments             In 2 weeks Gwyneth Sprout, [place], [facility]

## 2022-06-14 ENCOUNTER — Ambulatory Visit (INDEPENDENT_AMBULATORY_CARE_PROVIDER_SITE_OTHER): Payer: Medicare Other

## 2022-06-14 VITALS — Ht 62.0 in | Wt 225.0 lb

## 2022-06-14 DIAGNOSIS — Z0000 Encounter for general adult medical examination without abnormal findings: Secondary | ICD-10-CM

## 2022-06-14 NOTE — Progress Notes (Signed)
I connected with  [name] on 06/14/22 by a audio enabled telemedicine application and verified that I am speaking with the correct person using two identifiers.  Patient Location: Home  Provider Location: Office/Clinic  I discussed the limitations of evaluation and management by telemedicine. The patient expressed understanding and agreed to proceed.  Subjective:   Chelsea Gomez is a 67 y.o. female who presents for Medicare Annual (Subsequent) preventive examination.  Review of Systems    Cardiac Risk Factors include: advanced age (>77men, >57 women);diabetes mellitus;dyslipidemia;hypertension;obesity (BMI >30kg/m2);sedentary lifestyle    Obective:    Today's Vitals   06/14/22 1108  Weight: 225 lb (102.1 kg)  Height: 5\' 2"  (1.575 m)   Body mass index is 41.15 kg/m.     06/14/2022   11:21 AM 10/03/2021    7:36 AM 06/06/2021    2:11 PM 08/30/2020    9:14 AM 04/20/2017    7:30 AM 08/14/2014   11:18 AM  Advanced Directives  Does Patient Have a Medical Advance Directive? No No No No No Yes  Type of Advance Directive      Healthcare Power of Attorney  Does patient want to make changes to medical advance directive?      No - Patient declined  Copy of Healthcare Power of Attorney in Chart?      No - copy requested  Would patient like information on creating a medical advance directive?   No - Patient declined No - Patient declined      Current Medications (verified) Outpatient Encounter Medications as of 06/14/2022  Medication Sig   acetaminophen (TYLENOL) 500 MG tablet Take 500 mg by mouth every 6 (six) hours as needed.   aspirin 81 MG tablet Take 81 mg by mouth daily.   atorvastatin (LIPITOR) 20 MG tablet Take 1 tablet (20 mg total) by mouth daily.   CALCIUM-VITAMIN D PO Take 800 mg by mouth daily.   carboxymethylcellulose (REFRESH PLUS) 0.5 % SOLN 1 drop 3 (three) times daily as needed.   carvedilol (COREG) 6.25 MG tablet Take 1 tablet by mouth twice daily   Charcoal  Activated (ACTIVATED CHARCOAL PO) Take 1,040 mg by mouth daily.   empagliflozin (JARDIANCE) 25 MG TABS tablet Take 1 tablet (25 mg total) by mouth daily before breakfast.   fexofenadine (ALLEGRA) 180 MG tablet Take 180 mg by mouth daily.   glipiZIDE (GLUCOTROL) 10 MG tablet TAKE 1 TABLET BY MOUTH TWICE DAILY 30 MINUTES BEFORE A MEAL   glucose blood (ONETOUCH VERIO) test strip Use as instructed   glucose blood (ONETOUCH VERIO) test strip Use as instructed   ketorolac (ACULAR) 0.5 % ophthalmic solution    lisinopril (ZESTRIL) 20 MG tablet Take 2 tablets (40 mg total) by mouth daily.   meloxicam (MOBIC) 7.5 MG tablet Take 1 tablet by mouth once daily   metFORMIN (GLUCOPHAGE) 1000 MG tablet Take 1 tablet (1,000 mg total) by mouth 2 (two) times daily.   TRADJENTA 5 MG TABS tablet Take 1 tablet by mouth once daily   No facility-administered encounter medications on file as of 06/14/2022.    Allergies (verified) Mevacor [lovastatin]   History: Past Medical History:  Diagnosis Date   Arthritis    Left knee   Diabetes mellitus without complication    GERD (gastroesophageal reflux disease)    H/O multiple trauma    Hyperlipidemia    Hypertension    Low back pain    Lung collapse    Shoulder pain  left   Thyroid disease    Wears partial dentures    Past Surgical History:  Procedure Laterality Date   BLADDER SURGERY     CATARACT EXTRACTION, BILATERAL     CESAREAN SECTION     COLONOSCOPY WITH PROPOFOL N/A 04/20/2017   Procedure: COLONOSCOPY WITH PROPOFOL;  Surgeon: Wyline MoodAnna, Kiran, MD;  [facility];  Service: Gastroenterology;  Laterality: N/A;   COLONOSCOPY WITH PROPOFOL N/A 08/30/2020   Procedure: COLONOSCOPY WITH PROPOFOL;  Surgeon: Midge MiniumWohl, Darren, MD;  [facility];  Service: Endoscopy;  Laterality: N/A;   ESOPHAGOGASTRODUODENOSCOPY (EGD) WITH PROPOFOL N/A 10/03/2021   Procedure: ESOPHAGOGASTRODUODENOSCOPY (EGD) WITH PROPOFOL;  Surgeon: Wyline MoodAnna, Kiran, MD;   [facility];  Service: Gastroenterology;  Laterality: N/A;   POLYPECTOMY N/A 08/30/2020   Procedure: POLYPECTOMY;  Surgeon: Midge MiniumWohl, Darren, MD;  [facility];  Service: Endoscopy;  Laterality: N/A;   THYROID SURGERY     TOOTH EXTRACTION  03/06/2014   Family History  Problem Relation Age of Onset   Heart attack Mother    Stroke Mother    Diabetes Mother    Aneurysm Father    Breast cancer Sister    Diabetes Sister    Ovarian cancer Neg Hx    Colon cancer Neg Hx    Social History   Socioeconomic History   Marital status: Single    Spouse name: Not on file   Number of children: Not on file   Years of education: Not on file   Highest education level: Not on file  Occupational History   Not on file  Tobacco Use   Smoking status: Former    Types: Cigarettes   Smokeless tobacco: Never  Vaping Use   Vaping Use: Never used  Substance and Sexual Activity   Alcohol use: No   Drug use: No   Sexual activity: Not Currently    Birth control/protection: Post-menopausal  Other Topics Concern   Not on file  Social History Narrative   Not on file   Social Determinants of Health   Financial Resource Strain: Low Risk  (06/14/2022)   Overall Financial Resource Strain (CARDIA)    Difficulty of Paying Living Expenses: Not hard at all  Food Insecurity: No Food Insecurity (06/14/2022)   Hunger Vital Sign    Worried About Running Out of Food in the Last Year: Never true    Ran Out of Food in the Last Year: Never true  Transportation Needs: No Transportation Needs (06/14/2022)   PRAPARE - Administrator, Civil ServiceTransportation    Lack of Transportation (Medical): No    Lack of Transportation (Non-Medical): No  Physical Activity: Inactive (06/14/2022)   Exercise Vital Sign    Days of Exercise per Week: 0 days    Minutes of Exercise per Session: 0 min  Stress: No Stress Concern Present (06/14/2022)   [employer] - Occupational Stress Questionnaire     Feeling of Stress : Not at all  Social Connections: Moderately Integrated (06/14/2022)   Social Connection and Isolation Panel [NHANES]    Frequency of Communication with Friends and Family: More than three times a week    Frequency of Social Gatherings with Friends and Family: More than three times a week    Attends Religious Services: More than 4 times per year    Active Member of [employer] or Organizations: Yes    Attends Banker[employer] or Organization Meetings: 1 to 4 times per year    Marital Status: Never married  Tobacco Counseling Counseling given: Not Answered   Clinical Intake:  Pre-visit preparation completed: Yes  Pain : No/denies pain   BMI - recorded: 41.15 Nutritional Status: BMI > 30  Obese Nutritional Risks: None Diabetes: Yes CBG done?: No Did pt. bring in CBG monitor from home?: No  How often do you need to have someone help you when you read instructions, pamphlets, or other written materials from your doctor or pharmacy?: 1 - Never  Diabetic?yes  Interpreter Needed?: No  Comments: daughter lives with pt Information entered by :: B.Tzippy Testerman,LPN   Activities of Daily Living    06/14/2022   11:23 AM 03/29/2022    9:48 AM  In your present state of health, do you have any difficulty performing the following activities:  Hearing? 0 1  Vision? 0 0  Difficulty concentrating or making decisions? 1 0  Comment forget sometimes   Walking or climbing stairs? 0 1  Dressing or bathing? 0 0  Doing errands, shopping? 0 0  Preparing Food and eating ? N   Using the Toilet? N   In the past six months, have you accidently leaked urine? Y   Do you have problems with loss of bowel control? N   Managing your Medications? N   Managing your Finances? N   Housekeeping or managing your Housekeeping? N     Patient Care Team: Jacky Kindle, FNP as PCP - General (Family Medicine)  Indicate any recent Medical Services you may have received from other than [facility] providers in the  past year (date may be approximate).     Assessment:   This is a routine wellness examination for [place].  Hearing/Vision screen Hearing Screening - Comments:: Adequate hearing Vision Screening - Comments:: Adequate vision after cataract surgery last year Dr Larence Penning   Dietary issues and exercise activities discussed: Current Exercise Habits: The patient does not participate in regular exercise at present, Exercise limited by: neurologic condition(s)   Goals Addressed             This Visit's Progress    DIET - EAT MORE FRUITS AND VEGETABLES   On track      Depression Screen    06/14/2022   11:13 AM 03/29/2022    9:48 AM 09/27/2021   11:04 AM 06/29/2021   10:03 AM 06/06/2021    2:09 PM 08/12/2020   10:39 AM 05/22/2019    4:48 PM  PHQ 2/9 Scores  PHQ - 2 Score 0 0 0 0 0 0 0  PHQ- 9 Score  0 0 0  0     Fall Risk    06/14/2022   11:11 AM 03/29/2022    9:48 AM 06/06/2021    2:11 PM 08/12/2020   10:39 AM 05/22/2019    4:49 PM  Fall Risk   Falls in the past year? 0 0 0 0 0  Number falls in past yr: 0 0 0 0   Injury with Fall? 0 0 0 0   Risk for fall due to : No Fall Risks  No Fall Risks No Fall Risks   Follow up Education provided;Falls prevention discussed  Falls evaluation completed Falls evaluation completed     FALL RISK PREVENTION PERTAINING TO THE HOME:  Any stairs in or around the home? No  If so, are there any without handrails? No  Home free of loose throw rugs in walkways, pet beds, electrical cords, etc? Yes  Adequate lighting in your home to reduce risk of falls? Yes  ASSISTIVE DEVICES UTILIZED TO PREVENT FALLS:  Life alert? no Use of a cane, walker or w/c? Yes cane as needed Grab bars in the bathroom? No  Shower chair or bench in shower? Yes  Elevated toilet seat or a handicapped toilet? Yes    Cognitive Function:        06/14/2022   11:31 AM  6CIT Screen  What Year? 0 points  What month? 0 points  What time? 0 points  Count back from 20 0 points   Months in reverse 0 points  Repeat phrase 0 points  Total Score 0 points   Immunizations Immunization History  Administered Date(s) Administered   Fluad Quad(high Dose 65+) 12/16/2020   Influenza, Seasonal, Injecte, Preservative Fre 12/07/2021   Influenza,inj,Quad PF,6+ Mos 12/19/2018   Moderna Sars-Covid-2 Vaccination 05/29/2019, 06/26/2019, 02/11/2020   PNEUMOCOCCAL CONJUGATE-20 06/29/2021   Pneumococcal Polysaccharide-23 12/17/2019   Tdap 09/18/2018    TDAP status: Due, Education has been provided regarding the importance of this vaccine. Advised may receive this vaccine at local pharmacy or Health Dept. Aware to provide a copy of the vaccination record if obtained from local pharmacy or Health Dept. Verbalized acceptance and understanding.  Flu Vaccine status: Up to date  Pneumococcal vaccine status: Up to date  Covid-19 vaccine status: Declined, Education has been provided regarding the importance of this vaccine but patient still declined. Advised may receive this vaccine at local pharmacy or Health Dept.or vaccine clinic. Aware to provide a copy of the vaccination record if obtained from local pharmacy or Health Dept. Verbalized acceptance and understanding.  Qualifies for Shingles Vaccine? Yes   Zostavax completed No   Shingrix Completed?: No.    Education has been provided regarding the importance of this vaccine. Patient has been advised to call insurance company to determine out of pocket expense if they have not yet received this vaccine. Advised may also receive vaccine at local pharmacy or Health Dept. Verbalized acceptance and understanding.  Screening Tests Health Maintenance  Topic Date Due   Zoster Vaccines- Shingrix (1 of 2) Never done   COVID-19 Vaccine (4 - 2023-24 season) 11/04/2021   FOOT EXAM  04/06/2022   Diabetic kidney evaluation - eGFR measurement  06/30/2022   HEMOGLOBIN A1C  09/27/2022   INFLUENZA VACCINE  10/05/2022   OPHTHALMOLOGY EXAM   10/07/2022   MAMMOGRAM  02/25/2023   Diabetic kidney evaluation - Urine ACR  03/30/2023   Medicare Annual Wellness (AWV)  06/14/2023   COLONOSCOPY (Pts 45-68yrs Insurance coverage will need to be confirmed)  08/31/2027   DTaP/Tdap/Td (2 - Td or Tdap) 09/17/2028   Pneumonia Vaccine 50+ Years old  Completed   DEXA SCAN  Completed   Hepatitis C Screening  Completed   HPV VACCINES  Aged Out    Health Maintenance  Health Maintenance Due  Topic Date Due   Zoster Vaccines- Shingrix (1 of 2) Never done   COVID-19 Vaccine (4 - 2023-24 season) 11/04/2021   FOOT EXAM  04/06/2022   Diabetic kidney evaluation - eGFR measurement  06/30/2022    Colorectal cancer screening: Type of screening: Colonoscopy. Completed yes. Repeat every 10 years  Mammogram status: Completed yes. Repeat every year  Bone Density: DONE/normal/ Q69yr  Lung Cancer Screening: (Low Dose CT Chest recommended if Age 26-80 years, 30 pack-year currently smoking OR have quit w/in 15years.) does not qualify.   Lung Cancer Screening Referral: no  Additional Screening:  Hepatitis C Screening: does not qualify; Completed yes  Vision Screening: Recommended annual  ophthalmology exams for early detection of glaucoma and other disorders of the eye. Is the patient up to date with their annual eye exam?  Yes  Who is the provider or what is the name of the office in which the patient attends annual eye exams? Dr Larence Penning If pt is not established with a provider, would they like to be referred to a provider to establish care? No .   Dental Screening: Recommended annual dental exams for proper oral hygiene  Community Resource Referral / Chronic Care Management: CRR required this visit?  No   CCM required this visit?  No    Plan:     I have personally reviewed and noted the following in the patient's chart:   Medical and social history Use of alcohol, tobacco or illicit drugs  Current medications and supplements including opioid  prescriptions. Patient is not currently taking opioid prescriptions. Functional ability and status Nutritional status Physical activity Advanced directives List of other physicians Hospitalizations, surgeries, and ER visits in previous 12 months Vitals Screenings to include cognitive, depression, and falls Referrals and appointments  In addition, I have reviewed and discussed with patient certain preventive protocols, quality metrics, and best practice recommendations. A written personalized care plan for preventive services as well as general preventive health recommendations were provided to patient.    Sue Lush, LPN   1/61/0960   Nurse Notes: pt relays she is doing well. She does inquire about discussion on bladder control medications and/or measures; wants to discuss at visit this month. Pt also indicates co-pay for Januvia and pravastatin still too high. I encouraged pt to go to manufacturer website for coupons or have pharmacy apply GoodRx to see if will lower cost. Pt relays she will do. Pt has no questions or concerns otherwise.

## 2022-06-14 NOTE — Patient Instructions (Signed)
Chelsea Gomez , Thank you for taking time to come for your Medicare Wellness Visit. I appreciate your ongoing commitment to your health goals. Please review the following plan we discussed and let me know if I can assist you in the future.   These are the goals we discussed:  Goals       DIET - EAT MORE FRUITS AND VEGETABLES      My copay is high for Januvia and pravastatin (pt-stated)      Current Barriers:  High copays on pravastatin and Januvia  "stretching out" these medications to make them last longer (taking Januvia every 3 days)  Pharmacist Clinical Goal(s):  Over the next 7 days, Chelsea Gomez will successfully activate Januvia coupon and inquire about pravastatin cash price at her pharmacy, as evidenced by patient report.   Interventions: Provided coupon for Januvia and instructions on how to activate Provided information about [facility]'s cash price list Will consider alternative to pravastatin with a lower copay on patient's insurance or cash price if necessary  Patient Self Care Activities:  Self administers medications as prescribed  Initial goal documentation         This is a list of the screening recommended for you and due dates:  Health Maintenance  Topic Date Due   Zoster (Shingles) Vaccine (1 of 2) Never done   COVID-19 Vaccine (4 - 2023-24 season) 11/04/2021   Complete foot exam   04/06/2022   Yearly kidney function blood test for diabetes  06/30/2022   Hemoglobin A1C  09/27/2022   Flu Shot  10/05/2022   Eye exam for diabetics  10/07/2022   Mammogram  02/25/2023   Yearly kidney health urinalysis for diabetes  03/30/2023   Medicare Annual Wellness Visit  06/14/2023   Colon Cancer Screening  08/31/2027   DTaP/Tdap/Td vaccine (2 - Td or Tdap) 09/17/2028   Pneumonia Vaccine  Completed   DEXA scan (bone density measurement)  Completed   Hepatitis C Screening: USPSTF Recommendation to screen - Ages 11-79 yo.  Completed   HPV Vaccine  Aged Out    Advanced  directives: [id number] paperwork  Conditions/risks identified: none  Next appointment: Follow up in one year for your annual wellness visit 06/19/2023 @11 :15am telephone   Preventive Care 65 Years and Older, Female Preventive care refers to lifestyle choices and visits with your health care provider that can promote health and wellness. What does preventive care include? A yearly physical exam. This is also called an annual well check. Dental exams once or twice a year. Routine eye exams. Ask your health care provider how often you should have your eyes checked. Personal lifestyle choices, including: Daily care of your teeth and gums. Regular physical activity. Eating a healthy diet. Avoiding tobacco and drug use. Limiting alcohol use. Practicing safe sex. Taking low-dose aspirin every day. Taking vitamin and mineral supplements as recommended by your health care provider. What happens during an annual well check? The services and screenings done by your health care provider during your annual well check will depend on your age, overall health, lifestyle risk factors, and family history of disease. Counseling  Your health care provider may ask you questions about your: Alcohol use. Tobacco use. Drug use. Emotional well-being. Home and relationship well-being. Sexual activity. Eating habits. History of falls. Memory and ability to understand (cognition). Work and work Astronomer. Reproductive health. Screening  You may have the following tests or measurements: Height, weight, and BMI. Blood pressure. Lipid and cholesterol levels. These may be  checked every 5 years, or more frequently if you are over 68 years old. Skin check. Lung cancer screening. You may have this screening every year starting at age 68 if you have a 30-pack-year history of smoking and currently smoke or have quit within the past 15 years. Fecal occult blood test (FOBT) of the stool. You may have this test  every year starting at age 68. Flexible sigmoidoscopy or colonoscopy. You may have a sigmoidoscopy every 5 years or a colonoscopy every 10 years starting at age 68. Hepatitis C blood test. Hepatitis B blood test. Sexually transmitted disease (STD) testing. Diabetes screening. This is done by checking your blood sugar (glucose) after you have not eaten for a while (fasting). You may have this done every 1-3 years. Bone density scan. This is done to screen for osteoporosis. You may have this done starting at age 68. Mammogram. This may be done every 1-2 years. Talk to your health care provider about how often you should have regular mammograms. Talk with your health care provider about your test results, treatment options, and if necessary, the need for more tests. Vaccines  Your health care provider may recommend certain vaccines, such as: Influenza vaccine. This is recommended every year. Tetanus, diphtheria, and acellular pertussis (Tdap, Td) vaccine. You may need a Td booster every 10 years. Zoster vaccine. You may need this after age 68. Pneumococcal 13-valent conjugate (PCV13) vaccine. One dose is recommended after age 68. Pneumococcal polysaccharide (PPSV23) vaccine. One dose is recommended after age 68. Talk to your health care provider about which screenings and vaccines you need and how often you need them. This information is not intended to replace advice given to you by your health care provider. Make sure you discuss any questions you have with your health care provider. Document Released: 03/19/2015 Document Revised: 11/10/2015 Document Reviewed: 12/22/2014 Elsevier Interactive Patient Education  2017 [employer]  Fall Prevention in the Home Falls can cause injuries. They can happen to people of all ages. There are many things you can do to make your home safe and to help prevent falls. What can I do on the outside of my home? Regularly fix the edges of walkways and driveways  and fix any cracks. Remove anything that might make you trip as you walk through a door, such as a raised step or threshold. Trim any bushes or trees on the path to your home. Use bright outdoor lighting. Clear any walking paths of anything that might make someone trip, such as rocks or tools. Regularly check to see if handrails are loose or broken. Make sure that both sides of any steps have handrails. Any raised decks and porches should have guardrails on the edges. Have any leaves, snow, or ice cleared regularly. Use sand or salt on walking paths during winter. Clean up any spills in your garage right away. This includes oil or grease spills. What can I do in the bathroom? Use night lights. Install grab bars by the toilet and in the tub and shower. Do not use towel bars as grab bars. Use non-skid mats or decals in the tub or shower. If you need to sit down in the shower, use a plastic, non-slip stool. Keep the floor dry. Clean up any water that spills on the floor as soon as it happens. Remove soap buildup in the tub or shower regularly. Attach bath mats securely with double-sided non-slip rug tape. Do not have throw rugs and other things on the floor that  can make you trip. What can I do in the bedroom? Use night lights. Make sure that you have a light by your bed that is easy to reach. Do not use any sheets or blankets that are too big for your bed. They should not hang down onto the floor. Have a firm chair that has side arms. You can use this for support while you get dressed. Do not have throw rugs and other things on the floor that can make you trip. What can I do in the kitchen? Clean up any spills right away. Avoid walking on wet floors. Keep items that you use a lot in easy-to-reach places. If you need to reach something above you, use a strong step stool that has a grab bar. Keep electrical cords out of the way. Do not use floor polish or wax that makes floors slippery. If  you must use wax, use non-skid floor wax. Do not have throw rugs and other things on the floor that can make you trip. What can I do with my stairs? Do not leave any items on the stairs. Make sure that there are handrails on both sides of the stairs and use them. Fix handrails that are broken or loose. Make sure that handrails are as long as the stairways. Check any carpeting to make sure that it is firmly attached to the stairs. Fix any carpet that is loose or worn. Avoid having throw rugs at the top or bottom of the stairs. If you do have throw rugs, attach them to the floor with carpet tape. Make sure that you have a light switch at the top of the stairs and the bottom of the stairs. If you do not have them, ask someone to add them for you. What else can I do to help prevent falls? Wear shoes that: Do not have high heels. Have rubber bottoms. Are comfortable and fit you well. Are closed at the toe. Do not wear sandals. If you use a stepladder: Make sure that it is fully opened. Do not climb a closed stepladder. Make sure that both sides of the stepladder are locked into place. Ask someone to hold it for you, if possible. Clearly mark and make sure that you can see: Any grab bars or handrails. First and last steps. Where the edge of each step is. Use tools that help you move around (mobility aids) if they are needed. These include: Canes. Walkers. Scooters. Crutches. Turn on the lights when you go into a dark area. Replace any light bulbs as soon as they burn out. Set up your furniture so you have a clear path. Avoid moving your furniture around. If any of your floors are uneven, fix them. If there are any pets around you, be aware of where they are. Review your medicines with your doctor. Some medicines can make you feel dizzy. This can increase your chance of falling. Ask your doctor what other things that you can do to help prevent falls. This information is not intended to  replace advice given to you by your health care provider. Make sure you discuss any questions you have with your health care provider. Document Released: 12/17/2008 Document Revised: 07/29/2015 Document Reviewed: 03/27/2014 Elsevier Interactive Patient Education  2017 [employer].

## 2022-06-27 NOTE — Progress Notes (Unsigned)
I,J'ya E Scarlett Portlock,acting as a scribe for Jacky Kindle, FNP.,have documented all relevant documentation on the behalf of Jacky Kindle, FNP,as directed by  Jacky Kindle, FNP while in the presence of Jacky Kindle, FNP.  Established patient visit  Patient: Chelsea Gomez   DOB: Oct 14, 1955   67 y.o. Female  MRN: [id number] Visit Date: 06/28/2022  Today's healthcare provider: Jacky Kindle, FNP  Re Introduced to nurse practitioner role and practice setting.  All questions answered.  Discussed provider/patient relationship and expectations.  Chief Complaint  Patient presents with   Chronic Disease Mangement   Subjective    HPI  Hypertension, follow-up  BP Readings from Last 3 Encounters:  06/28/22 123/77  03/29/22 124/64  12/28/21 105/68   Wt Readings from Last 3 Encounters:  06/28/22 223 lb 11.4 oz (101.5 kg)  06/14/22 225 lb (102.1 kg)  03/29/22 225 lb (102.1 kg)     She was last seen for hypertension 3 months ago.  BP at that visit was 124/64. Management since that visit includes Continue Coreg 6.25 mg BID with 20 mg Lisinopril.   She reports excellent compliance with treatment. She is not having side effects.  She is following a Regular diet. She is not exercising. She does not smoke.   Symptoms: No chest pain No chest pressure  No palpitations No syncope  No dyspnea No orthopnea  No paroxysmal nocturnal dyspnea Yes lower extremity edema   Pertinent labs Lab Results  Component Value Date   CHOL 142 06/29/2021   HDL 39 (L) 06/29/2021   LDLCALC 81 06/29/2021   TRIG 123 06/29/2021   CHOLHDL 3.6 06/29/2021   Lab Results  Component Value Date   NA 141 06/29/2021   K 4.4 06/29/2021   CREATININE 0.55 (L) 06/29/2021   EGFR 101 06/29/2021   GLUCOSE 271 (H) 06/29/2021   TSH 1.310 06/29/2021     The 10-year ASCVD risk score (Arnett DK, et al., 2019) is:  16.3%  --------------------------------------------------------------------------------------------------- Diabetes Mellitus Type II, Follow-up  Lab Results  Component Value Date   HGBA1C 8.3 (A) 03/29/2022   HGBA1C 7.6 (H) 12/28/2021   HGBA1C 8.1 (A) 09/27/2021   Wt Readings from Last 3 Encounters:  06/28/22 223 lb 11.4 oz (101.5 kg)  06/14/22 225 lb (102.1 kg)  03/29/22 225 lb (102.1 kg)   Last seen for diabetes 3 months ago.  Management since then includes continue statin 20 mg, ace- lisinopri 20 mg, Jardiance 25, glipizide 10, tradjenta 5, metformin 1000 mg. She reports excellent compliance with treatment. She is having side effects.  Symptoms: No fatigue No foot ulcerations  No appetite changes No nausea  Yes paresthesia of the feet  No polydipsia  Yes polyuria No visual disturbances   No vomiting    Episodes of hypoglycemia? Yes, patient reports that she was running around and did not eat anything in March.    Most Recent Eye Exam: February 2024 Current exercise: none Current diet habits: well balanced  Pertinent Labs: Lab Results  Component Value Date   CHOL 142 06/29/2021   HDL 39 (L) 06/29/2021   LDLCALC 81 06/29/2021   TRIG 123 06/29/2021   CHOLHDL 3.6 06/29/2021   Lab Results  Component Value Date   NA 141 06/29/2021   K 4.4 06/29/2021   CREATININE 0.55 (L) 06/29/2021   EGFR 101 06/29/2021   LABMICR 9.6 03/29/2022   MICRALBCREAT 16 03/29/2022     Patient has concerns of neuropathy in her  right foot complains of a shooting pain and loss of sensation. She reports that the pain comes and goes. ---------------------------------------------------------------------------------------------------   Medications: Outpatient Medications Prior to Visit  Medication Sig   acetaminophen (TYLENOL) 500 MG tablet Take 500 mg by mouth every 6 (six) hours as needed.   aspirin 81 MG tablet Take 81 mg by mouth daily.   atorvastatin (LIPITOR) 20 MG tablet Take 1 tablet  (20 mg total) by mouth daily.   CALCIUM-VITAMIN D PO Take 800 mg by mouth daily.   carboxymethylcellulose (REFRESH PLUS) 0.5 % SOLN 1 drop 3 (three) times daily as needed.   carvedilol (COREG) 6.25 MG tablet Take 1 tablet by mouth twice daily   Charcoal Activated (ACTIVATED CHARCOAL PO) Take 1,040 mg by mouth daily.   empagliflozin (JARDIANCE) 25 MG TABS tablet Take 1 tablet (25 mg total) by mouth daily before breakfast.   fexofenadine (ALLEGRA) 180 MG tablet Take 180 mg by mouth daily.   glipiZIDE (GLUCOTROL) 10 MG tablet TAKE 1 TABLET BY MOUTH TWICE DAILY 30 MINUTES BEFORE A MEAL   glucose blood (ONETOUCH VERIO) test strip Use as instructed   glucose blood (ONETOUCH VERIO) test strip Use as instructed   ketorolac (ACULAR) 0.5 % ophthalmic solution    lisinopril (ZESTRIL) 20 MG tablet Take 2 tablets (40 mg total) by mouth daily.   meloxicam (MOBIC) 7.5 MG tablet Take 1 tablet by mouth once daily   metFORMIN (GLUCOPHAGE) 1000 MG tablet Take 1 tablet (1,000 mg total) by mouth 2 (two) times daily.   TRADJENTA 5 MG TABS tablet Take 1 tablet by mouth once daily   No facility-administered medications prior to visit.    Review of Systems    Objective    BP 123/77 (BP Location: Left Arm, Patient Position: Sitting, Cuff Size: Large)   Pulse 86   Temp 98.2 F (36.8 C) (Oral)   Resp 13   Ht  (1.575 m)   Wt 223 lb 11.4 oz (101.5 kg)   SpO2 99%   BMI 40.92 kg/m   Physical Exam Vitals and nursing note reviewed.  Constitutional:      General: She is not in acute distress.    Appearance: Normal appearance. She is obese. She is not ill-appearing, toxic-appearing or diaphoretic.  HENT:     Head: Normocephalic and atraumatic.  Cardiovascular:     Rate and Rhythm: Normal rate and regular rhythm.     Pulses: Normal pulses.     Heart sounds: Normal heart sounds. No murmur heard.    No friction rub. No gallop.  Pulmonary:     Effort: Pulmonary effort is normal. No respiratory distress.      Breath sounds: Normal breath sounds. No stridor. No wheezing, rhonchi or rales.  Chest:     Chest wall: No tenderness.  Abdominal:     General: Bowel sounds are normal.     Palpations: Abdomen is soft.  Musculoskeletal:        General: No swelling, tenderness, deformity or signs of injury. Normal range of motion.     Right lower leg: No edema.     Left lower leg: No edema.  Skin:    General: Skin is warm and dry.     Capillary Refill: Capillary refill takes less than 2 seconds.     Coloration: Skin is not jaundiced or pale.     Findings: No bruising, erythema, lesion or rash.  Neurological:     General: No focal deficit present.  Mental Status: She is alert and oriented to person, place, and time. Mental status is at baseline.     Cranial Nerves: No cranial nerve deficit.     Sensory: No sensory deficit.     Motor: No weakness.     Coordination: Coordination normal.  Psychiatric:        Mood and Affect: Mood normal.        Behavior: Behavior normal.        Thought Content: Thought content normal.        Judgment: Judgment normal.     No results found for any visits on 06/28/22.  Assessment & Plan     Problem List Items Addressed This Visit       Cardiovascular and Mediastinum   Hypertension associated with diabetes    Chronic, borderline Goal 120/80 or less Continues on ASA 81 mg, Coreg 6.25 mg BID, Jardiance 25, Glipizide 10 mg BID, Lisinopril 40 mg [2-20mg ], Metformin 1000 BID, Trajenta 5 mg        Relevant Orders   CBC with Differential/Platelet   Comprehensive Metabolic Panel (CMET)   Hemoglobin A1c   Lipid panel     Endocrine   Hyperlipidemia associated with type 2 diabetes mellitus    Chronic, previously elevated LDL goal of <70 Chronic, borderline Goal 120/80 or less Continues on ASA 81 mg, Coreg 6.25 mg BID, Jardiance 25, Glipizide 10 mg BID, Lisinopril 40 mg [2-20mg ], Metformin 1000 BID, Trajenta 5 mg; Lipitor 20 mg         Relevant  Orders   CBC with Differential/Platelet   Comprehensive Metabolic Panel (CMET)   Hemoglobin A1c   Lipid panel   Type 2 diabetes mellitus with other specified complication - Primary    Chronic, A1c elevated Known non-prolif retinopathy; which then resolved Continue to recommend annual DM eye appts Continues on ASA 81 mg, Coreg 6.25 mg BID, Jardiance 25, Glipizide 10 mg BID, Lisinopril 40 mg [2-20mg ], Metformin 1000 BID, Trajenta 5 mg; Lipitor 20 mg  Goal A1c <7%      Relevant Orders   CBC with Differential/Platelet   Comprehensive Metabolic Panel (CMET)   Hemoglobin A1c   Lipid panel     Other   Morbid obesity    Chronic, without large weight gain or loss Body mass index is 40.92 kg/m. Discussed importance of healthy weight management Discussed diet and exercise       Relevant Orders   CBC with Differential/Platelet   Comprehensive Metabolic Panel (CMET)   Hemoglobin A1c   Lipid panel   Return in about 3 months (around 09/27/2022) for chonic disease management.     Leilani Merl, FNP, have reviewed all documentation for this visit. The documentation on 06/28/22 for the exam, diagnosis, procedures, and orders are all accurate and complete.  Jacky Kindle, FNP  [facility] [phone] (phone) [phone] (fax)  [facility]

## 2022-06-28 ENCOUNTER — Ambulatory Visit (INDEPENDENT_AMBULATORY_CARE_PROVIDER_SITE_OTHER): Payer: Medicare Other | Admitting: Family Medicine

## 2022-06-28 ENCOUNTER — Encounter: Payer: Self-pay | Admitting: Family Medicine

## 2022-06-28 VITALS — BP 123/77 | HR 86 | Temp 98.2°F | Resp 13 | Ht 62.0 in | Wt 223.7 lb

## 2022-06-28 DIAGNOSIS — E785 Hyperlipidemia, unspecified: Secondary | ICD-10-CM

## 2022-06-28 DIAGNOSIS — E1159 Type 2 diabetes mellitus with other circulatory complications: Secondary | ICD-10-CM

## 2022-06-28 DIAGNOSIS — E1169 Type 2 diabetes mellitus with other specified complication: Secondary | ICD-10-CM | POA: Diagnosis not present

## 2022-06-28 DIAGNOSIS — I152 Hypertension secondary to endocrine disorders: Secondary | ICD-10-CM

## 2022-06-28 IMAGING — US US PELVIS LIMITED
1 series · 5 of 5 positions shown · non-contrast
Comparison: CT December 16, 2011.

CLINICAL DATA: Gross hematuria.

EXAM:
LIMITED ULTRASOUND OF PELVIS
TECHNIQUE: Limited transabdominal ultrasound examination of the pelvis was
performed.

[Series 1: us pelvis limited · 0.25mm/px · 5 of 5 slices shown]
[im 1/5]
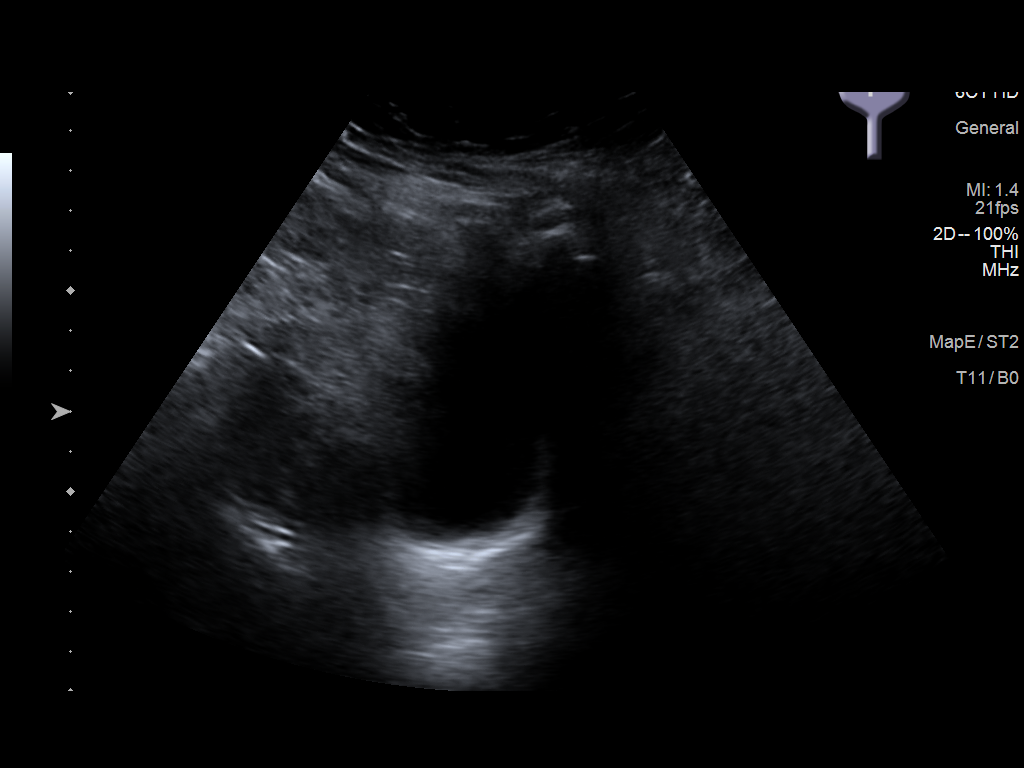
[im 2/5]
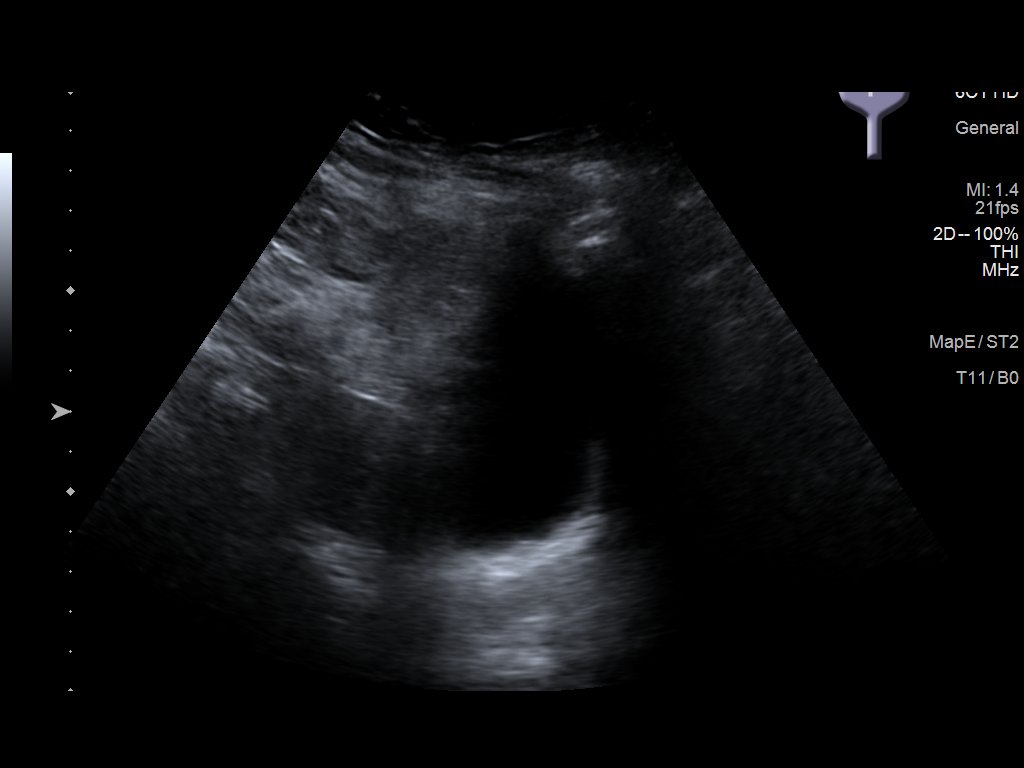
[im 3/5]
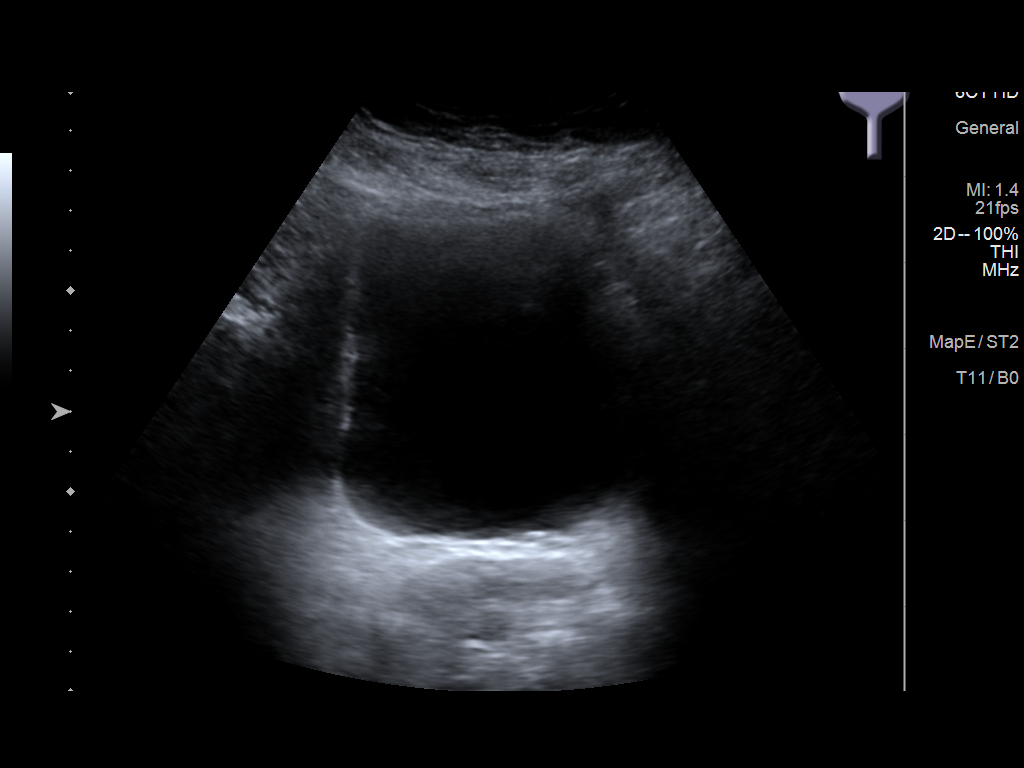
[im 4/5]
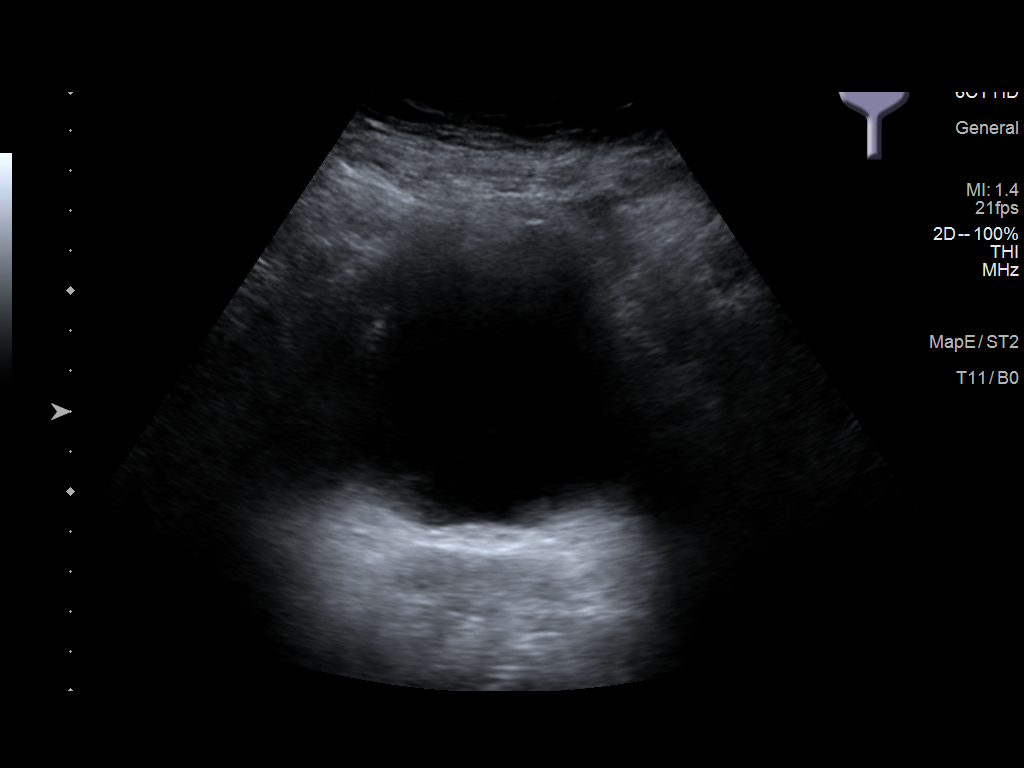
[im 5/5]
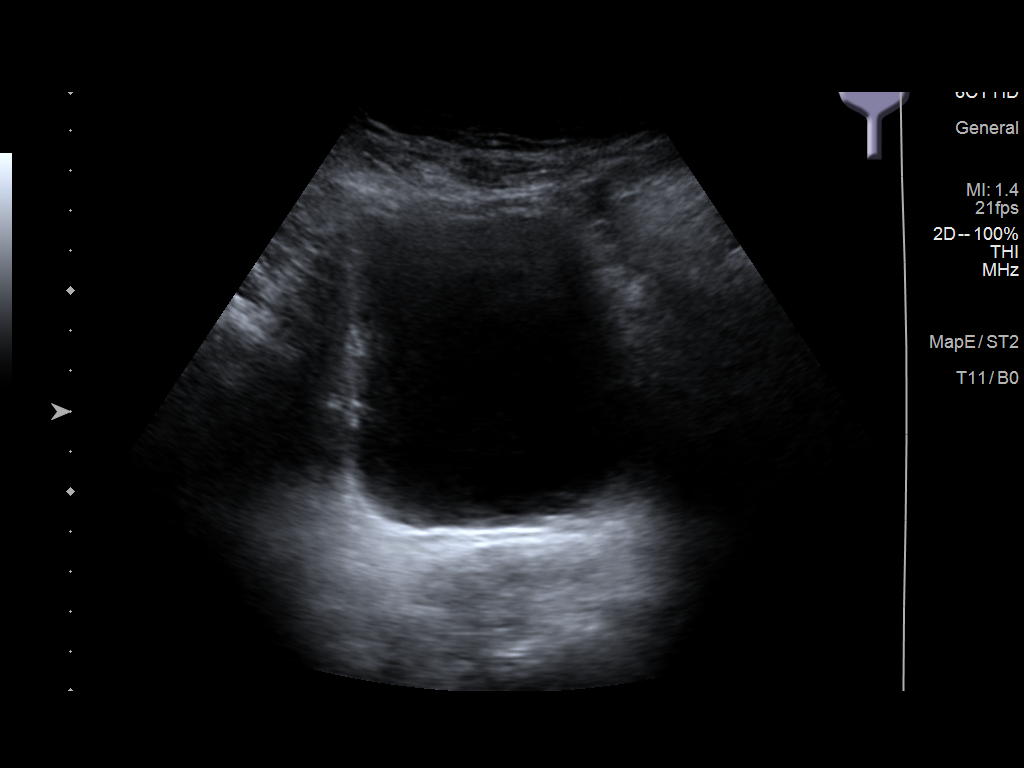

[5 of 5 positions shown; findings below may reference images not displayed]

FINDINGS: The urinary bladder has a normal appearance for the degree of
distention.

Other findings:  None.
IMPRESSION: Normal appearance of the urinary bladder.

## 2022-06-28 IMAGING — US US ABDOMEN COMPLETE
1 series · 14 of 25 positions shown · non-contrast
Comparison: CT December 16, 2011.

CLINICAL DATA: 2-3 weeks of abdominal pain

EXAM:
ABDOMEN ULTRASOUND COMPLETE

[Series 1: us abdomen complete · 0.22mm/px · 14 of 85 slices shown]
[im 1/85]
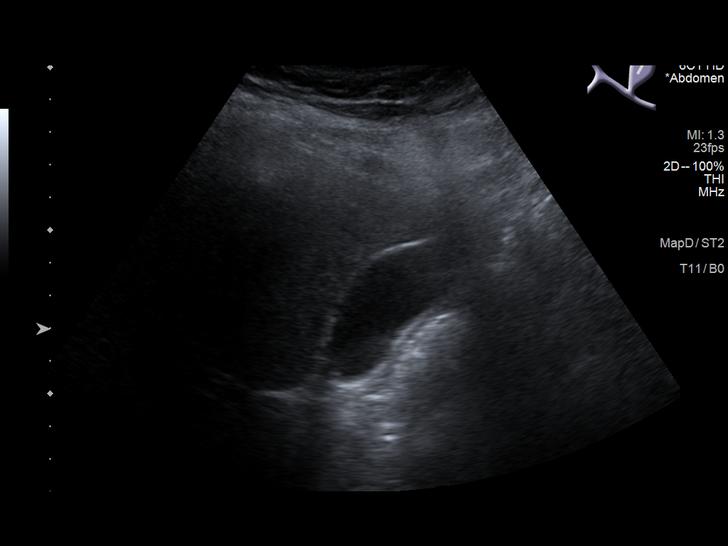
[im 8/85]
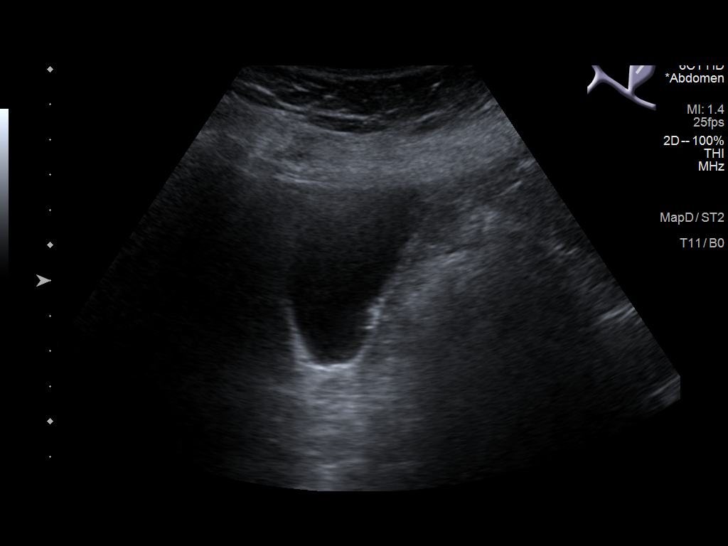
[im 15/85]
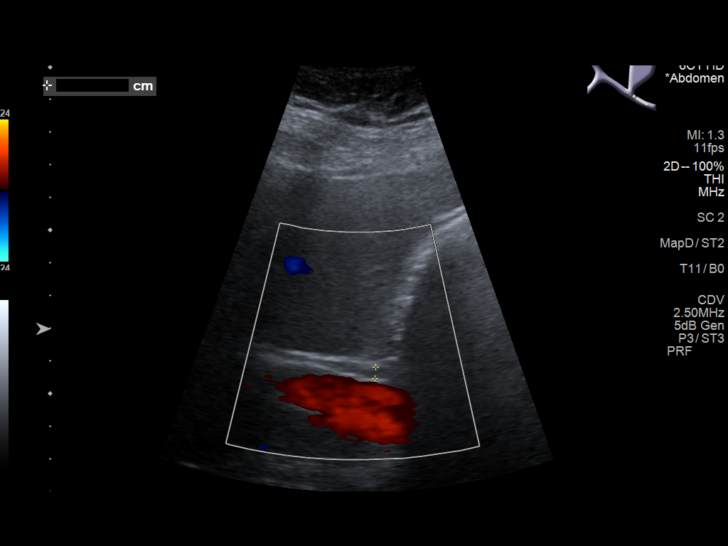
[im 22/85]
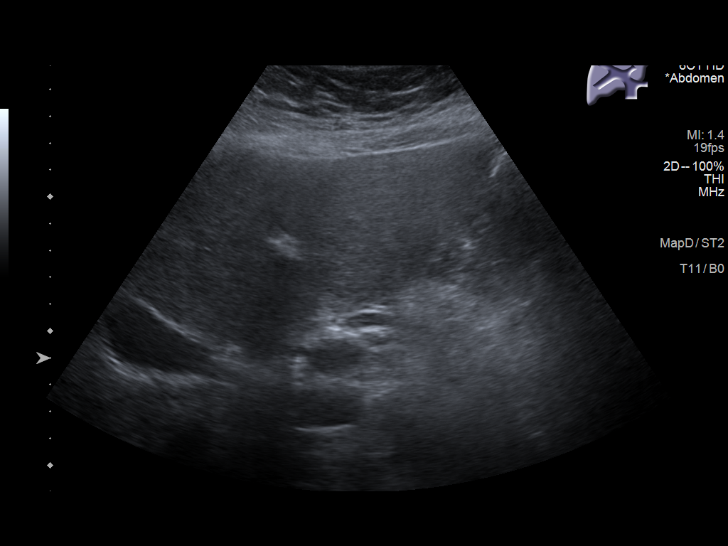
[im 29/85]
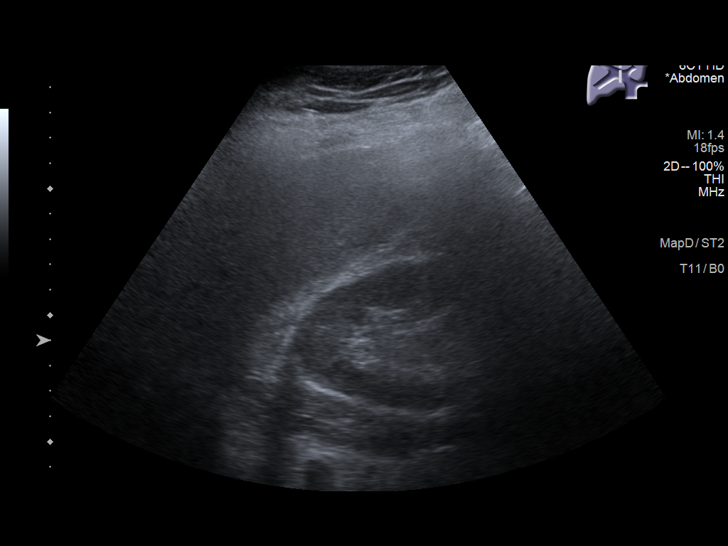
[im 32/85]
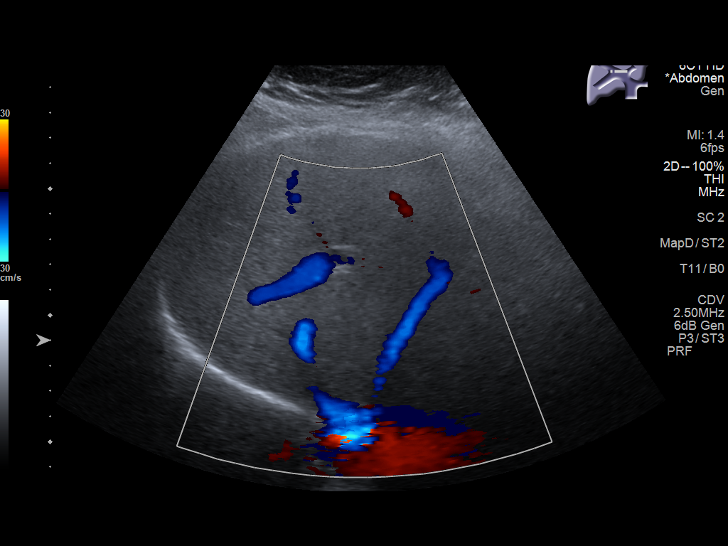
[im 39/85]
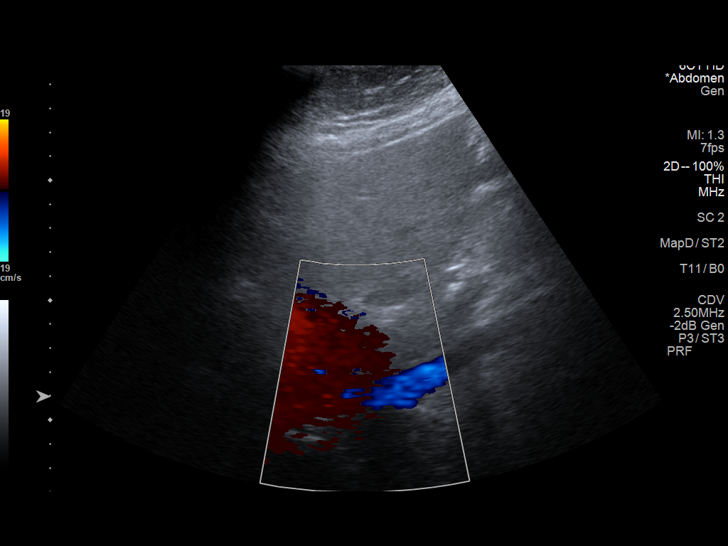
[im 46/85]
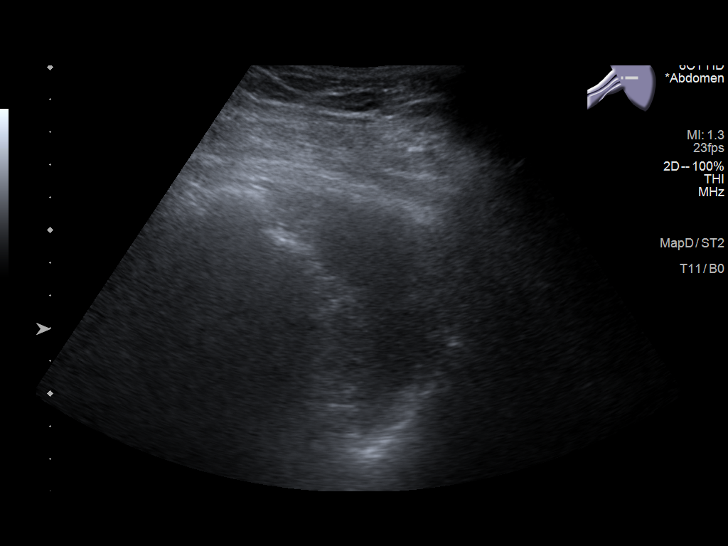
[im 53/85]
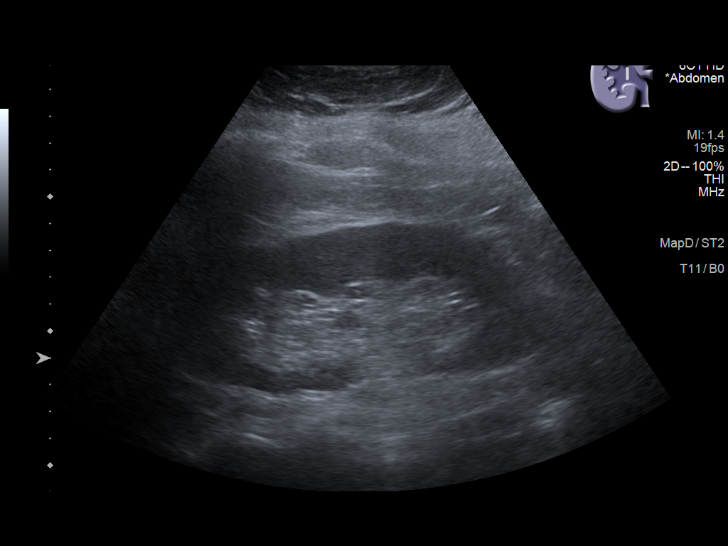
[im 57/85]
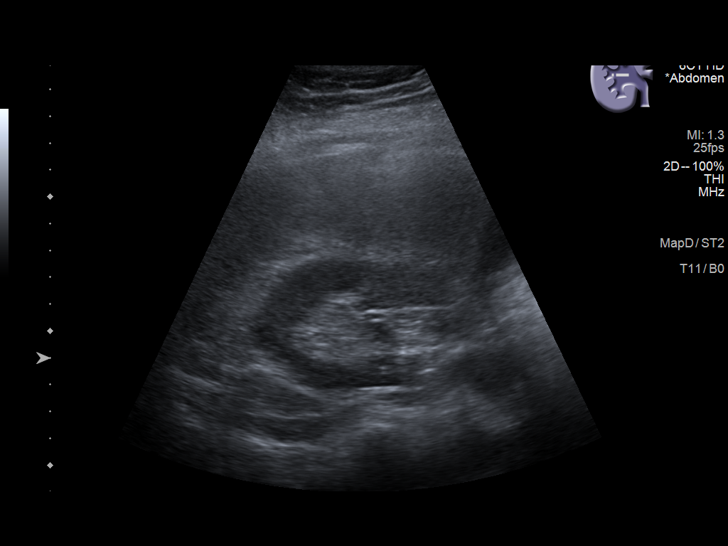
[im 64/85]
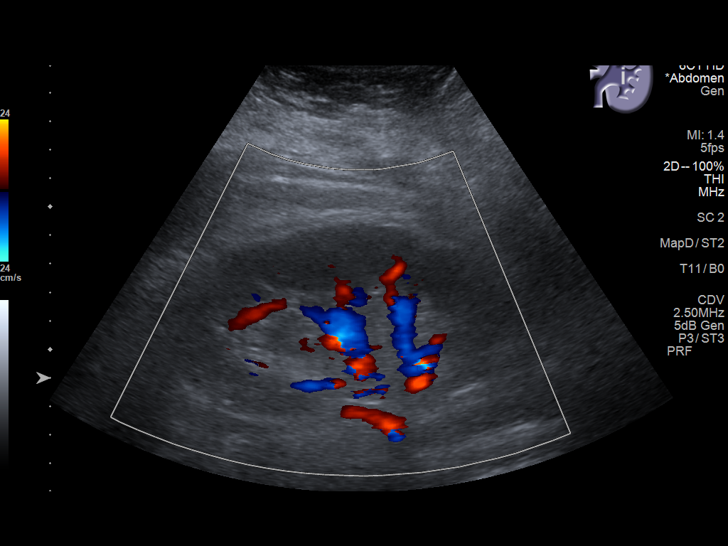
[im 71/85]
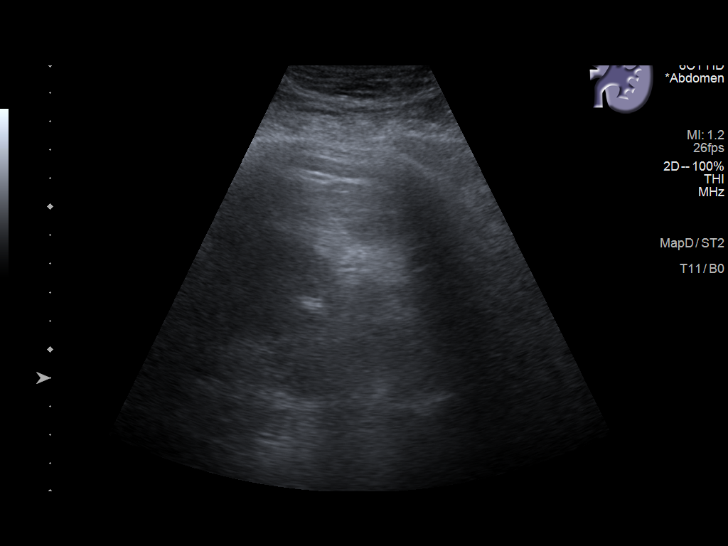
[im 78/85]
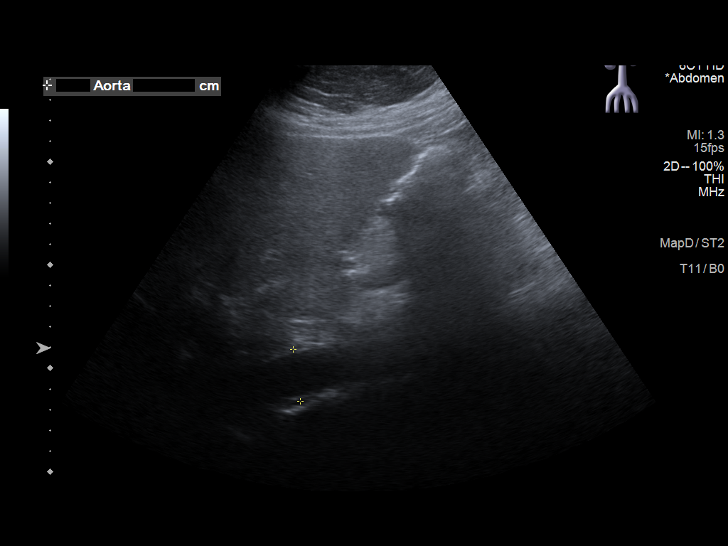
[im 85/85]
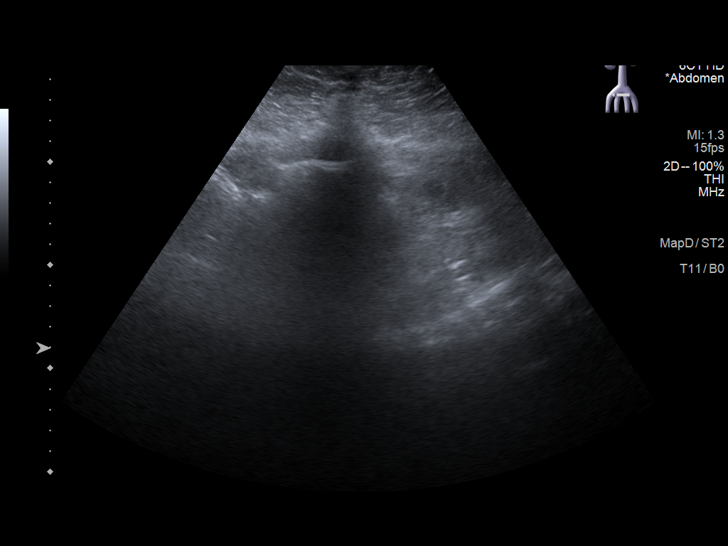

[14 of 25 positions shown; findings below may reference images not displayed]

FINDINGS: Gallbladder: No gallstones or wall thickening visualized. No
sonographic Murphy sign noted by sonographer.

Common bile duct: Diameter: 4 mm

Liver: No focal lesion identified. Within normal limits in
parenchymal echogenicity. Portal vein is patent on color Doppler
imaging with normal direction of blood flow towards the liver.

IVC: No abnormality visualized.

Pancreas: Visualized portion unremarkable.

Spleen: Size and appearance within normal limits.

Right Kidney: Length: 12.8 cm. Echogenicity within normal limits. No
mass or hydronephrosis visualized.

Left Kidney: Length: 13.1 cm. Echogenicity within normal limits.
Nonobstructive 8 mm renal stone. No mass or hydronephrosis
visualized.

Abdominal aorta: No aneurysm visualized.

Other findings: None.
IMPRESSION: 1. No acute abnormality.
2. Nonobstructive 8 mm left renal stone. No hydronephrosis.

## 2022-06-28 NOTE — Assessment & Plan Note (Signed)
Chronic, previously elevated LDL goal of <70 Chronic, borderline Goal 120/80 or less Continues on ASA 81 mg, Coreg 6.25 mg BID, Jardiance 25, Glipizide 10 mg BID, Lisinopril 40 mg [2-20mg ], Metformin 1000 BID, Trajenta 5 mg; Lipitor 20 mg

## 2022-06-28 NOTE — Assessment & Plan Note (Signed)
Chronic, borderline Goal 120/80 or less Continues on ASA 81 mg, Coreg 6.25 mg BID, Jardiance 25, Glipizide 10 mg BID, Lisinopril 40 mg [2-20mg ], Metformin 1000 BID, Trajenta 5 mg

## 2022-06-28 NOTE — Assessment & Plan Note (Signed)
Chronic, without large weight gain or loss Body mass index is 40.92 kg/m. Discussed importance of healthy weight management Discussed diet and exercise

## 2022-06-28 NOTE — Assessment & Plan Note (Signed)
Chronic, A1c elevated Known non-prolif retinopathy; which then resolved Continue to recommend annual DM eye appts Continues on ASA 81 mg, Coreg 6.25 mg BID, Jardiance 25, Glipizide 10 mg BID, Lisinopril 40 mg [2-20mg ], Metformin 1000 BID, Trajenta 5 mg; Lipitor 20 mg  Goal A1c <7%

## 2022-06-29 LAB — COMPREHENSIVE METABOLIC PANEL
ALT: 11 IU/L (ref 0–32)
AST: 13 IU/L (ref 0–40)
Albumin/Globulin Ratio: 1.5 (ref 1.2–2.2)
Albumin: 4.2 g/dL (ref 3.9–4.9)
Alkaline Phosphatase: 73 IU/L (ref 44–121)
BUN/Creatinine Ratio: 20 (ref 12–28)
BUN: 11 mg/dL (ref 8–27)
Bilirubin Total: 0.3 mg/dL (ref 0.0–1.2)
CO2: 21 mmol/L (ref 20–29)
Calcium: 9.7 mg/dL (ref 8.7–10.3)
Chloride: 102 mmol/L (ref 96–106)
Creatinine, Ser: 0.54 mg/dL — ABNORMAL LOW (ref 0.57–1.00)
Globulin, Total: 2.8 g/dL (ref 1.5–4.5)
Glucose: 89 mg/dL (ref 70–99)
Potassium: 4.3 mmol/L (ref 3.5–5.2)
Sodium: 140 mmol/L (ref 134–144)
Total Protein: 7 g/dL (ref 6.0–8.5)
eGFR: 101 mL/min/{1.73_m2} (ref >59)

## 2022-06-29 LAB — HEMOGLOBIN A1C
Est. average glucose Bld gHb Est-mCnc: 163 mg/dL
Hgb A1c MFr Bld: 7.3 % — ABNORMAL HIGH (ref 4.8–5.6)

## 2022-06-29 LAB — LIPID PANEL
Chol/HDL Ratio: 3.3 ratio (ref 0.0–4.4)
Cholesterol, Total: 130 mg/dL (ref 100–199)
HDL: 39 mg/dL — ABNORMAL LOW (ref >39)
LDL Chol Calc (NIH): 71 mg/dL (ref 0–99)
Triglycerides: 105 mg/dL (ref 0–149)
VLDL Cholesterol Cal: 20 mg/dL (ref 5–40)

## 2022-06-29 LAB — CBC WITH DIFFERENTIAL/PLATELET
Basophils Absolute: 0.1 10*3/uL (ref 0.0–0.2)
Basos: 1 %
EOS (ABSOLUTE): 0.1 10*3/uL (ref 0.0–0.4)
Eos: 2 %
Hematocrit: 39.5 % (ref 34.0–46.6)
Hemoglobin: 12.9 g/dL (ref 11.1–15.9)
Immature Grans (Abs): 0 10*3/uL (ref 0.0–0.1)
Immature Granulocytes: 1 %
Lymphocytes Absolute: 4 10*3/uL — ABNORMAL HIGH (ref 0.7–3.1)
Lymphs: 50 %
MCH: 28.8 pg (ref 26.6–33.0)
MCHC: 32.7 g/dL (ref 31.5–35.7)
MCV: 88 fL (ref 79–97)
Monocytes Absolute: 0.5 10*3/uL (ref 0.1–0.9)
Monocytes: 7 %
Neutrophils Absolute: 3 10*3/uL (ref 1.4–7.0)
Neutrophils: 39 %
Platelets: 316 10*3/uL (ref 150–450)
RBC: 4.48 x10E6/uL (ref 3.77–5.28)
RDW: 12 % (ref 11.7–15.4)
WBC: 7.8 10*3/uL (ref 3.4–10.8)

## 2022-06-29 NOTE — Progress Notes (Signed)
A1c is DE-creased from 8.3 to 7.3%. Continue to recommend balanced, lower carb meals. Smaller meal size, adding snacks. Choosing water as drink of choice and increasing purposeful exercise. Congrats! Keep up all the medications as previously ordered.   All other labs are normal/stable.  Calculated risk of heart attack/stroke is 15%. I continue to recommend diet low in saturated fat and regular exercise - 30 min at least 5 times per week  The 10-year ASCVD risk score (Arnett DK, et al., 2019) is: 15.1%   Values used to calculate the score:     Age: 67 years     Sex: Female     Is Non-Hispanic African American: Yes     Diabetic: Yes     Tobacco smoker: No     Systolic Blood Pressure: 123 mmHg     Is BP treated: Yes     HDL Cholesterol: 39 mg/dL     Total Cholesterol: 130 mg/dL   Jacky Kindle, FNP  [facility] [address] [place], [place] [address] [phone] (phone) [phone] (fax) [facility]

## 2022-07-26 ENCOUNTER — Other Ambulatory Visit: Payer: Self-pay | Admitting: Family Medicine

## 2022-07-26 DIAGNOSIS — E113299 Type 2 diabetes mellitus with mild nonproliferative diabetic retinopathy without macular edema, unspecified eye: Secondary | ICD-10-CM

## 2022-07-26 DIAGNOSIS — E119 Type 2 diabetes mellitus without complications: Secondary | ICD-10-CM

## 2022-07-26 DIAGNOSIS — I10 Essential (primary) hypertension: Secondary | ICD-10-CM

## 2022-07-26 DIAGNOSIS — E1169 Type 2 diabetes mellitus with other specified complication: Secondary | ICD-10-CM

## 2022-07-27 NOTE — Telephone Encounter (Signed)
Requested Prescriptions  Pending Prescriptions Disp Refills   lisinopril (ZESTRIL) 20 MG tablet [Pharmacy Med Name: Lisinopril 20 MG Oral Tablet] 180 tablet 0    Sig: Take 2 tablets by mouth once daily     Cardiovascular:  ACE Inhibitors Failed - 07/26/2022  8:02 PM      Failed - Cr in normal range and within 180 days    Creatinine  Date Value Ref Range Status  12/16/2011 0.61 0.60 - 1.30 mg/dL Final   Creatinine, Ser  Date Value Ref Range Status  06/28/2022 0.54 (L) 0.57 - 1.00 mg/dL Final         Passed - K in normal range and within 180 days    Potassium  Date Value Ref Range Status  06/28/2022 4.3 3.5 - 5.2 mmol/L Final  12/16/2011 3.5 3.5 - 5.1 mmol/L Final         Passed - Patient is not pregnant      Passed - Last BP in normal range    BP Readings from Last 1 Encounters:  06/28/22 123/77         Passed - Valid encounter within last 6 months    Recent Outpatient Visits           4 weeks ago Type 2 diabetes mellitus with other specified complication, unspecified whether long term insulin use (HCC)   [place] [facility] Merita Norton T, FNP   4 months ago Type 2 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, without long-term current use of insulin, unspecified laterality [facility])   [place] [facility] Merita Norton T, FNP   7 months ago Type 2 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, without long-term current use of insulin, unspecified laterality [facility])   [place] [facility] Merita Norton T, FNP   10 months ago Type 2 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, without long-term current use of insulin, unspecified laterality [facility])   [place] [facility] Jacky Kindle, FNP   1 year ago Annual physical exam   [facility] Merita Norton T, FNP       Future Appointments              In 2 months Jacky Kindle, FNP [facility]             atorvastatin (LIPITOR) 20 MG tablet [Pharmacy Med Name: Atorvastatin Calcium 20 MG Oral Tablet] 90 tablet 0    Sig: Take 1 tablet by mouth once daily     Cardiovascular:  Antilipid - Statins Failed - 07/26/2022  8:02 PM      Failed - Lipid Panel in normal range within the last 12 months    Cholesterol, Total  Date Value Ref Range Status  06/28/2022 130 100 - 199 mg/dL Final   LDL Chol Calc (NIH)  Date Value Ref Range Status  06/28/2022 71 0 - 99 mg/dL Final   HDL  Date Value Ref Range Status  06/28/2022 39 (L) >39 mg/dL Final   Triglycerides  Date Value Ref Range Status  06/28/2022 105 0 - 149 mg/dL Final         Passed - Patient is not pregnant      Passed - Valid encounter within last 12 months    Recent Outpatient Visits           4 weeks ago Type 2 diabetes mellitus  with other specified complication, unspecified whether long term insulin use (HCC)   [place] [facility] Merita Norton T, FNP   4 months ago Type 2 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, without long-term current use of insulin, unspecified laterality 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)   [place] [facility] Merita Norton T, FNP   10 months ago Type 2 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, without long-term current use of insulin, unspecified laterality [facility])   [place] [facility] Merita Norton T, FNP   1 year ago Annual physical exam   [facility] Jacky Kindle, FNP       Future Appointments             In 2 months Jacky Kindle, FNP  [place]             carvedilol (COREG) 6.25 MG tablet [Pharmacy Med Name: Carvedilol 6.25 MG Oral Tablet] 180 tablet 0    Sig: Take 1 tablet by mouth twice daily     Cardiovascular: Beta Blockers 3 Failed - 07/26/2022  8:02 PM      Failed - Cr in normal range and within 360 days    Creatinine  Date Value Ref Range Status  12/16/2011 0.61 0.60 - 1.30 mg/dL Final   Creatinine, Ser  Date Value Ref Range Status  06/28/2022 0.54 (L) 0.57 - 1.00 mg/dL Final         Passed - AST in normal range and within 360 days    AST  Date Value Ref Range Status  06/28/2022 13 0 - 40 IU/L Final   SGOT(AST)  Date Value Ref Range Status  12/16/2011 71 (H) 15 - 37 Unit/L Final         Passed - ALT in normal range and within 360 days    ALT  Date Value Ref Range Status  06/28/2022 11 0 - 32 IU/L Final   SGPT (ALT)  Date Value Ref Range Status  12/16/2011 47 12 - 78 U/L Final         Passed - Last BP in normal range    BP Readings from Last 1 Encounters:  06/28/22 123/77         Passed - Last Heart Rate in normal range    Pulse Readings from Last 1 Encounters:  06/28/22 86         Passed - Valid encounter within last 6 months    Recent Outpatient Visits           4 weeks ago Type 2 diabetes mellitus with other specified complication, unspecified whether long term insulin use (HCC)   [place] [facility] Merita Norton T, FNP   4 months ago Type 2 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, without long-term current use of insulin, unspecified laterality [facility])   [place] Merita Norton T, FNP   7 months ago Type 2 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, without long-term current use of insulin, unspecified laterality [facility])   [place] [facility] Merita Norton T, FNP   10 months ago Type 2 diabetes mellitus with  mild nonproliferative retinopathy without macular  edema, without long-term current use of insulin, unspecified laterality [facility])   [place] [facility] Merita Norton T, FNP   1 year ago Annual physical exam   [place] [facility] Jacky Kindle, FNP       Future Appointments             In 2 months Jacky Kindle, FNP [place] [facility], [facility]             metFORMIN (GLUCOPHAGE) 1000 MG tablet [Pharmacy Med Name: metFORMIN HCl 1000 MG Oral Tablet] 180 tablet 0    Sig: Take 1 tablet by mouth twice daily     Endocrinology:  Diabetes - Biguanides Failed - 07/26/2022  8:02 PM      Failed - Cr in normal range and within 360 days    Creatinine  Date Value Ref Range Status  12/16/2011 0.61 0.60 - 1.30 mg/dL Final   Creatinine, Ser  Date Value Ref Range Status  06/28/2022 0.54 (L) 0.57 - 1.00 mg/dL Final         Failed - B12 Level in normal range and within 720 days    No results found for: "VITAMINB12"       Passed - HBA1C is between 0 and 7.9 and within 180 days    Hgb A1c MFr Bld  Date Value Ref Range Status  06/28/2022 7.3 (H) 4.8 - 5.6 % Final    Comment:             Prediabetes: 5.7 - 6.4          Diabetes: >6.4          Glycemic control for adults with diabetes: <7.0          Passed - eGFR in normal range and within 360 days    EGFR (African American)  Date Value Ref Range Status  12/16/2011 >60  Final   GFR calc Af Amer  Date Value Ref Range Status  04/14/2020 117 >59 mL/min/1.73 Final    Comment:    **In accordance with recommendations from the NKF-ASN Task force,**   [facility] is in the process of updating its eGFR calculation to the   2021 CKD-EPI creatinine equation that estimates kidney function   without a race variable.    EGFR (Non-African Amer.)  Date Value Ref Range Status  12/16/2011 >60  Final    Comment:    eGFR values <49mL/min/1.73 m2 may be an indication of chronic kidney disease  (CKD). Calculated eGFR is useful in patients with stable renal function. The eGFR calculation will not be reliable in acutely ill patients when serum creatinine is changing rapidly. It is not useful in  patients on dialysis. The eGFR calculation may not be applicable to patients at the low and high extremes of body sizes, pregnant women, and vegetarians. POTASSIUM,AST - Slight hemolysis, interpret results with  - caution.    GFR calc non Af Amer  Date Value Ref Range Status  04/14/2020 102 >59 mL/min/1.73 Final   eGFR  Date Value Ref Range Status  06/28/2022 101 >59 mL/min/1.73 Final         Passed - Valid encounter within last 6 months    Recent Outpatient Visits           4 weeks ago Type 2 diabetes mellitus with other specified complication, unspecified whether long term insulin use [facility])   [place] [facility] Jacky Kindle, FNP   4 months  ago Type 2 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, without long-term current use of insulin, unspecified laterality (HCC)   [place] [facility] Merita Norton T, FNP   7 months ago Type 2 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, without long-term current use of insulin, unspecified laterality [facility])   [place] [facility] Merita Norton T, FNP   10 months ago Type 2 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, without long-term current use of insulin, unspecified laterality [facility])   [place] [facility] Merita Norton T, FNP   1 year ago Annual physical exam   [facility] Jacky Kindle, FNP       Future Appointments             In 2 months Jacky Kindle, FNP [facility]            Passed - CBC within normal limits and completed in the last 12 months    WBC  Date Value Ref Range Status   06/28/2022 7.8 3.4 - 10.8 x10E3/uL Final  12/16/2011 22.1 (H) 3.6 - 11.0 x10 3/mm 3 Final  09/07/2009 7.7 4.0 - 10.5 K/uL Final   RBC  Date Value Ref Range Status  06/28/2022 4.48 3.77 - 5.28 x10E6/uL Final  12/16/2011 4.45 3.80 - 5.20 X10 6/mm 3 Final  09/07/2009 4.74 3.87 - 5.11 MIL/uL Final   Hemoglobin  Date Value Ref Range Status  06/28/2022 12.9 11.1 - 15.9 g/dL Final   Hematocrit  Date Value Ref Range Status  06/28/2022 39.5 34.0 - 46.6 % Final   MCHC  Date Value Ref Range Status  06/28/2022 32.7 31.5 - 35.7 g/dL Final  82/95/6213 08.6 32.0 - 36.0 g/dL Final  57/84/6962 95.2 30.0 - 36.0 g/dL Final   [facility]  Date Value Ref Range Status  06/28/2022 28.8 26.6 - 33.0 pg Final  12/16/2011 28.3 26.0 - 34.0 pg Final  09/07/2009 28.4 26.0 - 34.0 pg Final   MCV  Date Value Ref Range Status  06/28/2022 88 79 - 97 fL Final  12/16/2011 86 80 - 100 fL Final   No results found for: "PLTCOUNTKUC", "LABPLAT", "POCPLA" RDW  Date Value Ref Range Status  06/28/2022 12.0 11.7 - 15.4 % Final  12/16/2011 13.6 11.5 - 14.5 % Final          JARDIANCE 25 MG TABS tablet [Pharmacy Med Name: Jardiance 25 MG Oral Tablet] 30 tablet 0    Sig: TAKE 1 TABLET BY MOUTH ONCE DAILY BEFORE BREAKFAST     Endocrinology:  Diabetes - SGLT2 Inhibitors Failed - 07/26/2022  8:02 PM      Failed - Cr in normal range and within 360 days    Creatinine  Date Value Ref Range Status  12/16/2011 0.61 0.60 - 1.30 mg/dL Final   Creatinine, Ser  Date Value Ref Range Status  06/28/2022 0.54 (L) 0.57 - 1.00 mg/dL Final         Passed - HBA1C is between 0 and 7.9 and within 180 days    Hgb A1c MFr Bld  Date Value Ref Range Status  06/28/2022 7.3 (H) 4.8 - 5.6 % Final    Comment:             Prediabetes: 5.7 - 6.4          Diabetes: >6.4          Glycemic control  for adults with diabetes: <7.0          Passed - eGFR in normal range and within 360 days    EGFR (African American)  Date Value Ref Range  Status  12/16/2011 >60  Final   GFR calc Af Amer  Date Value Ref Range Status  04/14/2020 117 >59 mL/min/1.73 Final    Comment:    **In accordance with recommendations from the NKF-ASN Task force,**   [facility] is in the process of updating its eGFR calculation to the   2021 CKD-EPI creatinine equation that estimates kidney function   without a race variable.    EGFR (Non-African Amer.)  Date Value Ref Range Status  12/16/2011 >60  Final    Comment:    eGFR values <46mL/min/1.73 m2 may be an indication of chronic kidney disease (CKD). Calculated eGFR is useful in patients with stable renal function. The eGFR calculation will not be reliable in acutely ill patients when serum creatinine is changing rapidly. It is not useful in  patients on dialysis. The eGFR calculation may not be applicable to patients at the low and high extremes of body sizes, pregnant women, and vegetarians. POTASSIUM,AST - Slight hemolysis, interpret results with  - caution.    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, without long-term current use of insulin, unspecified laterality [facility])   [place] [facility] Merita Norton T, FNP   7 months ago Type 2 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, without long-term current use of insulin, unspecified laterality [facility])   [place] [facility] Merita Norton T, FNP   10 months ago Type 2 diabetes  mellitus 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, without long-term current use of insulin, unspecified laterality (HCC)   [place] [facility] Jacky Kindle, FNP   1 year ago Annual physical exam   [place] [facility] Jacky Kindle, FNP       Future Appointments             In 2 months Jacky Kindle, FNP [facility]             glipiZIDE (GLUCOTROL) 10 MG tablet [Pharmacy Med Name: glipiZIDE 10 MG Oral Tablet] 180 tablet 0    Sig: TAKE 1 TABLET BY MOUTH TWICE DAILY, 30 MINUTES BEFORE A MEAL     Endocrinology:  Diabetes - Sulfonylureas Failed - 07/26/2022  8:02 PM      Failed - Cr in normal range and within 360 days    Creatinine  Date Value Ref Range Status  12/16/2011 0.61 0.60 - 1.30 mg/dL Final   Creatinine, Ser  Date Value Ref Range Status  06/28/2022 0.54 (L) 0.57 - 1.00 mg/dL Final         Passed - HBA1C is between 0 and 7.9 and within 180 days  Hgb A1c MFr Bld  Date Value Ref Range Status  06/28/2022 7.3 (H) 4.8 - 5.6 % Final    Comment:             Prediabetes: 5.7 - 6.4          Diabetes: >6.4          Glycemic control for adults with diabetes: <7.0          Passed - Valid encounter within last 6 months    Recent Outpatient Visits           4 weeks ago Type 2 diabetes mellitus with other specified complication, unspecified whether long term insulin use (HCC)   [place] [facility] Merita Norton T, FNP   4 months ago Type 2 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, without long-term current use of insulin, unspecified laterality [facility])   [place] [facility] Merita Norton T, FNP   7 months ago Type 2 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

## 2022-09-27 ENCOUNTER — Encounter: Payer: Self-pay | Admitting: Family Medicine

## 2022-09-27 ENCOUNTER — Ambulatory Visit (INDEPENDENT_AMBULATORY_CARE_PROVIDER_SITE_OTHER): Payer: Medicare Other | Admitting: Family Medicine

## 2022-09-27 DIAGNOSIS — E1169 Type 2 diabetes mellitus with other specified complication: Secondary | ICD-10-CM

## 2022-09-27 DIAGNOSIS — E1159 Type 2 diabetes mellitus with other circulatory complications: Secondary | ICD-10-CM | POA: Diagnosis not present

## 2022-09-27 DIAGNOSIS — E119 Type 2 diabetes mellitus without complications: Secondary | ICD-10-CM

## 2022-09-27 DIAGNOSIS — E785 Hyperlipidemia, unspecified: Secondary | ICD-10-CM

## 2022-09-27 DIAGNOSIS — I152 Hypertension secondary to endocrine disorders: Secondary | ICD-10-CM

## 2022-09-27 DIAGNOSIS — E113299 Type 2 diabetes mellitus with mild nonproliferative diabetic retinopathy without macular edema, unspecified eye: Secondary | ICD-10-CM | POA: Diagnosis not present

## 2022-09-27 DIAGNOSIS — M25531 Pain in right wrist: Secondary | ICD-10-CM | POA: Diagnosis not present

## 2022-09-27 DIAGNOSIS — I10 Essential (primary) hypertension: Secondary | ICD-10-CM

## 2022-09-27 LAB — POCT GLYCOSYLATED HEMOGLOBIN (HGB A1C): Hemoglobin A1C: 6.5 % — AB (ref 4.0–5.6)

## 2022-09-27 MED ORDER — GLIPIZIDE 10 MG PO TABS
10.0000 mg | ORAL_TABLET | Freq: Two times a day (BID) | ORAL | 3 refills | Status: AC
Start: 2022-09-27 — End: ?

## 2022-09-27 MED ORDER — EMPAGLIFLOZIN 25 MG PO TABS: 25.0000 mg | ORAL_TABLET | Freq: Every day | ORAL | 0 refills | Status: DC

## 2022-09-27 MED ORDER — MELOXICAM 7.5 MG PO TABS: 7.5000 mg | ORAL_TABLET | Freq: Every day | ORAL | 0 refills | Status: DC

## 2022-09-27 MED ORDER — CARVEDILOL 6.25 MG PO TABS
6.2500 mg | ORAL_TABLET | Freq: Two times a day (BID) | ORAL | 3 refills | Status: AC
Start: 2022-09-27 — End: ?

## 2022-09-27 MED ORDER — LISINOPRIL 40 MG PO TABS
40.0000 mg | ORAL_TABLET | Freq: Every day | ORAL | 3 refills | Status: DC
Start: 2022-09-27 — End: 2023-06-14

## 2022-09-27 MED ORDER — METFORMIN HCL 1000 MG PO TABS
1000.0000 mg | ORAL_TABLET | Freq: Two times a day (BID) | ORAL | 3 refills | Status: DC
Start: 2022-09-27 — End: 2023-07-04

## 2022-09-27 MED ORDER — LINAGLIPTIN 5 MG PO TABS
5.0000 mg | ORAL_TABLET | Freq: Every day | ORAL | 3 refills | Status: DC
Start: 2022-09-27 — End: 2023-11-06

## 2022-09-27 MED ORDER — ATORVASTATIN CALCIUM 20 MG PO TABS: 20.0000 mg | ORAL_TABLET | Freq: Every day | ORAL | 3 refills | Status: DC

## 2022-09-27 NOTE — Progress Notes (Signed)
Established patient visit   Patient: Chelsea Gomez   DOB: August 22, 1955   67 y.o. Female  MRN: [id number] Visit Date: 09/27/2022  Today's healthcare provider: Jacky Kindle, FNP   Introduced to nurse practitioner role and practice setting.  All questions answered.  Discussed provider/patient relationship and expectations.  Chief Complaint  Patient presents with   Medical Management of Chronic Issues    Patient is present for 3 month follow-up. Patient reports she is taking medications as prescribed with no s/e and tolerating well. She reports she does not check her sugar or blood pressure at home. She reports lower leg edema (may be over consuming watermelon).    Subjective    HPI HPI     Medical Management of Chronic Issues    Additional comments: Patient is present for 3 month follow-up. Patient reports she is taking medications as prescribed with no s/e and tolerating well. She reports she does not check her sugar or blood pressure at home. She reports lower leg edema (may be over consuming watermelon).       Last edited by Acey Lav, CMA on 09/27/2022  9:36 AM.      Medications: Outpatient Medications Prior to Visit  Medication Sig   acetaminophen (TYLENOL) 500 MG tablet Take 500 mg by mouth every 6 (six) hours as needed.   aspirin 81 MG tablet Take 81 mg by mouth daily.   CALCIUM-VITAMIN D PO Take 800 mg by mouth daily.   carboxymethylcellulose (REFRESH PLUS) 0.5 % SOLN 1 drop 3 (three) times daily as needed.   Charcoal Activated (ACTIVATED CHARCOAL PO) Take 1,040 mg by mouth daily.   fexofenadine (ALLEGRA) 180 MG tablet Take 180 mg by mouth daily.   glucose blood (ONETOUCH VERIO) test strip Use as instructed   glucose blood (ONETOUCH VERIO) test strip Use as instructed   ketorolac (ACULAR) 0.5 % ophthalmic solution    [DISCONTINUED] atorvastatin (LIPITOR) 20 MG tablet Take 1 tablet by mouth once daily   [DISCONTINUED] carvedilol (COREG) 6.25 MG tablet Take 1  tablet by mouth twice daily   [DISCONTINUED] glipiZIDE (GLUCOTROL) 10 MG tablet TAKE 1 TABLET BY MOUTH TWICE DAILY, 30 MINUTES BEFORE A MEAL   [DISCONTINUED] JARDIANCE 25 MG TABS tablet TAKE 1 TABLET BY MOUTH ONCE DAILY BEFORE BREAKFAST   [DISCONTINUED] lisinopril (ZESTRIL) 20 MG tablet Take 2 tablets by mouth once daily   [DISCONTINUED] meloxicam (MOBIC) 7.5 MG tablet Take 1 tablet by mouth once daily   [DISCONTINUED] metFORMIN (GLUCOPHAGE) 1000 MG tablet Take 1 tablet by mouth twice daily   [DISCONTINUED] TRADJENTA 5 MG TABS tablet Take 1 tablet by mouth once daily   No facility-administered medications prior to visit.    Review of Systems  Last CBC Lab Results  Component Value Date   WBC 7.8 06/28/2022   HGB 12.9 06/28/2022   HCT 39.5 06/28/2022   MCV 88 06/28/2022   MCH 28.8 06/28/2022   RDW 12.0 06/28/2022   PLT 316 06/28/2022   Last metabolic panel Lab Results  Component Value Date   GLUCOSE 89 06/28/2022   NA 140 06/28/2022   K 4.3 06/28/2022   CL 102 06/28/2022   CO2 21 06/28/2022   BUN 11 06/28/2022   CREATININE 0.54 (L) 06/28/2022   EGFR 101 06/28/2022   CALCIUM 9.7 06/28/2022   PHOS 3.5 02/16/2016   PROT 7.0 06/28/2022   ALBUMIN 4.2 06/28/2022   LABGLOB 2.8 06/28/2022   AGRATIO 1.5 06/28/2022   BILITOT  0.3 06/28/2022   ALKPHOS 73 06/28/2022   AST 13 06/28/2022   ALT 11 06/28/2022   ANIONGAP 12 12/16/2011   Last lipids Lab Results  Component Value Date   CHOL 130 06/28/2022   HDL 39 (L) 06/28/2022   LDLCALC 71 06/28/2022   TRIG 105 06/28/2022   CHOLHDL 3.3 06/28/2022   Last hemoglobin A1c Lab Results  Component Value Date   HGBA1C 6.5 (A) 09/27/2022     Objective    BP 114/70 (BP Location: Left Arm, Patient Position: Sitting, Cuff Size: Normal)   Pulse 81   Ht 5\' 2"  (1.575 m)   Wt 219 lb (99.3 kg)   SpO2 97%   BMI 40.06 kg/m   Physical Exam Vitals and nursing note reviewed.  Constitutional:      General: She is not in acute  distress.    Appearance: Normal appearance. She is obese. She is not ill-appearing, toxic-appearing or diaphoretic.  HENT:     Head: Normocephalic and atraumatic.  Cardiovascular:     Rate and Rhythm: Normal rate and regular rhythm.     Pulses: Normal pulses.     Heart sounds: Normal heart sounds. No murmur heard.    No friction rub. No gallop.  Pulmonary:     Effort: Pulmonary effort is normal. No respiratory distress.     Breath sounds: Normal breath sounds. No stridor. No wheezing, rhonchi or rales.  Chest:     Chest wall: No tenderness.  Musculoskeletal:        General: No swelling, tenderness, deformity or signs of injury. Normal range of motion.     Right lower leg: No edema.     Left lower leg: No edema.  Skin:    General: Skin is warm and dry.     Capillary Refill: Capillary refill takes less than 2 seconds.     Coloration: Skin is not jaundiced or pale.     Findings: No bruising, erythema, lesion or rash.  Neurological:     General: No focal deficit present.     Mental Status: She is alert and oriented to person, place, and time. Mental status is at baseline.     Cranial Nerves: No cranial nerve deficit.     Sensory: No sensory deficit.     Motor: No weakness.     Coordination: Coordination normal.  Psychiatric:        Mood and Affect: Mood normal.        Behavior: Behavior normal.        Thought Content: Thought content normal.        Judgment: Judgment normal.     Results for orders placed or performed in visit on 09/27/22  POCT glycosylated hemoglobin (Hb A1C)  Result Value Ref Range   Hemoglobin A1C 6.5 (A) 4.0 - 5.6 %   HbA1c POC (<> result, manual entry)     HbA1c, POC (prediabetic range)     HbA1c, POC (controlled diabetic range)      Assessment & Plan     Problem List Items Addressed This Visit       Cardiovascular and Mediastinum   Hypertension associated with diabetes (HCC)    Chronic, stable At goal  Continue coreg 6.25 mg BID and lisinopril  2-20mg  every day       Relevant Medications   empagliflozin (JARDIANCE) 25 MG TABS tablet   atorvastatin (LIPITOR) 20 MG tablet   carvedilol (COREG) 6.25 MG tablet   glipiZIDE (GLUCOTROL) 10 MG tablet  lisinopril (ZESTRIL) 40 MG tablet   metFORMIN (GLUCOPHAGE) 1000 MG tablet   linagliptin (TRADJENTA) 5 MG TABS tablet   Other Relevant Orders   POCT glycosylated hemoglobin (Hb A1C) (Completed)     Endocrine   Hyperlipidemia associated with type 2 diabetes mellitus (HCC)    Chronic, borderline LDL goal <70 recommend diet low in saturated fat and regular exercise - 30 min at least 5 times per week Continue lipitor 20 mg and work on diet/exercise      Relevant Medications   empagliflozin (JARDIANCE) 25 MG TABS tablet   atorvastatin (LIPITOR) 20 MG tablet   carvedilol (COREG) 6.25 MG tablet   glipiZIDE (GLUCOTROL) 10 MG tablet   lisinopril (ZESTRIL) 40 MG tablet   metFORMIN (GLUCOPHAGE) 1000 MG tablet   linagliptin (TRADJENTA) 5 MG TABS tablet   Other Relevant Orders   POCT glycosylated hemoglobin (Hb A1C) (Completed)   Type 2 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 Smaller meal size, adding snacks. Choosing water as drink of choice and increasing purposeful exercise. Continue glipizide 10 mg BID, jardiance 25 mg every day, metformin 1000 mg BID and tradjenta 5 mg QD      Relevant Medications   empagliflozin (JARDIANCE) 25 MG TABS tablet   atorvastatin (LIPITOR) 20 MG tablet   glipiZIDE (GLUCOTROL) 10 MG tablet   lisinopril (ZESTRIL) 40 MG tablet   metFORMIN (GLUCOPHAGE) 1000 MG tablet   linagliptin (TRADJENTA) 5 MG TABS tablet   Other Relevant Orders   POCT glycosylated hemoglobin (Hb A1C) (Completed)     Other   Morbid obesity (HCC) - Primary    Chronic, improved Body mass index is 40.06 kg/m. Discussed  importance of healthy weight management Discussed diet and exercise       Relevant Medications   empagliflozin (JARDIANCE) 25 MG TABS tablet   glipiZIDE (GLUCOTROL) 10 MG tablet   metFORMIN (GLUCOPHAGE) 1000 MG tablet   linagliptin (TRADJENTA) 5 MG TABS tablet   RESOLVED: Right wrist pain   Relevant Medications   meloxicam (MOBIC) 7.5 MG tablet   Return in about 3 months (around 12/28/2022) for chonic disease management.     Leilani Merl, FNP, have reviewed all documentation for this visit. The documentation on 10/04/22 for the exam, diagnosis, procedures, and orders are all accurate and complete.  Jacky Kindle, FNP  [facility] [phone] (phone) [phone] (fax)  [facility]

## 2022-09-27 NOTE — Patient Instructions (Signed)
The CDC recommends two doses of Shingrix (the new shingles vaccine) separated by 2 to 6 months for adults age 68 years and older. I recommend checking with your insurance plan regarding coverage for this vaccine.    

## 2022-10-02 ENCOUNTER — Other Ambulatory Visit: Payer: Self-pay | Admitting: Family Medicine

## 2022-10-03 NOTE — Telephone Encounter (Signed)
Requested by interface surescripts. Duplicate request. Patient picked up Rx refill 10/02/22 per pharmacy staff. Future visit in 3 months.  Requested Prescriptions  Refused Prescriptions Disp Refills   JARDIANCE 25 MG TABS tablet [Pharmacy Med Name: Jardiance 25 MG Oral Tablet] 30 tablet 0    Sig: TAKE 1 TABLET BY MOUTH ONCE DAILY BEFORE BREAKFAST     Endocrinology:  Diabetes - SGLT2 Inhibitors Failed - 10/02/2022 12:21 AM      Failed - Cr in normal range and within 360 days    Creatinine  Date Value Ref Range Status  12/16/2011 0.61 0.60 - 1.30 mg/dL Final   Creatinine, Ser  Date Value Ref Range Status  06/28/2022 0.54 (L) 0.57 - 1.00 mg/dL Final         Passed - HBA1C is between 0 and 7.9 and within 180 days    Hemoglobin A1C  Date Value Ref Range Status  09/27/2022 6.5 (A) 4.0 - 5.6 % Corrected   Hgb A1c MFr Bld  Date Value Ref Range Status  06/28/2022 7.3 (H) 4.8 - 5.6 % Final    Comment:             Prediabetes: 5.7 - 6.4          Diabetes: >6.4          Glycemic control for adults with diabetes: <7.0          Passed - eGFR in normal range and within 360 days    EGFR (African American)  Date Value Ref Range Status  12/16/2011 >60  Final   GFR calc Af Amer  Date Value Ref Range Status  04/14/2020 117 >59 mL/min/1.73 Final    Comment:    **In accordance with recommendations from the NKF-ASN Task force,**   [facility] is in the process of updating its eGFR calculation to the   2021 CKD-EPI creatinine equation that estimates kidney function   without a race variable.    EGFR (Non-African Amer.)  Date Value Ref Range Status  12/16/2011 >60  Final    Comment:    eGFR values <19mL/min/1.73 m2 may be an indication of chronic kidney disease (CKD). Calculated eGFR is useful in patients with stable renal function. The eGFR calculation will not be reliable in acutely ill patients when serum creatinine is changing rapidly. It is not useful in  patients on dialysis. The eGFR  calculation may not be applicable to patients at the low and high extremes of body sizes, pregnant women, and vegetarians. POTASSIUM,AST - Slight hemolysis, interpret results with  - caution.    GFR calc non Af Amer  Date Value Ref Range Status  04/14/2020 102 >59 mL/min/1.73 Final   eGFR  Date Value Ref Range Status  06/28/2022 101 >59 mL/min/1.73 Final         Passed - Valid encounter within last 6 months    Recent Outpatient Visits           6 days ago Morbid obesity [facility])   [place] [facility] Merita Norton T, FNP   3 months ago Type 2 diabetes mellitus with other specified complication, unspecified whether long term insulin use [facility])   [place] [facility] Merita Norton T, FNP   6 months ago Type 2 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, without long-term current use of insulin, unspecified laterality [facility])   [facility] Merita Norton T, FNP   9 months ago Type 2 diabetes mellitus  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, without long-term current use of insulin, unspecified laterality [facility])   [facility] Merita Norton T, FNP   1 year ago Type 2 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

## 2022-10-03 NOTE — Telephone Encounter (Signed)
Called pharmacy to verify that receipt confirmed by pharmacy 09/27/22 at 9:43 am . Pharmacy staff confirmed patient picked up Rx 10/02/22.

## 2022-10-04 ENCOUNTER — Encounter: Payer: Self-pay | Admitting: Family Medicine

## 2022-10-04 NOTE — Assessment & Plan Note (Signed)
Chronic, improved Body mass index is 40.06 kg/m. Discussed importance of healthy weight management Discussed diet and exercise

## 2022-10-04 NOTE — Assessment & Plan Note (Signed)
Chronic, improved At goal Continue to recommend balanced, lower carb meals. Smaller meal size, adding snacks. Choosing water as drink of choice and increasing purposeful exercise. Continue glipizide 10 mg BID, jardiance 25 mg every day, metformin 1000 mg BID and tradjenta 5 mg QD

## 2022-10-04 NOTE — Assessment & Plan Note (Signed)
Chronic, borderline LDL goal <70 recommend diet low in saturated fat and regular exercise - 30 min at least 5 times per week Continue lipitor 20 mg and work on diet/exercise

## 2022-10-04 NOTE — Assessment & Plan Note (Signed)
Chronic, stable At goal  Continue coreg 6.25 mg BID and lisinopril 2-20mg  every day

## 2022-11-23 ENCOUNTER — Other Ambulatory Visit: Payer: Self-pay | Admitting: Family Medicine

## 2022-11-23 DIAGNOSIS — E113299 Type 2 diabetes mellitus with mild nonproliferative diabetic retinopathy without macular edema, unspecified eye: Secondary | ICD-10-CM

## 2022-11-27 NOTE — Telephone Encounter (Signed)
Labs in date  Requested Prescriptions  Pending Prescriptions Disp Refills   empagliflozin (JARDIANCE) 25 MG TABS tablet [Pharmacy Med Name: Jardiance 25 MG Oral Tablet] 90 tablet 0    Sig: TAKE 1 TABLET BY MOUTH ONCE DAILY BEFORE BREAKFAST     Endocrinology:  Diabetes - SGLT2 Inhibitors Failed - 11/23/2022 11:09 PM      Failed - Cr in normal range and within 360 days    Creatinine  Date Value Ref Range Status  12/16/2011 0.61 0.60 - 1.30 mg/dL Final   Creatinine, Ser  Date Value Ref Range Status  06/28/2022 0.54 (L) 0.57 - 1.00 mg/dL Final         Passed - HBA1C is between 0 and 7.9 and within 180 days    Hemoglobin A1C  Date Value Ref Range Status  09/27/2022 6.5 (A) 4.0 - 5.6 % Corrected   Hgb A1c MFr Bld  Date Value Ref Range Status  06/28/2022 7.3 (H) 4.8 - 5.6 % Final    Comment:             Prediabetes: 5.7 - 6.4          Diabetes: >6.4          Glycemic control for adults with diabetes: <7.0          Passed - eGFR in normal range and within 360 days    EGFR (African American)  Date Value Ref Range Status  12/16/2011 >60  Final   GFR calc Af Amer  Date Value Ref Range Status  04/14/2020 117 >59 mL/min/1.73 Final    Comment:    **In accordance with recommendations from the NKF-ASN Task force,**   [facility] is in the process of updating its eGFR calculation to the   2021 CKD-EPI creatinine equation that estimates kidney function   without a race variable.    EGFR (Non-African Amer.)  Date Value Ref Range Status  12/16/2011 >60  Final    Comment:    eGFR values <46mL/min/1.73 m2 may be an indication of chronic kidney disease (CKD). Calculated eGFR is useful in patients with stable renal function. The eGFR calculation will not be reliable in acutely ill patients when serum creatinine is changing rapidly. It is not useful in  patients on dialysis. The eGFR calculation may not be applicable to patients at the low and high extremes of body sizes,  pregnant women, and vegetarians. POTASSIUM,AST - Slight hemolysis, interpret results with  - caution.    GFR calc non Af Amer  Date Value Ref Range Status  04/14/2020 102 >59 mL/min/1.73 Final   eGFR  Date Value Ref Range Status  06/28/2022 101 >59 mL/min/1.73 Final         Passed - Valid encounter within last 6 months    Recent Outpatient Visits           2 months ago Morbid obesity [facility])   [place] [facility] Merita Norton T, FNP   5 months ago Type 2 diabetes mellitus with other specified complication, unspecified whether long term insulin use [facility])   [place] [facility] Merita Norton T, FNP   8 months ago Type 2 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, without long-term current use of insulin, unspecified laterality [facility])   [facility] Merita Norton T, FNP   11 months ago Type 2 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

## 2022-12-09 LAB — HM DIABETES EYE EXAM

## 2022-12-11 ENCOUNTER — Encounter: Payer: Self-pay | Admitting: Family Medicine

## 2023-01-03 ENCOUNTER — Ambulatory Visit (INDEPENDENT_AMBULATORY_CARE_PROVIDER_SITE_OTHER): Payer: Medicare Other | Admitting: Family Medicine

## 2023-01-03 VITALS — BP 137/77 | HR 76 | Temp 97.8°F | Resp 16 | Ht 62.0 in | Wt 222.5 lb

## 2023-01-03 DIAGNOSIS — E113299 Type 2 diabetes mellitus with mild nonproliferative diabetic retinopathy without macular edema, unspecified eye: Secondary | ICD-10-CM | POA: Diagnosis not present

## 2023-01-03 DIAGNOSIS — E1159 Type 2 diabetes mellitus with other circulatory complications: Secondary | ICD-10-CM | POA: Diagnosis not present

## 2023-01-03 DIAGNOSIS — Z7984 Long term (current) use of oral hypoglycemic drugs: Secondary | ICD-10-CM

## 2023-01-03 DIAGNOSIS — Z23 Encounter for immunization: Secondary | ICD-10-CM | POA: Insufficient documentation

## 2023-01-03 DIAGNOSIS — I152 Hypertension secondary to endocrine disorders: Secondary | ICD-10-CM

## 2023-01-03 NOTE — Assessment & Plan Note (Signed)
Chronic, borderline Goal of 119/79 Continue dietary and lifestyle modification to assist medication -coreg 6.25 mg BID; jardiance 25; lisinopril 40

## 2023-01-03 NOTE — Assessment & Plan Note (Signed)
Chronic; repeat A1c Goal of <7% Continue to recommend balanced, lower carb meals. Smaller meal size, adding snacks. Choosing water as drink of choice and increasing purposeful exercise. Discussed option of 3 vs 6 month f/u Defer medication regimen changes -on ace -on statin -continue jardiance 25, glipizide 10 bid, tradjenta 5, metformin 1000 mg bid

## 2023-01-03 NOTE — Assessment & Plan Note (Signed)
Chronic, Body mass index is 40.7 kg/m. Discussed importance of healthy weight management Discussed diet and exercise

## 2023-01-03 NOTE — Progress Notes (Signed)
Established patient visit   Patient: Chelsea Gomez   DOB: 01-27-1956   67 y.o. Female  MRN: [id number] Visit Date: 01/03/2023  Today's healthcare provider: Jacky Kindle, FNP  Introduced to nurse practitioner role and practice setting.  All questions answered.  Discussed provider/patient relationship and expectations.  Subjective    Diabetes   HPI     Follow-up    Additional comments: And flu vaccine, pt brought meds in needs refills      Last edited by Clois Comber on 01/03/2023  9:02 AM.      Medications: Outpatient Medications Prior to Visit  Medication Sig Note   acetaminophen (TYLENOL) 500 MG tablet Take 500 mg by mouth every 6 (six) hours as needed.    aspirin 81 MG tablet Take 81 mg by mouth daily.    atorvastatin (LIPITOR) 20 MG tablet Take 1 tablet (20 mg total) by mouth daily.    CALCIUM-VITAMIN D PO Take 800 mg by mouth daily.    carvedilol (COREG) 6.25 MG tablet Take 1 tablet (6.25 mg total) by mouth 2 (two) times daily.    Charcoal Activated (ACTIVATED CHARCOAL PO) Take 1,040 mg by mouth daily.    empagliflozin (JARDIANCE) 25 MG TABS tablet TAKE 1 TABLET BY MOUTH ONCE DAILY BEFORE BREAKFAST    fexofenadine (ALLEGRA) 180 MG tablet Take 180 mg by mouth daily. 01/03/2023: As needed    glipiZIDE (GLUCOTROL) 10 MG tablet Take 1 tablet (10 mg total) by mouth 2 (two) times daily before a meal. TAKE 1 TABLET BY MOUTH TWICE DAILY 30 MINUTES BEFORE A MEAL    glucose blood (ONETOUCH VERIO) test strip Use as instructed    glucose blood (ONETOUCH VERIO) test strip Use as instructed    ketorolac (ACULAR) 0.5 % ophthalmic solution     linagliptin (TRADJENTA) 5 MG TABS tablet Take 1 tablet (5 mg total) by mouth daily.    lisinopril (ZESTRIL) 40 MG tablet Take 1 tablet (40 mg total) by mouth daily.    meloxicam (MOBIC) 7.5 MG tablet Take 1 tablet (7.5 mg total) by mouth daily.    metFORMIN (GLUCOPHAGE) 1000 MG tablet Take 1 tablet (1,000 mg total) by mouth 2 (two) times  daily.    [DISCONTINUED] carboxymethylcellulose (REFRESH PLUS) 0.5 % SOLN 1 drop 3 (three) times daily as needed. (Patient not taking: Reported on 01/03/2023)    No facility-administered medications prior to visit.     Objective    BP 137/77 (BP Location: Right Arm, Patient Position: Sitting, Cuff Size: Normal)   Pulse 76   Temp 97.8 F (36.6 C)   Resp 16   Ht 5\' 2"  (1.575 m)   Wt 222 lb 8 oz (100.9 kg)   SpO2 98%   BMI 40.70 kg/m   Physical Exam Vitals and nursing note reviewed.  Constitutional:      General: She is not in acute distress.    Appearance: Normal appearance. She is obese. She is not ill-appearing, toxic-appearing or diaphoretic.  HENT:     Head: Normocephalic and atraumatic.  Cardiovascular:     Rate and Rhythm: Normal rate and regular rhythm.     Pulses: Normal pulses.     Heart sounds: Normal heart sounds. No murmur heard.    No friction rub. No gallop.  Pulmonary:     Effort: Pulmonary effort is normal. No respiratory distress.     Breath sounds: Normal breath sounds. No stridor. No wheezing, rhonchi or rales.  Chest:  Chest wall: No tenderness.  Musculoskeletal:        General: No swelling, tenderness, deformity or signs of injury. Normal range of motion.     Right lower leg: No edema.     Left lower leg: No edema.  Skin:    General: Skin is warm and dry.     Capillary Refill: Capillary refill takes less than 2 seconds.     Coloration: Skin is not jaundiced or pale.     Findings: No bruising, erythema, lesion or rash.  Neurological:     General: No focal deficit present.     Mental Status: She is alert and oriented to person, place, and time. Mental status is at baseline.     Cranial Nerves: No cranial nerve deficit.     Sensory: No sensory deficit.     Motor: No weakness.     Coordination: Coordination normal.  Psychiatric:        Mood and Affect: Mood normal.        Behavior: Behavior normal.        Thought Content: Thought content normal.         Judgment: Judgment normal.     No results found for any visits on 01/03/23.  Assessment & Plan     Problem List Items Addressed This Visit       Cardiovascular and Mediastinum   Hypertension associated with diabetes (HCC)    Chronic, borderline Goal of 119/79 Continue dietary and lifestyle modification to assist medication -coreg 6.25 mg BID; jardiance 25; lisinopril 40        Relevant Orders   Hemoglobin A1c     Endocrine   Type 2 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, without long-term current use of insulin (HCC) - Primary    Chronic; repeat A1c Goal of <7% Continue to recommend balanced, lower carb meals. Smaller meal size, adding snacks. Choosing water as drink of choice and increasing purposeful exercise. Discussed option of 3 vs 6 month f/u Defer medication regimen changes -on ace -on statin -continue jardiance 25, glipizide 10 bid, tradjenta 5, metformin 1000 mg bid      Relevant Orders   Hemoglobin A1c     Other   Encounter for immunization   Relevant Orders   Flu Vaccine Trivalent High Dose (Fluad) (Completed)   Morbid obesity (HCC)    Chronic, Body mass index is 40.7 kg/m. Discussed importance of healthy weight management Discussed diet and exercise       Return in about 6 months (around 07/04/2023) for annual examination.     Leilani Merl, FNP, have reviewed all documentation for this visit. The documentation on 01/03/23 for the exam, diagnosis, procedures, and orders are all accurate and complete.  Jacky Kindle, FNP  [facility] [phone] (phone) [phone] (fax)  [facility]

## 2023-01-04 LAB — HEMOGLOBIN A1C
Est. average glucose Bld gHb Est-mCnc: 160 mg/dL
Hgb A1c MFr Bld: 7.2 % — ABNORMAL HIGH (ref 4.8–5.6)

## 2023-03-04 ENCOUNTER — Other Ambulatory Visit: Payer: Self-pay | Admitting: Family Medicine

## 2023-05-08 ENCOUNTER — Telehealth: Payer: Self-pay | Admitting: Family Medicine

## 2023-05-08 DIAGNOSIS — M25531 Pain in right wrist: Secondary | ICD-10-CM

## 2023-05-08 NOTE — Telephone Encounter (Signed)
 [facility] faxed refill request for the following medications:   meloxicam (MOBIC) 7.5 MG tablet    Please advise.

## 2023-05-09 MED ORDER — MELOXICAM 7.5 MG PO TABS: 7.5000 mg | ORAL_TABLET | Freq: Every day | ORAL | 0 refills | Status: AC

## 2023-06-14 ENCOUNTER — Telehealth: Payer: Self-pay | Admitting: Family Medicine

## 2023-06-14 ENCOUNTER — Other Ambulatory Visit: Payer: Self-pay

## 2023-06-14 DIAGNOSIS — E113299 Type 2 diabetes mellitus with mild nonproliferative diabetic retinopathy without macular edema, unspecified eye: Secondary | ICD-10-CM

## 2023-06-14 DIAGNOSIS — E1159 Type 2 diabetes mellitus with other circulatory complications: Secondary | ICD-10-CM

## 2023-06-14 MED ORDER — LISINOPRIL 40 MG PO TABS: 40.0000 mg | ORAL_TABLET | Freq: Every day | ORAL | 3 refills | Status: AC

## 2023-06-14 MED ORDER — EMPAGLIFLOZIN 25 MG PO TABS
25.0000 mg | ORAL_TABLET | Freq: Every day | ORAL | 0 refills | Status: DC
Start: 2023-06-14 — End: 2023-07-04

## 2023-06-14 NOTE — Telephone Encounter (Signed)
 [facility] pharmacy is requesting refill lisinopril (ZESTRIL) 20 MG tablet  & empagliflozin (JARDIANCE) 25 MG TABS tablet  Please advise

## 2023-06-18 NOTE — Progress Notes (Signed)
 [facility] Quality Team Note  Name: Chelsea Gomez Date of Birth: December 21, 1955 MRN: [id number] Date: 06/18/2023  [facility] Quality Team has reviewed this patient's chart, please see recommendations below:  [facility] Quality Other; (Patient due for KED labs and A1c. Patient needs eGFR, URINE Microalbumin/Creatinine Ratio done as well as Hemoglobin A1C. Patient has upcoming AWV 06/19/2023. Please address and schedule for completion for gap closure.)

## 2023-06-19 ENCOUNTER — Ambulatory Visit (INDEPENDENT_AMBULATORY_CARE_PROVIDER_SITE_OTHER): Payer: Medicare Other

## 2023-06-19 DIAGNOSIS — I152 Hypertension secondary to endocrine disorders: Secondary | ICD-10-CM | POA: Diagnosis not present

## 2023-06-19 DIAGNOSIS — E1159 Type 2 diabetes mellitus with other circulatory complications: Secondary | ICD-10-CM | POA: Diagnosis not present

## 2023-06-19 DIAGNOSIS — Z0001 Encounter for general adult medical examination with abnormal findings: Secondary | ICD-10-CM

## 2023-06-19 DIAGNOSIS — Z0000 Encounter for general adult medical examination without abnormal findings: Secondary | ICD-10-CM

## 2023-06-19 DIAGNOSIS — Z1231 Encounter for screening mammogram for malignant neoplasm of breast: Secondary | ICD-10-CM

## 2023-06-19 NOTE — Patient Instructions (Addendum)
 Ms. Bernales , Thank you for taking time to come for your Medicare Wellness Visit. I appreciate your ongoing commitment to your health goals. Please review the following plan we discussed and let me know if I can assist you in the future.   Referrals/Orders/Follow-Ups/Clinician Recommendations: MAMMOGRAM ORDERED, LABS ORDERED  You have an order for:  []   2D Mammogram  [x]   3D Mammogram  []   Bone Density     Please call for appointment:  [facility]  [address] [place] [address] [phone] [facility] [address] [place], [place] [address] [phone] [facility] [address] Tracey Friday [place], [place] [address] [phone]   Make sure to wear two-piece clothing.  No lotions, powders, or deodorants the day of the appointment. Make sure to bring picture ID and insurance card.  Bring list of medications you are currently taking including any supplements.   Schedule your [place] screening mammogram through MyChart!   Log into your MyChart account.  Go to 'Visit' (or 'Appointments' if on mobile App) --> Schedule an Appointment  Under 'Select a Reason for Visit' choose the Mammogram Screening option.  Complete the pre-visit questions and select the time and place that best fits your schedule.   This is a list of the screening recommended for you and due dates:  Health Maintenance  Topic Date Due   Zoster (Shingles) Vaccine (1 of 2) Never done   Mammogram  02/24/2022   COVID-19 Vaccine (4 - 2024-25 season) 11/05/2022   Yearly kidney health urinalysis for diabetes  03/30/2023   Yearly kidney function blood test for diabetes  06/28/2023   Complete foot exam   06/28/2023   Hemoglobin A1C  07/04/2023   Flu Shot  10/05/2023   Eye exam for diabetics  06/05/2024   Medicare Annual Wellness Visit  06/18/2024   DEXA scan (bone density measurement)   11/17/2026   Colon Cancer Screening  08/31/2027   DTaP/Tdap/Td vaccine (2 - Td or Tdap) 09/17/2028   Pneumonia Vaccine  Completed   Hepatitis C Screening  Completed   HPV Vaccine  Aged Out   Meningitis B Vaccine  Aged Out    Advanced directives: (ACP Link)Information on Advanced Care Planning can be found at [place]  Secretary of [facility] Advance Health Care Directives Advance Health Care Directives. [email or web address]   Next Medicare Annual Wellness Visit scheduled for next year: Yes   06/24/24 @ 11:30 am by phone

## 2023-06-19 NOTE — Progress Notes (Signed)
 Subjective:   Chelsea Gomez is a 68 y.o. who presents for a Medicare Wellness preventive visit.  Visit Complete: Virtual I connected with  [name] on 06/19/23 by a audio enabled telemedicine application and verified that I am speaking with the correct person using two identifiers.  Patient Location: Home  Provider Location: Office/Clinic  I discussed the limitations of evaluation and management by telemedicine. The patient expressed understanding and agreed to proceed.  Vital Signs: Because this visit was a virtual/telehealth visit, some criteria may be missing or patient reported. Any vitals not documented were not able to be obtained and vitals that have been documented are patient reported.  VideoDeclined- This patient declined Librarian, academic. Therefore the visit was completed with audio only.  Persons Participating in Visit: Patient.  AWV Questionnaire: No: Patient Medicare AWV questionnaire was not completed prior to this visit.  Cardiac Risk Factors include: advanced age (>108men, >34 women);dyslipidemia;hypertension;diabetes mellitus;obesity (BMI >30kg/m2);sedentary lifestyle     Objective:    There were no vitals filed for this visit. There is no height or weight on file to calculate BMI.     06/19/2023   11:08 AM 06/14/2022   11:21 AM 10/03/2021    7:36 AM 06/06/2021    2:11 PM 08/30/2020    9:14 AM 04/20/2017    7:30 AM 08/14/2014   11:18 AM  Advanced Directives  Does Patient Have a Medical Advance Directive? No No No No No No Yes  Type of Advance Directive       Healthcare Power of Attorney  Does patient want to make changes to medical advance directive?       No - Patient declined  Copy of Healthcare Power of Attorney in Chart?       No - copy requested  Would patient like information on creating a medical advance directive? No - Patient declined   No - Patient declined No - Patient declined      Current Medications  (verified) Outpatient Encounter Medications as of 06/19/2023  Medication Sig   acetaminophen (TYLENOL) 500 MG tablet Take 500 mg by mouth every 6 (six) hours as needed.   aspirin 81 MG tablet Take 81 mg by mouth daily.   atorvastatin (LIPITOR) 20 MG tablet Take 1 tablet (20 mg total) by mouth daily.   CALCIUM-VITAMIN D PO Take 800 mg by mouth daily.   carvedilol (COREG) 6.25 MG tablet Take 1 tablet (6.25 mg total) by mouth 2 (two) times daily.   Charcoal Activated (ACTIVATED CHARCOAL PO) Take 1,040 mg by mouth daily.   empagliflozin (JARDIANCE) 25 MG TABS tablet Take 1 tablet (25 mg total) by mouth daily before breakfast.   fexofenadine (ALLEGRA) 180 MG tablet Take 180 mg by mouth daily.   glipiZIDE (GLUCOTROL) 10 MG tablet Take 1 tablet (10 mg total) by mouth 2 (two) times daily before a meal. TAKE 1 TABLET BY MOUTH TWICE DAILY 30 MINUTES BEFORE A MEAL   glucose blood (ONETOUCH VERIO) test strip Use as instructed   glucose blood (ONETOUCH VERIO) test strip Use as instructed   ketorolac (ACULAR) 0.5 % ophthalmic solution    linagliptin (TRADJENTA) 5 MG TABS tablet Take 1 tablet (5 mg total) by mouth daily.   lisinopril (ZESTRIL) 20 MG tablet Take 2 tablets by mouth once daily   lisinopril (ZESTRIL) 40 MG tablet Take 1 tablet (40 mg total) by mouth daily.   meloxicam (MOBIC) 7.5 MG tablet Take 1 tablet (7.5 mg total)  by mouth daily.   metFORMIN (GLUCOPHAGE) 1000 MG tablet Take 1 tablet (1,000 mg total) by mouth 2 (two) times daily.   No facility-administered encounter medications on file as of 06/19/2023.    Allergies (verified) Mevacor [lovastatin]   History: Past Medical History:  Diagnosis Date   Arthritis    Left knee   Diabetes mellitus without complication (HCC)    GERD (gastroesophageal reflux disease)    H/O multiple trauma    Hyperlipidemia    Hypertension    Low back pain    Lung collapse    Shoulder pain    left   Thyroid disease    Wears partial dentures    Past  Surgical History:  Procedure Laterality Date   BLADDER SURGERY     CATARACT EXTRACTION, BILATERAL     CESAREAN SECTION     COLONOSCOPY WITH PROPOFOL N/A 04/20/2017   Procedure: COLONOSCOPY WITH PROPOFOL;  Surgeon: Wyline Mood, MD;  Location: [facility] ENDOSCOPY;  Service: Gastroenterology;  Laterality: N/A;   COLONOSCOPY WITH PROPOFOL N/A 08/30/2020   Procedure: COLONOSCOPY WITH PROPOFOL;  Surgeon: Midge Minium, MD;  Location: [facility];  Service: Endoscopy;  Laterality: N/A;   ESOPHAGOGASTRODUODENOSCOPY (EGD) WITH PROPOFOL N/A 10/03/2021   Procedure: ESOPHAGOGASTRODUODENOSCOPY (EGD) WITH PROPOFOL;  Surgeon: Wyline Mood, MD;  Location: [facility] ENDOSCOPY;  Service: Gastroenterology;  Laterality: N/A;   POLYPECTOMY N/A 08/30/2020   Procedure: POLYPECTOMY;  Surgeon: Midge Minium, MD;  Location: [facility];  Service: Endoscopy;  Laterality: N/A;   THYROID SURGERY     TOOTH EXTRACTION  03/06/2014   Family History  Problem Relation Age of Onset   Heart attack Mother    Stroke Mother    Diabetes Mother    Aneurysm Father    Breast cancer Sister    Diabetes Sister    Ovarian cancer Neg Hx    Colon cancer Neg Hx    Social History   Socioeconomic History   Marital status: Single    Spouse name: Not on file   Number of children: Not on file   Years of education: Not on file   Highest education level: Associate degree: academic program  Occupational History   Not on file  Tobacco Use   Smoking status: Former    Types: Cigarettes   Smokeless tobacco: Never  Vaping Use   Vaping status: Never Used  Substance and Sexual Activity   Alcohol use: No   Drug use: No   Sexual activity: Not Currently    Birth control/protection: Post-menopausal  Other Topics Concern   Not on file  Social History Narrative   Not on file   Social Drivers of Health   Financial Resource Strain: Medium Risk (06/19/2023)   Overall Financial Resource Strain (CARDIA)    Difficulty of Paying Living  Expenses: Somewhat hard  Food Insecurity: No Food Insecurity (06/19/2023)   Hunger Vital Sign    Worried About Running Out of Food in the Last Year: Never true    Ran Out of Food in the Last Year: Never true  Transportation Needs: No Transportation Needs (06/19/2023)   PRAPARE - Administrator, Civil Service (Medical): No    Lack of Transportation (Non-Medical): No  Physical Activity: Inactive (06/19/2023)   Exercise Vital Sign    Days of Exercise per Week: 0 days    Minutes of Exercise per Session: 0 min  Stress: No Stress Concern Present (06/19/2023)   [employer] of Occupational Health - Occupational Stress Questionnaire  Feeling of Stress : Not at all  Social Connections: Moderately Integrated (06/19/2023)   Social Connection and Isolation Panel [NHANES]    Frequency of Communication with Friends and Family: More than three times a week    Frequency of Social Gatherings with Friends and Family: More than three times a week    Attends Religious Services: More than 4 times per year    Active Member of [employer] or Organizations: Yes    Attends Engineer, structural: More than 4 times per year    Marital Status: Never married    Tobacco Counseling Counseling given: Not Answered    Clinical Intake:  Pre-visit preparation completed: Yes  Pain : No/denies pain     BMI - recorded: 40.6 Nutritional Status: BMI > 30  Obese Nutritional Risks: None Diabetes: Yes CBG done?: No Did pt. bring in CBG monitor from home?: No  Lab Results  Component Value Date   HGBA1C 7.2 (H) 01/03/2023   HGBA1C 6.5 (A) 09/27/2022   HGBA1C 7.3 (H) 06/28/2022     How often do you need to have someone help you when you read instructions, pamphlets, or other written materials from your doctor or pharmacy?: 1 - Never  Interpreter Needed?: No  Information entered by :: Dellie Fergusson, LPN   Activities of Daily Living    06/19/2023   11:09 AM 06/19/2023    9:43 AM  In your  present state of health, do you have any difficulty performing the following activities:  Hearing? 0 0  Vision? 0 0  Difficulty concentrating or making decisions? 0 0  Walking or climbing stairs? 0 0  Dressing or bathing? 0 0  Doing errands, shopping? 0 0  Preparing Food and eating ? N N  Using the Toilet? N N  In the past six months, have you accidently leaked urine? N Y  Do you have problems with loss of bowel control? N N  Managing your Medications? N N  Managing your Finances? N N  Housekeeping or managing your Housekeeping? N N    Patient Care Team: Normie Becton, FNP as PCP - General (Family Medicine) Reche Canales, OD (Optometry)  Indicate any recent Medical Services you may have received from other than [facility] providers in the past year (date may be approximate).     Assessment:   This is a routine wellness examination for [facility].  Hearing/Vision screen Hearing Screening - Comments:: NO AIDS Vision Screening - Comments:: READERS- DR.NICE   Goals Addressed             This Visit's Progress    DIET - INCREASE WATER INTAKE         Depression Screen     06/19/2023   11:05 AM 01/03/2023    9:05 AM 06/28/2022   10:31 AM 06/14/2022   11:13 AM 03/29/2022    9:48 AM 09/27/2021   11:04 AM 06/29/2021   10:03 AM  PHQ 2/9 Scores  PHQ - 2 Score 0 0 0 0 0 0 0  PHQ- 9 Score 0 1 0  0 0 0    Fall Risk     06/19/2023   11:09 AM 06/19/2023    9:43 AM 06/28/2022   10:30 AM 06/14/2022   11:11 AM 03/29/2022    9:48 AM  Fall Risk   Falls in the past year? 0 0 0 0 0  Number falls in past yr: 0 0 0 0 0  Injury with Fall? 0 0 0  0 0  Risk for fall due to : No Fall Risks  No Fall Risks No Fall Risks   Follow up Falls prevention discussed;Falls evaluation completed   Education provided;Falls prevention discussed     MEDICARE RISK AT HOME:  Medicare Risk at Home Any stairs in or around the home?: No If so, are there any without handrails?: No Home free of loose throw rugs in  walkways, pet beds, electrical cords, etc?: Yes Adequate lighting in your home to reduce risk of falls?: Yes Life alert?: No Use of a cane, walker or w/c?: No Grab bars in the bathroom?: Yes Shower chair or bench in shower?: Yes Elevated toilet seat or a handicapped toilet?: Yes  TIMED UP AND GO:  Was the test performed?  No  Cognitive Function: 6CIT completed        06/19/2023   11:10 AM 06/14/2022   11:31 AM  6CIT Screen  What Year? 0 points 0 points  What month? 0 points 0 points  What time? 0 points 0 points  Count back from 20 0 points 0 points  Months in reverse 0 points 0 points  Repeat phrase 0 points 0 points  Total Score 0 points 0 points    Immunizations Immunization History  Administered Date(s) Administered   Fluad Quad(high Dose 65+) 12/16/2020   Fluad Trivalent(High Dose 65+) 01/03/2023   Influenza, Seasonal, Injecte, Preservative Fre 12/07/2021   Influenza,inj,Quad PF,6+ Mos 12/19/2018   Moderna Sars-Covid-2 Vaccination 05/29/2019, 06/26/2019, 02/11/2020   PNEUMOCOCCAL CONJUGATE-20 06/29/2021   Pneumococcal Polysaccharide-23 12/17/2019   Tdap 09/18/2018    Screening Tests Health Maintenance  Topic Date Due   Zoster Vaccines- Shingrix (1 of 2) Never done   MAMMOGRAM  02/24/2022   COVID-19 Vaccine (4 - 2024-25 season) 11/05/2022   Diabetic kidney evaluation - Urine ACR  03/30/2023   Diabetic kidney evaluation - eGFR measurement  06/28/2023   FOOT EXAM  06/28/2023   HEMOGLOBIN A1C  07/04/2023   INFLUENZA VACCINE  10/05/2023   OPHTHALMOLOGY EXAM  06/05/2024   Medicare Annual Wellness (AWV)  06/18/2024   DEXA SCAN  11/17/2026   Colonoscopy  08/31/2027   DTaP/Tdap/Td (2 - Td or Tdap) 09/17/2028   Pneumonia Vaccine 38+ Years old  Completed   Hepatitis C Screening  Completed   HPV VACCINES  Aged Out   Meningococcal B Vaccine  Aged Out    Health Maintenance  Health Maintenance Due  Topic Date Due   Zoster Vaccines- Shingrix (1 of 2) Never done    MAMMOGRAM  02/24/2022   COVID-19 Vaccine (4 - 2024-25 season) 11/05/2022   Diabetic kidney evaluation - Urine ACR  03/30/2023   Diabetic kidney evaluation - eGFR measurement  06/28/2023   Health Maintenance Items Addressed: Labs Ordered:DM LABS; UP TO DATE ON SHOTS, DECLINES MORE COVID SHOTS; MAMMOGRAM ORDERED, COLONOSCOPY UP TO DATE, BONE DENSITY UP TO DATE  Additional Screening:  Vision Screening: Recommended annual ophthalmology exams for early detection of glaucoma and other disorders of the eye.  Dental Screening: Recommended annual dental exams for proper oral hygiene  Community Resource Referral / Chronic Care Management: CRR required this visit?  No   CCM required this visit?  No     Plan:     I have personally reviewed and noted the following in the patient's chart:   Medical and social history Use of alcohol, tobacco or illicit drugs  Current medications and supplements including opioid prescriptions. Patient is not currently taking opioid prescriptions. Functional ability  and status Nutritional status Physical activity Advanced directives List of other physicians Hospitalizations, surgeries, and ER visits in previous 12 months Vitals Screenings to include cognitive, depression, and falls Referrals and appointments  In addition, I have reviewed and discussed with patient certain preventive protocols, quality metrics, and best practice recommendations. A written personalized care plan for preventive services as well as general preventive health recommendations were provided to patient.     Pinky Bright, LPN   11/02/5619   After Visit Summary: (MyChart) Due to this being a telephonic visit, the after visit summary with patients personalized plan was offered to patient via MyChart   Notes:  LABS ORDERED, MAMMOGRAM ORDERED

## 2023-07-04 ENCOUNTER — Ambulatory Visit (INDEPENDENT_AMBULATORY_CARE_PROVIDER_SITE_OTHER): Admitting: Physician Assistant

## 2023-07-04 ENCOUNTER — Other Ambulatory Visit: Payer: Self-pay

## 2023-07-04 ENCOUNTER — Encounter: Payer: Medicare Other | Admitting: Family Medicine

## 2023-07-04 ENCOUNTER — Encounter: Payer: Self-pay | Admitting: Physician Assistant

## 2023-07-04 VITALS — BP 116/69 | HR 99 | Resp 16 | Ht 62.0 in | Wt 224.0 lb

## 2023-07-04 DIAGNOSIS — Z0001 Encounter for general adult medical examination with abnormal findings: Secondary | ICD-10-CM | POA: Diagnosis not present

## 2023-07-04 DIAGNOSIS — J302 Other seasonal allergic rhinitis: Secondary | ICD-10-CM

## 2023-07-04 DIAGNOSIS — E113299 Type 2 diabetes mellitus with mild nonproliferative diabetic retinopathy without macular edema, unspecified eye: Secondary | ICD-10-CM

## 2023-07-04 DIAGNOSIS — E1169 Type 2 diabetes mellitus with other specified complication: Secondary | ICD-10-CM

## 2023-07-04 DIAGNOSIS — Z0000 Encounter for general adult medical examination without abnormal findings: Secondary | ICD-10-CM

## 2023-07-04 DIAGNOSIS — M25473 Effusion, unspecified ankle: Secondary | ICD-10-CM

## 2023-07-04 MED ORDER — EMPAGLIFLOZIN 25 MG PO TABS
25.0000 mg | ORAL_TABLET | Freq: Every day | ORAL | 0 refills | Status: AC
Start: 2023-07-04 — End: ?

## 2023-07-04 MED ORDER — ATORVASTATIN CALCIUM 20 MG PO TABS: 20.0000 mg | ORAL_TABLET | Freq: Every day | ORAL | 3 refills | Status: AC

## 2023-07-04 MED ORDER — METFORMIN HCL 1000 MG PO TABS: 1000.0000 mg | ORAL_TABLET | Freq: Two times a day (BID) | ORAL | 3 refills | Status: AC
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
## 2023-07-04 NOTE — Progress Notes (Signed)
 Needs refill of Meloxicam . I can refill the others that are needed.

## 2023-07-06 LAB — MICROALBUMIN / CREATININE URINE RATIO
Creatinine, Urine: 74.3 mg/dL
Microalb/Creat Ratio: 17 mg/g{creat} (ref 0–29)
Microalbumin, Urine: 13 ug/mL

## 2023-07-06 LAB — COMPREHENSIVE METABOLIC PANEL WITH GFR
ALT: 13 IU/L (ref 0–32)
AST: 16 IU/L (ref 0–40)
Albumin: 4.6 g/dL (ref 3.9–4.9)
Alkaline Phosphatase: 89 IU/L (ref 44–121)
BUN/Creatinine Ratio: 18 (ref 12–28)
BUN: 10 mg/dL (ref 8–27)
Bilirubin Total: 0.4 mg/dL (ref 0.0–1.2)
CO2: 24 mmol/L (ref 20–29)
Calcium: 9.9 mg/dL (ref 8.7–10.3)
Chloride: 100 mmol/L (ref 96–106)
Creatinine, Ser: 0.56 mg/dL — ABNORMAL LOW (ref 0.57–1.00)
Globulin, Total: 3.1 g/dL (ref 1.5–4.5)
Glucose: 151 mg/dL — ABNORMAL HIGH (ref 70–99)
Potassium: 4.5 mmol/L (ref 3.5–5.2)
Sodium: 140 mmol/L (ref 134–144)
Total Protein: 7.7 g/dL (ref 6.0–8.5)
eGFR: 99 mL/min/{1.73_m2} (ref >59)

## 2023-07-06 LAB — LIPID PANEL
Chol/HDL Ratio: 3.7 ratio (ref 0.0–4.4)
Cholesterol, Total: 149 mg/dL (ref 100–199)
HDL: 40 mg/dL (ref >39)
LDL Chol Calc (NIH): 92 mg/dL (ref 0–99)
Triglycerides: 89 mg/dL (ref 0–149)
VLDL Cholesterol Cal: 17 mg/dL (ref 5–40)

## 2023-07-06 LAB — CBC WITH DIFFERENTIAL/PLATELET
Basophils Absolute: 0.1 10*3/uL (ref 0.0–0.2)
Basos: 1 %
EOS (ABSOLUTE): 0.1 10*3/uL (ref 0.0–0.4)
Eos: 1 %
Hematocrit: 42.3 % (ref 34.0–46.6)
Hemoglobin: 14.1 g/dL (ref 11.1–15.9)
Immature Grans (Abs): 0 10*3/uL (ref 0.0–0.1)
Immature Granulocytes: 1 %
Lymphocytes Absolute: 3.4 10*3/uL — ABNORMAL HIGH (ref 0.7–3.1)
Lymphs: 46 %
MCH: 29.5 pg (ref 26.6–33.0)
MCHC: 33.3 g/dL (ref 31.5–35.7)
MCV: 89 fL (ref 79–97)
Monocytes Absolute: 0.6 10*3/uL (ref 0.1–0.9)
Monocytes: 8 %
Neutrophils Absolute: 3.1 10*3/uL (ref 1.4–7.0)
Neutrophils: 43 %
Platelets: 329 10*3/uL (ref 150–450)
RBC: 4.78 x10E6/uL (ref 3.77–5.28)
RDW: 11.3 % — ABNORMAL LOW (ref 11.7–15.4)
WBC: 7.3 10*3/uL (ref 3.4–10.8)

## 2023-07-06 LAB — TSH: TSH: 1.41 u[IU]/mL (ref 0.450–4.500)

## 2023-07-06 LAB — HEMOGLOBIN A1C
Est. average glucose Bld gHb Est-mCnc: 148 mg/dL
Hgb A1c MFr Bld: 6.8 % — ABNORMAL HIGH (ref 4.8–5.6)

## 2023-07-09 ENCOUNTER — Encounter: Payer: Self-pay | Admitting: Physician Assistant

## 2023-08-20 ENCOUNTER — Encounter: Payer: Self-pay | Admitting: Physician Assistant

## 2023-10-24 NOTE — Progress Notes (Unsigned)
 Established patient visit  Patient: Chelsea Gomez   DOB: [birth year]   68 y.o. Female  MRN: [id number] Visit Date: 10/25/2023  Today's healthcare provider: Jolynn Spencer, PA-C   No chief complaint on file.  Subjective       Discussed the use of AI scribe software for clinical note transcription with the patient, who gave verbal consent to proceed.  History of Present Illness  Except elevated A1c less than 7, at goal.  Advised to continue current diabetic regimen, low-carb diet, and exercise Please check American diabetic Association website about low-carb diet We could discuss lab work in details during your follow-up Any questions please reach out to the office or message me on MyChart!        07/04/2023    1:21 PM 06/19/2023   11:05 AM 01/03/2023    9:05 AM  Depression screen PHQ 2/9  Decreased Interest 0 0 0  Down, Depressed, Hopeless 0 0 0  PHQ - 2 Score 0 0 0  Altered sleeping 1 0 0  Tired, decreased energy 0 0 1  Change in appetite 0 0 0  Feeling bad or failure about yourself  0 0 0  Trouble concentrating 0 0 0  Moving slowly or fidgety/restless 0 0 0  Suicidal thoughts 0 0 0  PHQ-9 Score 1 0 1  Difficult doing work/chores Not difficult at all Not difficult at all Not difficult at all      07/04/2023    1:22 PM  GAD 7 : Generalized Anxiety Score  Nervous, Anxious, on Edge 0  Control/stop worrying 0  Worry too much - different things 0  Trouble relaxing 0  Restless 0  Easily annoyed or irritable 0  Afraid - awful might happen 0  Total GAD 7 Score 0  Anxiety Difficulty Not difficult at all    Medications: Outpatient Medications Prior to Visit  Medication Sig   acetaminophen  (TYLENOL ) 500 MG tablet Take 500 mg by mouth every 6 (six) hours as needed.   aspirin 81 MG tablet Take 81 mg by mouth daily.   atorvastatin  (LIPITOR) 20 MG tablet Take 1 tablet (20 mg total) by mouth daily.   CALCIUM -VITAMIN D PO Take 800 mg by mouth daily.   carvedilol  (COREG )  6.25 MG tablet Take 1 tablet (6.25 mg total) by mouth 2 (two) times daily.   Charcoal Activated (ACTIVATED CHARCOAL PO) Take 1,040 mg by mouth daily.   empagliflozin  (JARDIANCE ) 25 MG TABS tablet Take 1 tablet (25 mg total) by mouth daily before breakfast.   fexofenadine (ALLEGRA) 180 MG tablet Take 180 mg by mouth daily.   glipiZIDE  (GLUCOTROL ) 10 MG tablet Take 1 tablet (10 mg total) by mouth 2 (two) times daily before a meal. TAKE 1 TABLET BY MOUTH TWICE DAILY 30 MINUTES BEFORE A MEAL   glucose blood (ONETOUCH VERIO) test strip Use as instructed   glucose blood (ONETOUCH VERIO) test strip Use as instructed   ketorolac (ACULAR) 0.5 % ophthalmic solution    linagliptin  (TRADJENTA ) 5 MG TABS tablet Take 1 tablet (5 mg total) by mouth daily.   lisinopril  (ZESTRIL ) 20 MG tablet Take 2 tablets by mouth once daily   lisinopril  (ZESTRIL ) 40 MG tablet Take 1 tablet (40 mg total) by mouth daily.   meloxicam  (MOBIC ) 7.5 MG tablet Take 1 tablet (7.5 mg total) by mouth daily.   metFORMIN  (GLUCOPHAGE ) 1000 MG tablet Take 1 tablet (1,000 mg total) by mouth 2 (two) times daily.   No facility-administered medications  prior to visit.    Review of Systems  All other systems reviewed and are negative.  All negative Except see HPI   {Insert previous labs (optional):23779} {See past labs  Heme  Chem  Endocrine  Serology  Results Review (optional):1}   Objective    There were no vitals taken for this visit. {Insert last BP/Wt (optional):23777}{See vitals history (optional):1}   Physical Exam Vitals reviewed.  Constitutional:      General: She is not in acute distress.    Appearance: Normal appearance. She is well-developed. She is not diaphoretic.  HENT:     Head: Normocephalic and atraumatic.  Eyes:     General: No scleral icterus.    Conjunctiva/sclera: Conjunctivae normal.  Neck:     Thyroid: No thyromegaly.  Cardiovascular:     Rate and Rhythm: Normal rate and regular rhythm.      Pulses: Normal pulses.     Heart sounds: Normal heart sounds. No murmur heard. Pulmonary:     Effort: Pulmonary effort is normal. No respiratory distress.     Breath sounds: Normal breath sounds. No wheezing, rhonchi or rales.  Musculoskeletal:     Cervical back: Neck supple.     Right lower leg: No edema.     Left lower leg: No edema.  Lymphadenopathy:     Cervical: No cervical adenopathy.  Skin:    General: Skin is warm and dry.     Findings: No rash.  Neurological:     Mental Status: She is alert and oriented to person, place, and time. Mental status is at baseline.  Psychiatric:        Mood and Affect: Mood normal.        Behavior: Behavior normal.      No results found for any visits on 10/25/23.      Assessment and Plan Assessment & Plan     No orders of the defined types were placed in this encounter.   No follow-ups on file.   The patient was advised to call back or seek an in-person evaluation if the symptoms worsen or if the condition fails to improve as anticipated.  I discussed the assessment and treatment plan with the patient. The patient was provided an opportunity to ask questions and all were answered. The patient agreed with the plan and demonstrated an understanding of the instructions.  I, Duc Crocket, PA-C have reviewed all documentation for this visit. The documentation on 10/25/2023  for the exam, diagnosis, procedures, and orders are all accurate and complete.  Jolynn Spencer, [facility], MMS [facility] [phone] (phone) [phone] (fax)  [facility]

## 2023-10-25 ENCOUNTER — Encounter: Payer: Self-pay | Admitting: Physician Assistant

## 2023-10-25 ENCOUNTER — Ambulatory Visit (INDEPENDENT_AMBULATORY_CARE_PROVIDER_SITE_OTHER): Admitting: Physician Assistant

## 2023-10-25 VITALS — BP 125/65 | HR 80 | Ht 62.0 in | Wt 223.7 lb

## 2023-10-25 DIAGNOSIS — R350 Frequency of micturition: Secondary | ICD-10-CM | POA: Diagnosis not present

## 2023-10-25 DIAGNOSIS — M25473 Effusion, unspecified ankle: Secondary | ICD-10-CM

## 2023-10-25 DIAGNOSIS — N3946 Mixed incontinence: Secondary | ICD-10-CM

## 2023-10-25 DIAGNOSIS — I152 Hypertension secondary to endocrine disorders: Secondary | ICD-10-CM

## 2023-10-25 DIAGNOSIS — J302 Other seasonal allergic rhinitis: Secondary | ICD-10-CM

## 2023-10-25 DIAGNOSIS — E1169 Type 2 diabetes mellitus with other specified complication: Secondary | ICD-10-CM | POA: Diagnosis not present

## 2023-10-25 DIAGNOSIS — E113299 Type 2 diabetes mellitus with mild nonproliferative diabetic retinopathy without macular edema, unspecified eye: Secondary | ICD-10-CM | POA: Diagnosis not present

## 2023-10-25 DIAGNOSIS — E785 Hyperlipidemia, unspecified: Secondary | ICD-10-CM

## 2023-10-25 DIAGNOSIS — E1159 Type 2 diabetes mellitus with other circulatory complications: Secondary | ICD-10-CM | POA: Diagnosis not present

## 2023-10-25 LAB — POCT URINALYSIS DIPSTICK
Bilirubin, UA: NEGATIVE
Blood, UA: NEGATIVE
Glucose, UA: POSITIVE — AB
Ketones, UA: NEGATIVE
Leukocytes, UA: NEGATIVE
Nitrite, UA: NEGATIVE
Protein, UA: NEGATIVE
Spec Grav, UA: 1.02 (ref 1.010–1.025)
Urobilinogen, UA: 0.2 U/dL
pH, UA: 6 (ref 5.0–8.0)

## 2023-10-27 LAB — HEMOGLOBIN A1C
Est. average glucose Bld gHb Est-mCnc: 157 mg/dL
Hgb A1c MFr Bld: 7.1 % — ABNORMAL HIGH (ref 4.8–5.6)

## 2023-10-27 LAB — MICROALBUMIN / CREATININE URINE RATIO
Creatinine, Urine: 61.9 mg/dL
Microalb/Creat Ratio: 19 mg/g{creat} (ref 0–29)
Microalbumin, Urine: 11.6 ug/mL

## 2023-10-27 LAB — BASIC METABOLIC PANEL WITH GFR
BUN/Creatinine Ratio: 22 (ref 12–28)
BUN: 13 mg/dL (ref 8–27)
CO2: 19 mmol/L — ABNORMAL LOW (ref 20–29)
Calcium: 9.6 mg/dL (ref 8.7–10.3)
Chloride: 105 mmol/L (ref 96–106)
Creatinine, Ser: 0.6 mg/dL (ref 0.57–1.00)
Glucose: 164 mg/dL — ABNORMAL HIGH (ref 70–99)
Potassium: 4.3 mmol/L (ref 3.5–5.2)
Sodium: 141 mmol/L (ref 134–144)
eGFR: 97 mL/min/1.73 (ref >59)

## 2023-10-27 LAB — URINE CULTURE

## 2023-10-29 ENCOUNTER — Ambulatory Visit: Payer: Self-pay | Admitting: Physician Assistant

## 2023-11-06 ENCOUNTER — Telehealth: Payer: Self-pay | Admitting: Physician Assistant

## 2023-11-06 DIAGNOSIS — E113299 Type 2 diabetes mellitus with mild nonproliferative diabetic retinopathy without macular edema, unspecified eye: Secondary | ICD-10-CM

## 2023-11-06 MED ORDER — LINAGLIPTIN 5 MG PO TABS: 5.0000 mg | ORAL_TABLET | Freq: Every day | ORAL | 3 refills | Status: AC

## 2023-11-06 NOTE — Telephone Encounter (Signed)
 [facility] [address] is requesting refills on Tradjenta  5 mg. #100

## 2023-12-12 ENCOUNTER — Encounter: Payer: Self-pay | Admitting: Dietician

## 2023-12-12 ENCOUNTER — Encounter: Attending: Physician Assistant | Admitting: Dietician

## 2023-12-12 VITALS — Ht 62.0 in | Wt 221.2 lb

## 2023-12-12 DIAGNOSIS — Z6841 Body Mass Index (BMI) 40.0 and over, adult: Secondary | ICD-10-CM | POA: Diagnosis not present

## 2023-12-12 DIAGNOSIS — E113299 Type 2 diabetes mellitus with mild nonproliferative diabetic retinopathy without macular edema, unspecified eye: Secondary | ICD-10-CM | POA: Diagnosis present

## 2023-12-12 DIAGNOSIS — E119 Type 2 diabetes mellitus without complications: Secondary | ICD-10-CM | POA: Diagnosis present

## 2023-12-12 DIAGNOSIS — Z713 Dietary counseling and surveillance: Secondary | ICD-10-CM | POA: Insufficient documentation

## 2023-12-12 NOTE — Progress Notes (Signed)
 Diabetes Self-Management Education  Visit Type: First/Initial  Appt. Start Time: 0950 Appt. End Time: 1130  12/12/2023  [name], identified by name and date of birth, is a 68 y.o. female with a diagnosis of Diabetes: Type 2.   ASSESSMENT  Height 5' 2 (1.575 m), weight 221 lb 3.2 oz (100.3 kg). Body mass index is 40.46 kg/m.   Diabetes Self-Management Education - 12/12/23 1020       Visit Information   Visit Type First/Initial      Initial Visit   Diabetes Type Type 2    Date Diagnosed 1994      Psychosocial Assessment   Patient Belief/Attitude about Diabetes Motivated to manage diabetes    How often do you need to have someone help you when you read instructions, pamphlets, or other written materials from your doctor or pharmacy? 1 - Never    What is the last grade level you completed in school? 12      Pre-Education Assessment   Patient understands the diabetes disease and treatment process. Needs Instruction    Patient understands incorporating nutritional management into lifestyle. Needs Instruction    Patient undertands incorporating physical activity into lifestyle. Needs Instruction    Patient understands using medications safely. Comprehends key points    Patient understands monitoring blood glucose, interpreting and using results Comprehends key points    Patient understands prevention, detection, and treatment of acute complications. Comprehends key points    Patient understands prevention, detection, and treatment of chronic complications. Needs Instruction    Patient understands how to develop strategies to address psychosocial issues. Needs Instruction    Patient understands how to develop strategies to promote health/change behavior. Needs Review      Complications   Last HgB A1C per patient/outside source 7.1 %    How often do you check your blood sugar? 0 times/day (not testing)    Number of hypoglycemic episodes per month 0    Have you had a  dilated eye exam in the past 12 months? Yes    Have you had a dental exam in the past 12 months? Yes    Are you checking your feet? Yes    How many days per week are you checking your feet? 7      Dietary Intake   Breakfast pkg peanut butter crackers, coffee    Snack (morning) cereal; small sandwich    Lunch healthy frozen meal (banquet); leftovers, out only rarely    Snack (afternoon) fruit; sweet roll    Dinner 5:30 - 8:30pm soup/ sandwich with peanut butter and jelly    Snack (evening) usually avoids eating after 9pm    Beverage(s) water , juice; maybe one diet soda (8oz)      Activity / Exercise   Activity / Exercise Type ADL's      Patient Education   Previous Diabetes Education No    Disease Pathophysiology Definition of diabetes, type 1 and 2, and the diagnosis of diabetes;Explored patient's options for treatment of their diabetes    Healthy Eating Role of diet in the treatment of diabetes and the relationship between the three main macronutrients and blood glucose level;Food label reading, portion sizes and measuring food.;Plate Method;Reviewed blood glucose goals for pre and post meals and how to evaluate the patients' food intake on their blood glucose level.;Meal timing in regards to the patients' current diabetes medication.;Meal options for control of blood glucose level and chronic complications.;Other (comment)   sodium, saturated fat   Being  Active Role of exercise on diabetes management, blood pressure control and cardiac health.    Monitoring Purpose and frequency of SMBG.;Taught/discussed recording of test results and interpretation of SMBG.;Interpreting lab values - A1C, lipid, urine microalbumina.;Identified appropriate SMBG and/or A1C goals.    Acute complications Taught prevention, symptoms, and  treatment of hypoglycemia - the 15 rule.;Discussed and identified patients' prevention, symptoms, and treatment of hyperglycemia.    Chronic complications Relationship between  chronic complications and blood glucose control;Lipid levels, blood glucose control and heart disease    Diabetes Stress and Support Role of stress on diabetes      Individualized Goals (developed by patient)   Nutrition General guidelines for healthy choices and portions discussed      Outcomes   Expected Outcomes Demonstrated interest in learning. Expect positive outcomes    Future DMSE 3-4 months   patient requested January appt         Individualized Plan for Diabetes Self-Management Training:   Learning Objective:  Patient will have a greater understanding of diabetes self-management. Patient education plan is to attend individual and/or group sessions per assessed needs and concerns.   Plan:   Patient Instructions  Make sure to eat a high protein food with each meal; OK to have up to 1 protein shake daily to help supplement meals.  Try a lower sugar juice such as Ocean Spray Diet 5, or Healthy Balance juice.  Choose reduced sodium soup or broth, add extra cooked chicken (low sodium) or beans, fresh or frozen veggies for more filling and healthier soup  Expected Outcomes:  Demonstrated interest in learning. Expect positive outcomes  Education material provided: ADA - How to Thrive: A Guide for Your Journey with Diabetes and My Plate with food lists; General Nutrition Guidelines for Diabetes  If problems or questions, patient to contact team via:  Phone  Future DSME appointment: 3-4 months (patient requested January appt) 03/19/24

## 2023-12-12 NOTE — Patient Instructions (Signed)
 Make sure to eat a high protein food with each meal; OK to have up to 1 protein shake daily to help supplement meals.  Try a lower sugar juice such as Ocean Spray Diet 5, or Healthy Balance juice.  Choose reduced sodium soup or broth, add extra cooked chicken (low sodium) or beans, fresh or frozen veggies for more filling and healthier soup

## 2024-01-07 ENCOUNTER — Telehealth: Payer: Self-pay

## 2024-01-07 NOTE — Telephone Encounter (Signed)
**Note De-identified  [place] Obfuscation** Please advise 

## 2024-01-07 NOTE — Telephone Encounter (Signed)
 Copied from CRM [id number]. Topic: Clinical - Medication Question >> Jan 07, 2024 10:36 AM Cleave MATSU wrote: Reason for CRM: pt said there's been a recall on atorvastatin  and she wants to know if she should take something else

## 2024-01-25 ENCOUNTER — Encounter: Payer: Self-pay | Admitting: Physician Assistant

## 2024-01-25 ENCOUNTER — Ambulatory Visit (INDEPENDENT_AMBULATORY_CARE_PROVIDER_SITE_OTHER): Admitting: Physician Assistant

## 2024-01-25 VITALS — BP 113/59 | HR 81 | Resp 16 | Ht 62.0 in | Wt 224.3 lb

## 2024-01-25 DIAGNOSIS — G8929 Other chronic pain: Secondary | ICD-10-CM

## 2024-01-25 DIAGNOSIS — E1169 Type 2 diabetes mellitus with other specified complication: Secondary | ICD-10-CM

## 2024-01-25 DIAGNOSIS — E113299 Type 2 diabetes mellitus with mild nonproliferative diabetic retinopathy without macular edema, unspecified eye: Secondary | ICD-10-CM

## 2024-01-25 DIAGNOSIS — E1159 Type 2 diabetes mellitus with other circulatory complications: Secondary | ICD-10-CM

## 2024-01-25 DIAGNOSIS — Z23 Encounter for immunization: Secondary | ICD-10-CM

## 2024-01-25 NOTE — Progress Notes (Unsigned)
 Established patient visit  Patient: Chelsea Gomez   DOB: 1955-04-26   68 y.o. Female  MRN: [id number] Visit Date: 01/25/2024  Today's healthcare provider: Jolynn Spencer, PA-C   Chief Complaint  Patient presents with   Medical Management of Chronic Issues    3 month f/u  Discuss Atorvastatin : has a recall on medication.   Back Pain    Back pin has worsen otc: tylenol  BID   Weight Loss    Losing weight without trying   Subjective     HPI     Medical Management of Chronic Issues    Additional comments: 3 month f/u  Discuss Atorvastatin : has a recall on medication.        Back Pain    Additional comments: Back pin has worsen otc: tylenol  BID        Weight Loss    Additional comments: Losing weight without trying      Last edited by Wilfred Hargis RAMAN, CMA on 01/25/2024  9:06 AM.       Discussed the use of AI scribe software for clinical note transcription with the patient, who gave verbal consent to proceed.  History of Present Illness        12/12/2023   10:11 AM 07/04/2023    1:21 PM 06/19/2023   11:05 AM  Depression screen PHQ 2/9  Decreased Interest 0 0 0  Down, Depressed, Hopeless 0 0 0  PHQ - 2 Score 0 0 0  Altered sleeping  1 0  Tired, decreased energy  0 0  Change in appetite  0 0  Feeling bad or failure about yourself   0 0  Trouble concentrating  0 0  Moving slowly or fidgety/restless  0 0  Suicidal thoughts  0 0  PHQ-9 Score  1  0   Difficult doing work/chores  Not difficult at all Not difficult at all     Data saved with a previous flowsheet row definition      07/04/2023    1:22 PM  GAD 7 : Generalized Anxiety Score  Nervous, Anxious, on Edge 0  Control/stop worrying 0  Worry too much - different things 0  Trouble relaxing 0  Restless 0  Easily annoyed or irritable 0  Afraid - awful might happen 0  Total GAD 7 Score 0  Anxiety Difficulty Not difficult at all    Medications: Outpatient Medications Prior to Visit  Medication  Sig   acetaminophen  (TYLENOL ) 500 MG tablet Take 500 mg by mouth every 6 (six) hours as needed.   aspirin 81 MG tablet Take 81 mg by mouth daily.   atorvastatin  (LIPITOR) 20 MG tablet Take 1 tablet (20 mg total) by mouth daily.   CALCIUM -VITAMIN D PO Take 800 mg by mouth daily.   carvedilol  (COREG ) 6.25 MG tablet Take 1 tablet (6.25 mg total) by mouth 2 (two) times daily.   Charcoal Activated (ACTIVATED CHARCOAL PO) Take 1,040 mg by mouth daily.   cholecalciferol (VITAMIN D3) 25 MCG (1000 UNIT) tablet Take 1,000 Units by mouth daily.   empagliflozin  (JARDIANCE ) 25 MG TABS tablet Take 1 tablet (25 mg total) by mouth daily before breakfast.   fexofenadine (ALLEGRA) 180 MG tablet Take 180 mg by mouth daily.   glipiZIDE  (GLUCOTROL ) 10 MG tablet Take 1 tablet (10 mg total) by mouth 2 (two) times daily before a meal. TAKE 1 TABLET BY MOUTH TWICE DAILY 30 MINUTES BEFORE A MEAL   glucose blood (ONETOUCH VERIO) test strip Use as  instructed   glucose blood (ONETOUCH VERIO) test strip Use as instructed   linagliptin  (TRADJENTA ) 5 MG TABS tablet Take 1 tablet (5 mg total) by mouth daily.   lisinopril  (ZESTRIL ) 20 MG tablet Take 2 tablets by mouth once daily   lisinopril  (ZESTRIL ) 40 MG tablet Take 1 tablet (40 mg total) by mouth daily.   meloxicam  (MOBIC ) 7.5 MG tablet Take 1 tablet (7.5 mg total) by mouth daily.   metFORMIN  (GLUCOPHAGE ) 1000 MG tablet Take 1 tablet (1,000 mg total) by mouth 2 (two) times daily.   [DISCONTINUED] amoxicillin (AMOXIL) 500 MG capsule Take 500 mg by mouth 4 (four) times daily.   [DISCONTINUED] ketorolac (ACULAR) 0.5 % ophthalmic solution  (Patient not taking: Reported on 12/12/2023)   No facility-administered medications prior to visit.    Review of Systems All negative Except see HPI   {Insert previous labs (optional):23779} {See past labs  Heme  Chem  Endocrine  Serology  Results Review (optional):1}   Objective    BP (!) 113/59   Pulse 81   Resp 16   Ht 5'  2 (1.575 m)   Wt 224 lb 4.8 oz (101.7 kg)   SpO2 99%   BMI 41.03 kg/m  {Insert last BP/Wt (optional):23777}{See vitals history (optional):1}   Physical Exam   No results found for any visits on 01/25/24.      Assessment and Plan Assessment & Plan     Orders Placed This Encounter  Procedures   Flu vaccine HIGH DOSE PF(Fluzone Trivalent)    No follow-ups on file.   The patient was advised to call back or seek an in-person evaluation if the symptoms worsen or if the condition fails to improve as anticipated.  I discussed the assessment and treatment plan with the patient. The patient was provided an opportunity to ask questions and all were answered. The patient agreed with the plan and demonstrated an understanding of the instructions.  I, Niyonna Betsill, PA-C have reviewed all documentation for this visit. The documentation on 01/25/2024  for the exam, diagnosis, procedures, and orders are all accurate and complete.  Jolynn Spencer, [facility], MMS [facility] [phone] (phone) [phone] (fax)  [facility]

## 2024-01-26 DIAGNOSIS — Z23 Encounter for immunization: Secondary | ICD-10-CM | POA: Insufficient documentation

## 2024-01-26 LAB — LIPID PANEL
Chol/HDL Ratio: 3.6 ratio (ref 0.0–4.4)
Cholesterol, Total: 128 mg/dL (ref 100–199)
HDL: 36 mg/dL — ABNORMAL LOW (ref >39)
LDL Chol Calc (NIH): 73 mg/dL (ref 0–99)
Triglycerides: 99 mg/dL (ref 0–149)
VLDL Cholesterol Cal: 19 mg/dL (ref 5–40)

## 2024-01-26 LAB — COMPREHENSIVE METABOLIC PANEL WITH GFR
ALT: 11 IU/L (ref 0–32)
AST: 15 IU/L (ref 0–40)
Albumin: 4.2 g/dL (ref 3.9–4.9)
Alkaline Phosphatase: 79 IU/L (ref 49–135)
BUN/Creatinine Ratio: 25 (ref 12–28)
BUN: 15 mg/dL (ref 8–27)
Bilirubin Total: 0.3 mg/dL (ref 0.0–1.2)
CO2: 23 mmol/L (ref 20–29)
Calcium: 9.6 mg/dL (ref 8.7–10.3)
Chloride: 103 mmol/L (ref 96–106)
Creatinine, Ser: 0.59 mg/dL (ref 0.57–1.00)
Globulin, Total: 2.6 g/dL (ref 1.5–4.5)
Glucose: 126 mg/dL — ABNORMAL HIGH (ref 70–99)
Potassium: 4.5 mmol/L (ref 3.5–5.2)
Sodium: 140 mmol/L (ref 134–144)
Total Protein: 6.8 g/dL (ref 6.0–8.5)
eGFR: 97 mL/min/1.73 (ref >59)

## 2024-01-26 LAB — HEMOGLOBIN A1C
Est. average glucose Bld gHb Est-mCnc: 180 mg/dL
Hgb A1c MFr Bld: 7.9 % — ABNORMAL HIGH (ref 4.8–5.6)

## 2024-01-28 ENCOUNTER — Ambulatory Visit: Payer: Self-pay | Admitting: Physician Assistant

## 2024-01-28 NOTE — Progress Notes (Signed)
 And regular exercise

## 2024-01-29 LAB — URINALYSIS, MICROSCOPIC ONLY
Bacteria, UA: NONE SEEN
Casts: NONE SEEN /LPF
RBC, Urine: NONE SEEN /HPF (ref 0–2)

## 2024-01-29 LAB — SPECIMEN STATUS REPORT

## 2024-03-19 ENCOUNTER — Ambulatory Visit: Admitting: Dietician

## 2024-05-01 ENCOUNTER — Ambulatory Visit: Admitting: Physician Assistant

## 2024-06-24 ENCOUNTER — Ambulatory Visit
